# Patient Record
Sex: Female | Born: 1950 | Race: White | Hispanic: No | Smoking: Never smoker
Health system: Southern US, Community
[De-identification: ages and names within clinical notes are randomized; demographics above are authoritative.]

## PROBLEM LIST (undated history)

## (undated) DIAGNOSIS — F32A Depression, unspecified: Secondary | ICD-10-CM

## (undated) DIAGNOSIS — F419 Anxiety disorder, unspecified: Secondary | ICD-10-CM

## (undated) DIAGNOSIS — F329 Major depressive disorder, single episode, unspecified: Secondary | ICD-10-CM

## (undated) DIAGNOSIS — M1990 Unspecified osteoarthritis, unspecified site: Secondary | ICD-10-CM

## (undated) DIAGNOSIS — I10 Essential (primary) hypertension: Secondary | ICD-10-CM

## (undated) DIAGNOSIS — R7303 Prediabetes: Secondary | ICD-10-CM

## (undated) HISTORY — PX: HAND SURGERY: SHX662

## (undated) HISTORY — PX: OTHER SURGICAL HISTORY: SHX169

## (undated) HISTORY — PX: KNEE SURGERY: SHX244

---

## 2000-05-09 ENCOUNTER — Ambulatory Visit (HOSPITAL_COMMUNITY): Admission: RE | Admit: 2000-05-09 | Discharge: 2000-05-09 | Payer: Self-pay | Admitting: Pulmonary Disease

## 2000-05-28 ENCOUNTER — Ambulatory Visit (HOSPITAL_COMMUNITY): Admission: RE | Admit: 2000-05-28 | Discharge: 2000-05-28 | Payer: Self-pay | Admitting: Obstetrics and Gynecology

## 2000-05-28 ENCOUNTER — Encounter: Payer: Self-pay | Admitting: Obstetrics and Gynecology

## 2009-02-25 ENCOUNTER — Ambulatory Visit (HOSPITAL_COMMUNITY): Admission: RE | Admit: 2009-02-25 | Discharge: 2009-02-25 | Payer: Self-pay | Admitting: Ophthalmology

## 2009-03-11 ENCOUNTER — Ambulatory Visit (HOSPITAL_COMMUNITY): Admission: RE | Admit: 2009-03-11 | Discharge: 2009-03-11 | Payer: Self-pay | Admitting: Ophthalmology

## 2010-03-31 LAB — BASIC METABOLIC PANEL
BUN: 28 mg/dL — ABNORMAL HIGH (ref 6–23)
CO2: 27 mEq/L (ref 19–32)
Calcium: 10.1 mg/dL (ref 8.4–10.5)
Chloride: 101 mEq/L (ref 96–112)
Creatinine, Ser: 0.82 mg/dL (ref 0.4–1.2)
GFR calc Af Amer: >60 mL/min (ref >60)
GFR calc non Af Amer: >60 mL/min (ref >60)
Glucose, Bld: 93 mg/dL (ref 70–99)
Potassium: 4.4 mEq/L (ref 3.5–5.1)
Sodium: 136 mEq/L (ref 135–145)

## 2010-03-31 LAB — HEMOGLOBIN AND HEMATOCRIT, BLOOD
HCT: 35.2 % — ABNORMAL LOW (ref 36.0–46.0)
Hemoglobin: 12 g/dL (ref 12.0–15.0)

## 2010-04-22 ENCOUNTER — Other Ambulatory Visit (HOSPITAL_COMMUNITY): Payer: Self-pay | Admitting: Pulmonary Disease

## 2010-04-25 ENCOUNTER — Ambulatory Visit (HOSPITAL_COMMUNITY)
Admission: RE | Admit: 2010-04-25 | Discharge: 2010-04-25 | Disposition: A | Discharge status: Home / Self Care | Payer: BC Managed Care – PPO | Source: Ambulatory Visit | Attending: Pulmonary Disease | Admitting: Pulmonary Disease

## 2010-04-25 DIAGNOSIS — R109 Unspecified abdominal pain: Secondary | ICD-10-CM | POA: Insufficient documentation

## 2010-04-27 ENCOUNTER — Other Ambulatory Visit (HOSPITAL_COMMUNITY): Payer: Self-pay | Admitting: Pulmonary Disease

## 2010-04-27 DIAGNOSIS — R109 Unspecified abdominal pain: Secondary | ICD-10-CM

## 2010-04-29 ENCOUNTER — Encounter (HOSPITAL_COMMUNITY)
Admission: RE | Admit: 2010-04-29 | Discharge: 2010-04-29 | Disposition: A | Discharge status: Home / Self Care | Payer: BC Managed Care – PPO | Source: Ambulatory Visit | Attending: Pulmonary Disease | Admitting: Pulmonary Disease

## 2010-04-29 ENCOUNTER — Encounter (HOSPITAL_COMMUNITY): Payer: Self-pay

## 2010-04-29 DIAGNOSIS — R932 Abnormal findings on diagnostic imaging of liver and biliary tract: Secondary | ICD-10-CM | POA: Insufficient documentation

## 2010-04-29 DIAGNOSIS — R109 Unspecified abdominal pain: Secondary | ICD-10-CM | POA: Insufficient documentation

## 2010-04-29 HISTORY — DX: Essential (primary) hypertension: I10

## 2010-04-29 MED ORDER — TECHNETIUM TC 99M MEBROFENIN IV KIT
5.0000 | PACK | Freq: Once | INTRAVENOUS | Status: AC | PRN
  Administered 2010-04-29: 5 via INTRAVENOUS

## 2010-07-27 ENCOUNTER — Emergency Department (HOSPITAL_COMMUNITY)
Admission: EM | Admit: 2010-07-27 | Discharge: 2010-07-27 | Disposition: A | Discharge status: Home / Self Care | Payer: BC Managed Care – PPO | Attending: Emergency Medicine | Admitting: Emergency Medicine

## 2010-07-27 ENCOUNTER — Emergency Department (HOSPITAL_COMMUNITY): Payer: BC Managed Care – PPO

## 2010-07-27 ENCOUNTER — Encounter (HOSPITAL_COMMUNITY): Payer: Self-pay | Admitting: *Deleted

## 2010-07-27 DIAGNOSIS — S93402A Sprain of unspecified ligament of left ankle, initial encounter: Secondary | ICD-10-CM

## 2010-07-27 DIAGNOSIS — W108XXA Fall (on) (from) other stairs and steps, initial encounter: Secondary | ICD-10-CM | POA: Insufficient documentation

## 2010-07-27 DIAGNOSIS — F3289 Other specified depressive episodes: Secondary | ICD-10-CM | POA: Insufficient documentation

## 2010-07-27 DIAGNOSIS — Y92009 Unspecified place in unspecified non-institutional (private) residence as the place of occurrence of the external cause: Secondary | ICD-10-CM | POA: Insufficient documentation

## 2010-07-27 DIAGNOSIS — F329 Major depressive disorder, single episode, unspecified: Secondary | ICD-10-CM | POA: Insufficient documentation

## 2010-07-27 DIAGNOSIS — S93409A Sprain of unspecified ligament of unspecified ankle, initial encounter: Secondary | ICD-10-CM | POA: Insufficient documentation

## 2010-07-27 DIAGNOSIS — I10 Essential (primary) hypertension: Secondary | ICD-10-CM | POA: Insufficient documentation

## 2010-07-27 HISTORY — DX: Depression, unspecified: F32.A

## 2010-07-27 HISTORY — DX: Major depressive disorder, single episode, unspecified: F32.9

## 2010-07-27 NOTE — ED Provider Notes (Signed)
History     Chief Complaint  Patient presents with  . Ankle Pain   Patient is a 60 y.o. female presenting with ankle pain. The history is provided by the patient. The history is limited by a language barrier.  Ankle Pain  The incident occurred 1 to 2 hours ago. The incident occurred at home. The injury mechanism was torsion (She was walking backward down her porch steps trying to move a piece of furniture,  and tripped down the last three, causing inversion injury to her left ankle.). The pain is present in the left ankle. The pain is at a severity of 8/10. The pain is moderate. The pain has been constant since onset. Associated symptoms include inability to bear weight. Pertinent negatives include no numbness, no loss of motion, no loss of sensation and no tingling. The symptoms are aggravated by bearing weight and palpation. She has tried nothing for the symptoms.    Past Medical History  Diagnosis Date  . Hypertension   . Depression     Past Surgical History  Procedure Date  . Hand surgery     rt  . Knee surgery     rt  . Cataract surgery     bilateral    History reviewed. No pertinent family history.  History  Substance Use Topics  . Smoking status: Never Smoker   . Smokeless tobacco: Not on file  . Alcohol Use: No    OB History    Grav Para Term Preterm Abortions TAB SAB Ect Mult Living                  Review of Systems  Constitutional: Negative for fever.  HENT: Negative for congestion, sore throat and neck pain.   Eyes: Negative.   Respiratory: Negative for chest tightness and shortness of breath.   Cardiovascular: Negative for chest pain.  Gastrointestinal: Negative for nausea and abdominal pain.  Genitourinary: Negative.   Musculoskeletal: Positive for joint swelling and arthralgias.  Skin: Negative.  Negative for rash and wound.  Neurological: Negative for dizziness, tingling, weakness, light-headedness, numbness and headaches.  Hematological:  Negative.   Psychiatric/Behavioral: Negative.   All other systems reviewed and are negative.    Physical Exam  BP 124/83  Pulse 82  Temp(Src) 98.8 F (37.1 C) (Oral)  Resp 20  SpO2 97%  Physical Exam  Vitals reviewed. Constitutional: She is oriented to person, place, and time. She appears well-developed and well-nourished.  HENT:  Head: Normocephalic and atraumatic.  Eyes: Conjunctivae are normal.  Neck: Normal range of motion.  Cardiovascular: Normal rate, regular rhythm, normal heart sounds and intact distal pulses.   Pulmonary/Chest: Effort normal and breath sounds normal. She has no wheezes.  Abdominal: Soft. Bowel sounds are normal. There is no tenderness.  Musculoskeletal: She exhibits edema and tenderness.       Left ankle: She exhibits swelling. She exhibits no deformity, no laceration and normal pulse. tenderness. Lateral malleolus tenderness found. No head of 5th metatarsal and no proximal fibula tenderness found. Achilles tendon normal.  Neurological: She is alert and oriented to person, place, and time.  Skin: Skin is warm and dry.  Psychiatric: She has a normal mood and affect.    ED Course  Procedures  MDM No fracture.  STS consistent with sprain.  [place],  Per [employer] fund[employer].  Discussed crutches with pt - states has tried to use in the past and cannot use.  Will get cane or walker - has one can borrow  Medical screening examination/treatment/procedure(s) were conducted as a shared visit with non-physician practitioner(s) and myself.  I personally evaluated the patient during the encounter    Candis Musa, PA 07/27/10 2026  Candis Musa, PA 07/27/10 2034  Sunnie Nielsen, MD 07/30/10 [phone]

## 2010-07-27 NOTE — ED Notes (Signed)
Pt fell down the steps and injured her left ankle. Left ankle swollen and painful. Able to wiggle toes. Foot pink and warm.

## 2010-07-27 NOTE — ED Notes (Signed)
Ice pack given to pt.

## 2010-07-27 NOTE — ED Notes (Signed)
Pt was coming down steps, fell twisting her left ankle, pt c/o pain to left ankle and soreness to back area, cms intact to left foot, swelling noted to left ankle

## 2011-06-07 ENCOUNTER — Other Ambulatory Visit (HOSPITAL_COMMUNITY): Payer: Self-pay | Admitting: Pulmonary Disease

## 2011-06-07 DIAGNOSIS — K760 Fatty (change of) liver, not elsewhere classified: Secondary | ICD-10-CM

## 2011-06-14 ENCOUNTER — Ambulatory Visit (HOSPITAL_COMMUNITY)
Admission: RE | Admit: 2011-06-14 | Discharge: 2011-06-14 | Disposition: A | Discharge status: Home / Self Care | Payer: BC Managed Care – PPO | Source: Ambulatory Visit | Attending: Pulmonary Disease | Admitting: Pulmonary Disease

## 2011-06-14 DIAGNOSIS — K760 Fatty (change of) liver, not elsewhere classified: Secondary | ICD-10-CM

## 2011-06-14 DIAGNOSIS — K7689 Other specified diseases of liver: Secondary | ICD-10-CM | POA: Insufficient documentation

## 2013-09-24 ENCOUNTER — Ambulatory Visit (HOSPITAL_COMMUNITY)
Admission: RE | Admit: 2013-09-24 | Discharge: 2013-09-24 | Disposition: A | Discharge status: Home / Self Care | Payer: BC Managed Care – PPO | Source: Ambulatory Visit | Attending: Orthopedic Surgery | Admitting: Orthopedic Surgery

## 2013-09-24 DIAGNOSIS — I10 Essential (primary) hypertension: Secondary | ICD-10-CM | POA: Insufficient documentation

## 2013-09-24 DIAGNOSIS — M25512 Pain in left shoulder: Secondary | ICD-10-CM

## 2013-09-24 DIAGNOSIS — M6281 Muscle weakness (generalized): Secondary | ICD-10-CM | POA: Diagnosis not present

## 2013-09-24 DIAGNOSIS — IMO0001 Reserved for inherently not codable concepts without codable children: Secondary | ICD-10-CM | POA: Insufficient documentation

## 2013-09-24 DIAGNOSIS — M5030 Other cervical disc degeneration, unspecified cervical region: Secondary | ICD-10-CM | POA: Insufficient documentation

## 2013-09-24 DIAGNOSIS — M542 Cervicalgia: Secondary | ICD-10-CM | POA: Insufficient documentation

## 2013-09-24 DIAGNOSIS — M25519 Pain in unspecified shoulder: Secondary | ICD-10-CM | POA: Diagnosis not present

## 2013-09-24 DIAGNOSIS — R29898 Other symptoms and signs involving the musculoskeletal system: Secondary | ICD-10-CM

## 2013-09-24 NOTE — Evaluation (Signed)
Occupational Therapy Evaluation  Patient Details  Name: Linda Page MRN: [id number] Date of Birth: 1950/08/05  Today's Date: 09/24/2013 Time: 5621-3086 OT Time Calculation (min): 38 min OT eval: 1536-1614  Visit#: 1 of 12  Re-eval: 10/22/13  Assessment Diagnosis: Degenerative cervical disc with bilateral shoulder pain Next MD Visit: 10/09/2013   Past Medical History:  Past Medical History  Diagnosis Date  . Hypertension   . Depression    Past Surgical History:  Past Surgical History  Procedure Laterality Date  . Hand surgery      rt  . Knee surgery      rt  . Cataract surgery      bilateral    Subjective Symptoms/Limitations Symptoms: S: I just woke up one morning and there was pain in my neck and shoulders.  My left shoulder hurts a lot more than my right.  Pertinent History: Patient is a 63 y/o female diagnosed with degenerative cervical disc.  Patient reports she woke up one morning with pain in her neck and has had pain in her neck and shoulders ever since.  Patient reports she is ok when she first wakes up in the mornings but the pain gradually progresses throughout the day and is worse in the left shoulder and arm.  Patient reports she can only relieve the pain by laying flat on her back and takes pain medication.  Dr. Darrelyn Hillock referred patient to occupational therapy for evaluation and treatment.   Special Tests: FOTO 50/100 (50% impairment) Patient Stated Goals: To get rid of the pain and be able to do my normal activities comfortably.  Pain Assessment Currently in Pain?: Yes Pain Score: 5  Pain Location: Shoulder Pain Orientation: Left Pain Type: Acute pain Multiple Pain Sites: Yes  Precautions/Restrictions  Precautions Precautions: None  Balance Screening Balance Screen Has the patient fallen in the past 6 months: No Has the patient had a decrease in activity level because of a fear of falling? : No Is the patient reluctant to leave their home  because of a fear of falling? : No  Prior Functioning  Home Living Family/patient expects to be discharged to:: Private residence Living Arrangements: Alone Prior Function Level of Independence: Independent with basic ADLs  Able to Take Stairs?: Yes Driving: Yes Vocation: Full time employment Vocation Requirements: clerical work-computer tasks  Leisure: Hobbies-yes (Comment) Comments: work in yard, garden   Assessment ADL/Vision/Perception ADL ADL Comments: Pain progresses throughout the day-makes everything difficult Dominant Hand: Right Vision - History Baseline Vision: Wears glasses only for reading  Cognition/Observation Cognition Overall Cognitive Status: Within Functional Limits for tasks assessed Arousal/Alertness: Awake/alert Orientation Level: Oriented X4     Additional Assessments RUE AROM (degrees) Overall AROM Right Upper Extremity:  (Assessed in standing, IR/ER adducted ) Right Shoulder Flexion: 158 Degrees Right Shoulder ABduction: 160 Degrees Right Shoulder Internal Rotation: 74 Degrees Right Shoulder External Rotation: 74 Degrees LUE AROM (degrees) Overall AROM Left Upper Extremity:  (Assessed in standing, IR/ER adducted ) Left Shoulder Flexion: 146 Degrees Left Shoulder ABduction: 154 Degrees Left Shoulder Internal Rotation: 81 Degrees Left Shoulder External Rotation: 47 Degrees Cervical Assessment Cervical Assessment:  (Assessed seated ) Cervical AROM Cervical Flexion: 3.5 cm Cervical Extension: 2 cm Cervical - Right Side Bend: 13.7 cm Cervical - Left Side Bend: 12 cm Cervical - Right Rotation: 15.2 cm Cervical - Left Rotation: 17.5 cm Palpation Palpation: Patient has max fascial restrictions along cervical area, right and left upper arm, trapezius, and lateral border of bilateral of scapula  regions. Patient has increased tenderness with manual therapy.           Occupational Therapy Assessment and Plan OT Assessment and Plan Clinical  Impression Statement: A: Patient is a 63 y/o female diagnosed with degenerative cervical disc, presenting with severe pain in neck and left shoulder, occasional pain in right shoulder, increased fascial restrictions, decreased strength,  and decreased joint mobility resulting in difficulty completing daily activities and work tasks.. Pt will benefit from skilled therapeutic intervention in order to improve on the following deficits: Increased fascial restricitons;Pain;Impaired UE functional use;Decreased strength;Decreased range of motion Rehab Potential: Good OT Frequency: Min 2X/week OT Duration: 6 weeks OT Treatment/Interventions: Modalities;Therapeutic exercise;Therapeutic activities;Manual therapy;Patient/family education;Self-care/ADL training OT Plan: P: Pt will benefit from skilled OT interventions in order to decrease pain, increase cervical and bilateral UE ROM, increase strength, and increase BUE functional use. Treatment Plan:  MFR and manual stretching to cervical region and bilateral shoulders, focusing on shoulder blade area, scapular strengtheing and proximal stabilization, AROM, and BUE general strengthening to increase abiliity to complete daily and work tasks. Patient will be educated on good posture and positioning during work tasks.    Goals Short Term Goals Time to Complete Short Term Goals: 3 weeks Short Term Goal 1: Patient will be educated on HEP.  Short Term Goal 2: Patient will decrease fascial restrictions from max-mod.  Short Term Goal 3: Patient will decrease pain to 4/10 during daily and work tasks.   Short Term Goal 4: Patient will increase strength to bilateral shoulders to 4/5 to increase ability to work tasks.    Long Term Goals Time to Complete Long Term Goals: 6 weeks Long Term Goal 1: Patient will return to highest level of independence during daily and work tasks.   Long Term Goal 2: Patient will decrease fascial restrictions from mod-min amounts or less.   Long Term Goal 3: Patient will decrease pain to 2/10 or less during daily and work tasks.  Long Term Goal 4: Patient will increase AROM to WNL to increase ability to reach into overhead cabinets.  Long Term Goal 5: Patient will increase strength to bilateral shoulders 5/5 to increase ability to complete yard work.  Additional Long Term Goals?: Yes Long Term Goal 6: Patient will increase cervical ROM to WNL to increase ability to complete clerical work tasks.   Problem List Patient Active Problem List   Diagnosis Date Noted  . Degeneration of cervical intervertebral disc 09/24/2013  . Pain in joint, shoulder region 09/24/2013  . Muscle weakness (generalized) 09/24/2013  . Decreased range of motion of neck 09/24/2013    End of Session Activity Tolerance: Patient tolerated treatment well General Behavior During Therapy: [facility] for tasks assessed/performed OT Plan of Care OT Home Exercise Plan: neck and shoulder stretches OT Patient Instructions: handout (scanned) Consulted and Agree with Plan of Care: Patient     Ezra Sites. OT Student  09/24/2013, 6:02 PM  Physician Documentation Your signature is required to indicate approval of the treatment plan as stated above.  Please sign and either send electronically or make a copy of this report for your files and return this physician signed original.  Please mark one 1.__approve of plan  2. ___approve of plan with the following conditions.   ______________________________  _____________________ Physician Signature                                                                                                             Date

## 2013-09-24 NOTE — Evaluation (Signed)
Note reviewed by clinical instructor and accurately reflects treatment session.  Wyley Hack, OTR/L,CBIS   

## 2013-09-26 ENCOUNTER — Ambulatory Visit (HOSPITAL_COMMUNITY)
Admission: RE | Admit: 2013-09-26 | Discharge: 2013-09-26 | Disposition: A | Discharge status: Home / Self Care | Payer: BC Managed Care – PPO | Source: Ambulatory Visit | Attending: Orthopedic Surgery | Admitting: Orthopedic Surgery

## 2013-09-26 DIAGNOSIS — IMO0001 Reserved for inherently not codable concepts without codable children: Secondary | ICD-10-CM | POA: Diagnosis not present

## 2013-09-26 NOTE — Progress Notes (Signed)
Occupational Therapy Treatment Patient Details  Name: Linda Page MRN: [id number] Date of Birth: 11/03/1950  Today's Date: 09/26/2013 Time: 4098-1191 OT Time Calculation (min): 40 min Manual 40'  Visit#: 2 of 12  Re-eval: 10/22/13    Authorization:    Authorization Time Period:    Authorization Visit#:   of    Subjective Symptoms/Limitations Symptoms: 'Its just hurting so bad today. its always hurting at work."   Exercise/Treatments Seated Elevation: AROM;10 reps Extension: AROM;10 reps Row: AROM;10 reps     Modalities Modalities: Moist Heat Manual Therapy Manual Therapy: Myofascial release Myofascial Release: Myofascial release (MFR) and manual strretching to BUE bicep, upper arm, upper trap, scapular, and cervical neck regions to decrease pain and tightness.  Manual cervical traction. Moist Heat Therapy Number Minutes Moist Heat: 20 Minutes Moist Heat Location: Shoulder (heat pack to L shoulder to decrease pain prior to MFR, while preforming MFR on RUE and cervical neck region)  Occupational Therapy Assessment and Plan OT Assessment and Plan Clinical Impression Statement: Initiated MFR this session.  Pt with max fascail restrictions in bilateral shoulders, cervical neck, and bilateral scapular regions.  Provided heat pack to L shoulder during MFR to RUE in order to decrease pain and tightness prior to attempting MFR to LUE.  Pt had increased pain with MFR, but verbalized haveing some decresed pain at end of session.  Pt completed shoulder elevation, extension, and row after MFR, with increased pain.  Educated pt on completing each exercise to the point of a good stretch with significant increase in pain. OT Plan: continue MFR to BUE, B scap, and B cervical neck regions.  Heat pack to LUE as needed.  Add PROM to BUE.  Review cervical neck stretches.   Goals Short Term Goals Short Term Goal 1: Patient will be educated on HEP.  Short Term Goal 1 Progress: Progressing  toward goal Short Term Goal 2: Patient will decrease fascial restrictions from max-mod.  Short Term Goal 2 Progress: Progressing toward goal Short Term Goal 3: Patient will decrease pain to 4/10 during daily and work tasks.   Short Term Goal 3 Progress: Progressing toward goal Short Term Goal 4: Patient will increase strength to bilateral shoulders to 4/5 to increase ability to work tasks.    Short Term Goal 4 Progress: Progressing toward goal Long Term Goals Long Term Goal 1: Patient will return to highest level of independence during daily and work tasks.   Long Term Goal 1 Progress: Progressing toward goal Long Term Goal 2: Patient will decrease fascial restrictions from mod-min amounts or less.  Long Term Goal 2 Progress: Progressing toward goal Long Term Goal 3: Patient will decrease pain to 2/10 or less during daily and work tasks.  Long Term Goal 3 Progress: Progressing toward goal Long Term Goal 4: Patient will increase AROM to WNL to increase ability to reach into overhead cabinets.  Long Term Goal 4 Progress: Progressing toward goal Long Term Goal 5: Patient will increase strength to bilateral shoulders 5/5 to increase ability to complete yard work.  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  Long Term Goal 6 Progress: Progressing toward goal  Problem List Patient Active Problem List   Diagnosis Date Noted  . Degeneration of cervical intervertebral disc 09/24/2013  . Pain in joint, shoulder region 09/24/2013  . Muscle weakness (generalized) 09/24/2013  .  Decreased range of motion of neck 09/24/2013    End of Session Activity Tolerance: Patient tolerated treatment well General Behavior During Therapy: [facility] for tasks assessed/performed  GO    Marry Guan, MS, OTR/L [phone]  09/26/2013, 4:20 PM

## 2013-09-29 ENCOUNTER — Ambulatory Visit (HOSPITAL_COMMUNITY)
Admission: RE | Admit: 2013-09-29 | Discharge: 2013-09-29 | Disposition: A | Discharge status: Home / Self Care | Payer: BC Managed Care – PPO | Source: Ambulatory Visit | Attending: Pulmonary Disease | Admitting: Pulmonary Disease

## 2013-09-29 DIAGNOSIS — IMO0001 Reserved for inherently not codable concepts without codable children: Secondary | ICD-10-CM | POA: Diagnosis not present

## 2013-09-29 NOTE — Progress Notes (Signed)
Occupational Therapy Treatment Patient Details  Name: Linda Page MRN: [id number] Date of Birth: 1950/02/09  Today's Date: 09/29/2013 Time: 1021-1105 OT Time Calculation (min): 44 min Manual 1021-1055 (34') Therapeutic Exercises 1055-1105 (10') Heat pack at end of session 10'  Visit#: 3 of 12  Re-eval: 10/22/13    Authorization:    Authorization Time Period:    Authorization Visit#:   of    Subjective Symptoms/Limitations Symptoms: "I'm just sore. Just very very sore. I started suing it again at work this morning, and it started hurting again. i twas better over the weekend because I wasn't doing anything." Pain Assessment Currently in Pain?: Yes Pain Score: 5  Pain Location: Shoulder Pain Orientation: Left Pain Type: Acute pain  Exercise/Treatments Supine Protraction: PROM;Both;5 reps Horizontal ABduction: PROM;Both;5 reps External Rotation: PROM;Both;5 reps Internal Rotation: PROM;Both;5 reps Flexion: PROM;Both;5 reps ABduction: PROM;Both;5 reps Seated Elevation: AROM;10 reps Extension: AROM;10 reps Row: AROM;10 reps      Manual Therapy Manual Therapy: Myofascial release Myofascial Release: Myofascial release (MFR) and manual strretching to BUE bicep, upper arm, upper trap, scapular, and cervical neck regions to decrease pain and tightness. Manual cervical traction.   Moist Heat Therapy Number Minutes Moist Heat: 10 Minutes Moist Heat Location: Shoulder  Occupational Therapy Assessment and Plan OT Assessment and Plan Clinical Impression Statement: continued MFR this session, focusing on LUE this date.  pt had improved tolerance of MFR this session over rpevious Friday - pt indicated she had been able to rest over the weekend. Added PROM bilaterally to shoulders.  Pt had increased pain with LUE, but tolereated well RUE. Completed cervical neck stretchs in sitting - pt tolerated well, but with pain. OT Plan: Heat pack to LUE as needed. Begin MFR at neck, and  progress to LUE. Add upper trap and levator scapulae stretch.   Goals Short Term Goals Short Term Goal 1: Patient will be educated on HEP.  Short Term Goal 1 Progress: Progressing toward goal Short Term Goal 2: Patient will decrease fascial restrictions from max-mod.  Short Term Goal 2 Progress: Progressing toward goal Short Term Goal 3: Patient will decrease pain to 4/10 during daily and work tasks.   Short Term Goal 3 Progress: Progressing toward goal Short Term Goal 4: Patient will increase strength to bilateral shoulders to 4/5 to increase ability to work tasks.    Short Term Goal 4 Progress: Progressing toward goal Long Term Goals Long Term Goal 1: Patient will return to highest level of independence during daily and work tasks.   Long Term Goal 1 Progress: Progressing toward goal Long Term Goal 2: Patient will decrease fascial restrictions from mod-min amounts or less.  Long Term Goal 2 Progress: Progressing toward goal Long Term Goal 3: Patient will decrease pain to 2/10 or less during daily and work tasks.  Long Term Goal 3 Progress: Progressing toward goal Long Term Goal 4: Patient will increase AROM to WNL to increase ability to reach into overhead cabinets.  Long Term Goal 4 Progress: Progressing toward goal Long Term Goal 5: Patient will increase strength to bilateral shoulders 5/5 to increase ability to complete yard work.  Long Term Goal 5 Progress: Progressing toward goal Long Term Goal 6: Patient will increase cervical ROM to WNL to increase ability to complete clerical work tasks.  Long Term Goal 6 Progress: Progressing toward goal  Problem List Patient Active Problem List   Diagnosis Date Noted  . Degeneration of cervical intervertebral disc 09/24/2013  . Pain in joint,  shoulder region 09/24/2013  . Muscle weakness (generalized) 09/24/2013  . Decreased range of motion of neck 09/24/2013    End of Session Activity Tolerance: Patient tolerated treatment  well General Behavior During Therapy: [facility] for tasks assessed/performed  GO    Marry Guan, MS, OTR/L [phone]  09/29/2013, 12:14 PM

## 2013-10-03 ENCOUNTER — Ambulatory Visit (HOSPITAL_COMMUNITY): Payer: BC Managed Care – PPO

## 2013-10-20 ENCOUNTER — Telehealth (HOSPITAL_COMMUNITY): Payer: Self-pay

## 2013-10-20 NOTE — Telephone Encounter (Signed)
Called and left a message to see if she needed to make more appointments to please call and let us know

## 2013-10-27 ENCOUNTER — Telehealth (HOSPITAL_COMMUNITY): Payer: Self-pay

## 2013-10-27 NOTE — Telephone Encounter (Signed)
Date: 10/27/13  Left message for patient to call us to schedule additional appts or let us know if she'd like to be discharged.   Limmie PatriciaLaura Daneille Desilva, OTR/L,CBIS

## 2013-11-04 NOTE — Addendum Note (Signed)
Encounter addended by: Jeanene ErbLaura D Juliett Eastburn, OT on: 11/04/2013  9:58 AM<BR>     Documentation filed: Episodes, Clinical Notes, Letters

## 2013-11-04 NOTE — Progress Notes (Signed)
  Patient Details  Name: Linda Page MRN: [id number] Date of Birth: 04-Feb-1950  Today's Date: 11/04/2013  The above patient was discharged from OT on 11/04/2013 secondary to failure to return to clinic since 09/29/2013.  Limmie PatriciaLaura Essenmacher, OTR/L,CBIS   11/04/2013, 9:55 AM

## 2013-11-06 ENCOUNTER — Telehealth (HOSPITAL_COMMUNITY): Payer: Self-pay

## 2013-11-06 NOTE — Telephone Encounter (Signed)
Called to see if she needed to schedule more appointments

## 2015-04-13 ENCOUNTER — Ambulatory Visit: Payer: Self-pay | Admitting: Physician Assistant

## 2015-04-19 NOTE — Pre-Procedure Instructions (Signed)
    Orlena SheldonDebra C Saputo  04/19/2015      [facility] - [place], [place] - [address] [place] [id number] Phone: [phone] Fax: [phone]    Your procedure is scheduled on Thursday April 20th   [facility] at 8:50 am  Call this number if you have problems the morning of surgery: [phone]  If needed prior to surgery date call [phone] between 8am and 4 pm   Remember:  Do not eat food or drink liquids after midnight.  Take these medicines the morning of surgery with A SIP OF WATER : Tylenol, xanax if needed, Zoloft  Stop taking any aspirin or aspirin containing products, anti-inflammatories such as Ibuprofen, Naproxen, Aleve, Advil, Motrin, vitamins and supplements   Do not wear jewelry, make-up or nail polish.  Do not wear lotions, powders, or perfumes.  You may not wear deodorant.  Do not shave 48 hours prior to surgery.    Do not bring valuables to the hospital.  [facility] is not responsible for any belongings or valuables.  Contacts, dentures or bridgework may not be worn into surgery.  Leave your suitcase in the car.  After surgery it may be brought to your room.  Special instructions:  Shower with CHG as instructed the night before and morning of surgery  Please read over the following fact sheets that you were given. Pain Booklet, Coughing and Deep Breathing, MRSA Information and Surgical Site Infection Prevention

## 2015-04-20 ENCOUNTER — Encounter (HOSPITAL_COMMUNITY): Payer: Self-pay

## 2015-04-20 ENCOUNTER — Encounter (HOSPITAL_COMMUNITY)
Admission: RE | Admit: 2015-04-20 | Discharge: 2015-04-20 | Disposition: A | Discharge status: Home / Self Care | Payer: BC Managed Care – PPO | Source: Ambulatory Visit | Attending: Orthopedic Surgery | Admitting: Orthopedic Surgery

## 2015-04-20 DIAGNOSIS — M4722 Other spondylosis with radiculopathy, cervical region: Secondary | ICD-10-CM | POA: Insufficient documentation

## 2015-04-20 DIAGNOSIS — Z01812 Encounter for preprocedural laboratory examination: Secondary | ICD-10-CM | POA: Insufficient documentation

## 2015-04-20 DIAGNOSIS — Z01818 Encounter for other preprocedural examination: Secondary | ICD-10-CM | POA: Insufficient documentation

## 2015-04-20 HISTORY — DX: Unspecified osteoarthritis, unspecified site: M19.90

## 2015-04-20 LAB — CBC
HEMATOCRIT: 35.4 % — AB (ref 36.0–46.0)
HEMOGLOBIN: 11.8 g/dL — AB (ref 12.0–15.0)
MCH: 30.6 pg (ref 26.0–34.0)
MCHC: 33.3 g/dL (ref 30.0–36.0)
MCV: 91.9 fL (ref 78.0–100.0)
Platelets: 298 10*3/uL (ref 150–400)
RBC: 3.85 MIL/uL — ABNORMAL LOW (ref 3.87–5.11)
RDW: 12.8 % (ref 11.5–15.5)
WBC: 6.1 10*3/uL (ref 4.0–10.5)

## 2015-04-20 LAB — BASIC METABOLIC PANEL
Anion gap: 11 (ref 5–15)
BUN: 30 mg/dL — ABNORMAL HIGH (ref 6–20)
CHLORIDE: 103 mmol/L (ref 101–111)
CO2: 27 mmol/L (ref 22–32)
CREATININE: 0.97 mg/dL (ref 0.44–1.00)
Calcium: 9.8 mg/dL (ref 8.9–10.3)
GFR calc non Af Amer: >60 mL/min (ref >60)
GLUCOSE: 94 mg/dL (ref 65–99)
Potassium: 3.9 mmol/L (ref 3.5–5.1)
Sodium: 141 mmol/L (ref 135–145)

## 2015-04-20 LAB — SURGICAL PCR SCREEN
MRSA, PCR: NEGATIVE
STAPHYLOCOCCUS AUREUS: NEGATIVE

## 2015-04-20 NOTE — Progress Notes (Signed)
Pt denies heart history other than hypertension. No cardiologist or heart studies.  [place]be [place]

## 2015-04-29 ENCOUNTER — Ambulatory Visit (HOSPITAL_COMMUNITY): Payer: BC Managed Care – PPO

## 2015-04-29 ENCOUNTER — Observation Stay (HOSPITAL_COMMUNITY)
Admission: RE | Admit: 2015-04-29 | Discharge: 2015-04-30 | Disposition: A | Discharge status: Home / Self Care | Payer: BC Managed Care – PPO | Source: Ambulatory Visit | Attending: Orthopedic Surgery | Admitting: Orthopedic Surgery

## 2015-04-29 ENCOUNTER — Ambulatory Visit (HOSPITAL_COMMUNITY): Payer: BC Managed Care – PPO | Admitting: Anesthesiology

## 2015-04-29 ENCOUNTER — Encounter (HOSPITAL_COMMUNITY): Payer: Self-pay | Admitting: Surgery

## 2015-04-29 ENCOUNTER — Encounter (HOSPITAL_COMMUNITY)
Admission: RE | Disposition: A | Discharge status: Home / Self Care | Payer: Self-pay | Source: Ambulatory Visit | Attending: Orthopedic Surgery

## 2015-04-29 DIAGNOSIS — M4682 Other specified inflammatory spondylopathies, cervical region: Secondary | ICD-10-CM | POA: Diagnosis not present

## 2015-04-29 DIAGNOSIS — I10 Essential (primary) hypertension: Secondary | ICD-10-CM | POA: Diagnosis not present

## 2015-04-29 DIAGNOSIS — M50121 Cervical disc disorder at C4-C5 level with radiculopathy: Principal | ICD-10-CM | POA: Insufficient documentation

## 2015-04-29 DIAGNOSIS — Z79899 Other long term (current) drug therapy: Secondary | ICD-10-CM | POA: Diagnosis not present

## 2015-04-29 DIAGNOSIS — Z419 Encounter for procedure for purposes other than remedying health state, unspecified: Secondary | ICD-10-CM

## 2015-04-29 DIAGNOSIS — M542 Cervicalgia: Secondary | ICD-10-CM | POA: Diagnosis present

## 2015-04-29 DIAGNOSIS — Z981 Arthrodesis status: Secondary | ICD-10-CM

## 2015-04-29 HISTORY — PX: ANTERIOR CERVICAL DECOMP/DISCECTOMY FUSION: SHX1161

## 2015-04-29 SURGERY — ANTERIOR CERVICAL DECOMPRESSION/DISCECTOMY FUSION 3 LEVELS
Anesthesia: General

## 2015-04-29 MED ORDER — HYDROMORPHONE HCL 1 MG/ML IJ SOLN: 0.2500 mg | INTRAMUSCULAR | Status: DC | PRN

## 2015-04-29 MED ORDER — ROCURONIUM BROMIDE 50 MG/5ML IV SOLN
INTRAVENOUS | Status: AC
  Filled 2015-04-29: qty 1

## 2015-04-29 MED ORDER — ONDANSETRON HCL 4 MG/2ML IJ SOLN
INTRAMUSCULAR | Status: DC | PRN
  Administered 2015-04-29: 4 mg via INTRAVENOUS

## 2015-04-29 MED ORDER — BUPIVACAINE-EPINEPHRINE 0.25% -1:200000 IJ SOLN
INTRAMUSCULAR | Status: DC | PRN
  Administered 2015-04-29: 10 mL

## 2015-04-29 MED ORDER — DEXAMETHASONE 4 MG PO TABS: 4.0000 mg | ORAL_TABLET | Freq: Four times a day (QID) | ORAL | Status: DC

## 2015-04-29 MED ORDER — THROMBIN 20000 UNITS EX SOLR
CUTANEOUS | Status: AC
  Filled 2015-04-29: qty 20000

## 2015-04-29 MED ORDER — ONDANSETRON HCL 4 MG PO TABS: 4.0000 mg | ORAL_TABLET | Freq: Three times a day (TID) | ORAL | Status: DC | PRN

## 2015-04-29 MED ORDER — METHOCARBAMOL 500 MG PO TABS
500.0000 mg | ORAL_TABLET | Freq: Three times a day (TID) | ORAL | Status: DC | PRN
Start: 2015-04-29 — End: 2018-08-12

## 2015-04-29 MED ORDER — MENTHOL 3 MG MT LOZG: 1.0000 | LOZENGE | OROMUCOSAL | Status: DC | PRN

## 2015-04-29 MED ORDER — SODIUM CHLORIDE 0.9 % IV SOLN: 250.0000 mL | INTRAVENOUS | Status: DC

## 2015-04-29 MED ORDER — MIDAZOLAM HCL 2 MG/2ML IJ SOLN
INTRAMUSCULAR | Status: AC
  Filled 2015-04-29: qty 2

## 2015-04-29 MED ORDER — EPHEDRINE SULFATE 50 MG/ML IJ SOLN
INTRAMUSCULAR | Status: DC | PRN
  Administered 2015-04-29: 10 mg via INTRAVENOUS

## 2015-04-29 MED ORDER — METHOCARBAMOL 500 MG PO TABS
500.0000 mg | ORAL_TABLET | Freq: Four times a day (QID) | ORAL | Status: DC | PRN
  Administered 2015-04-30: 500 mg via ORAL
  Filled 2015-04-29 (×2): qty 1

## 2015-04-29 MED ORDER — 0.9 % SODIUM CHLORIDE (POUR BTL) OPTIME
TOPICAL | Status: DC | PRN
  Administered 2015-04-29: 1000 mL

## 2015-04-29 MED ORDER — DEXAMETHASONE SODIUM PHOSPHATE 10 MG/ML IJ SOLN
INTRAMUSCULAR | Status: DC | PRN
  Administered 2015-04-29: 8 mg via INTRAVENOUS

## 2015-04-29 MED ORDER — ACETAMINOPHEN 10 MG/ML IV SOLN
INTRAVENOUS | Status: AC
  Filled 2015-04-29: qty 100

## 2015-04-29 MED ORDER — HEMOSTATIC AGENTS (NO CHARGE) OPTIME
TOPICAL | Status: DC | PRN
  Administered 2015-04-29 (×2): 1 via TOPICAL

## 2015-04-29 MED ORDER — LACTATED RINGERS IV SOLN
INTRAVENOUS | Status: DC
  Administered 2015-04-29: 10:00:00 via INTRAVENOUS

## 2015-04-29 MED ORDER — ARTIFICIAL TEARS OP OINT
TOPICAL_OINTMENT | OPHTHALMIC | Status: DC | PRN
  Administered 2015-04-29: 1 via OPHTHALMIC

## 2015-04-29 MED ORDER — ONDANSETRON HCL 4 MG/2ML IJ SOLN: 4.0000 mg | INTRAMUSCULAR | Status: DC | PRN

## 2015-04-29 MED ORDER — ROCURONIUM BROMIDE 100 MG/10ML IV SOLN
INTRAVENOUS | Status: DC | PRN
  Administered 2015-04-29 (×3): 10 mg via INTRAVENOUS
  Administered 2015-04-29: 50 mg via INTRAVENOUS
  Administered 2015-04-29 (×4): 10 mg via INTRAVENOUS

## 2015-04-29 MED ORDER — DEXMEDETOMIDINE HCL IN NACL 200 MCG/50ML IV SOLN
INTRAVENOUS | Status: AC
  Filled 2015-04-29: qty 50

## 2015-04-29 MED ORDER — FENTANYL CITRATE (PF) 250 MCG/5ML IJ SOLN
INTRAMUSCULAR | Status: AC
  Filled 2015-04-29: qty 5

## 2015-04-29 MED ORDER — THROMBIN 20000 UNITS EX KIT
PACK | CUTANEOUS | Status: DC | PRN
Start: 2015-04-29 — End: 2015-04-29
  Administered 2015-04-29: 20000 [IU] via TOPICAL

## 2015-04-29 MED ORDER — LACTATED RINGERS IV SOLN: INTRAVENOUS | Status: DC

## 2015-04-29 MED ORDER — ACETAMINOPHEN 10 MG/ML IV SOLN
INTRAVENOUS | Status: DC | PRN
  Administered 2015-04-29: 1000 mg via INTRAVENOUS

## 2015-04-29 MED ORDER — MORPHINE SULFATE (PF) 2 MG/ML IV SOLN
1.0000 mg | INTRAVENOUS | Status: DC | PRN
  Administered 2015-04-29 – 2015-04-30 (×3): 2 mg via INTRAVENOUS
  Filled 2015-04-29 (×3): qty 1

## 2015-04-29 MED ORDER — SODIUM CHLORIDE 0.9% FLUSH: 3.0000 mL | INTRAVENOUS | Status: DC | PRN

## 2015-04-29 MED ORDER — CEFAZOLIN SODIUM-DEXTROSE 2-4 GM/100ML-% IV SOLN
2.0000 g | INTRAVENOUS | Status: AC
  Administered 2015-04-29 (×2): 2 g via INTRAVENOUS
  Filled 2015-04-29: qty 100

## 2015-04-29 MED ORDER — LIDOCAINE HCL (CARDIAC) 20 MG/ML IV SOLN
INTRAVENOUS | Status: DC | PRN
  Administered 2015-04-29: 60 mg via INTRAVENOUS

## 2015-04-29 MED ORDER — SUGAMMADEX SODIUM 200 MG/2ML IV SOLN
INTRAVENOUS | Status: DC | PRN
  Administered 2015-04-29: 200 mg via INTRAVENOUS

## 2015-04-29 MED ORDER — LISINOPRIL 20 MG PO TABS
20.0000 mg | ORAL_TABLET | Freq: Every day | ORAL | Status: DC
  Administered 2015-04-29: 20 mg via ORAL
  Filled 2015-04-29: qty 1

## 2015-04-29 MED ORDER — FENTANYL CITRATE (PF) 100 MCG/2ML IJ SOLN
INTRAMUSCULAR | Status: DC | PRN
  Administered 2015-04-29 (×4): 50 ug via INTRAVENOUS
  Administered 2015-04-29: 100 ug via INTRAVENOUS
  Administered 2015-04-29 (×3): 50 ug via INTRAVENOUS

## 2015-04-29 MED ORDER — DEXAMETHASONE SODIUM PHOSPHATE 4 MG/ML IJ SOLN
4.0000 mg | Freq: Four times a day (QID) | INTRAMUSCULAR | Status: DC
  Administered 2015-04-29 – 2015-04-30 (×2): 4 mg via INTRAVENOUS
  Filled 2015-04-29 (×2): qty 1

## 2015-04-29 MED ORDER — LISINOPRIL-HYDROCHLOROTHIAZIDE 20-12.5 MG PO TABS: 1.0000 | ORAL_TABLET | Freq: Every day | ORAL | Status: DC

## 2015-04-29 MED ORDER — SODIUM CHLORIDE 0.9% FLUSH
3.0000 mL | Freq: Two times a day (BID) | INTRAVENOUS | Status: DC
  Administered 2015-04-29: 3 mL via INTRAVENOUS

## 2015-04-29 MED ORDER — MIDAZOLAM HCL 5 MG/5ML IJ SOLN
INTRAMUSCULAR | Status: DC | PRN
  Administered 2015-04-29: 2 mg via INTRAVENOUS

## 2015-04-29 MED ORDER — LACTATED RINGERS IV SOLN
INTRAVENOUS | Status: DC | PRN
  Administered 2015-04-29 (×3): via INTRAVENOUS

## 2015-04-29 MED ORDER — METHOCARBAMOL 1000 MG/10ML IJ SOLN
500.0000 mg | Freq: Four times a day (QID) | INTRAVENOUS | Status: DC | PRN
  Administered 2015-04-29: 500 mg via INTRAVENOUS
  Filled 2015-04-29 (×3): qty 5

## 2015-04-29 MED ORDER — PROPOFOL 10 MG/ML IV BOLUS
INTRAVENOUS | Status: AC
  Filled 2015-04-29: qty 20

## 2015-04-29 MED ORDER — CEFAZOLIN SODIUM 1-5 GM-% IV SOLN
1.0000 g | Freq: Three times a day (TID) | INTRAVENOUS | Status: AC
  Administered 2015-04-29 – 2015-04-30 (×2): 1 g via INTRAVENOUS
  Filled 2015-04-29 (×2): qty 50

## 2015-04-29 MED ORDER — PHENOL 1.4 % MT LIQD: 1.0000 | OROMUCOSAL | Status: DC | PRN

## 2015-04-29 MED ORDER — CEFAZOLIN SODIUM 1 G IJ SOLR
INTRAMUSCULAR | Status: AC
  Filled 2015-04-29: qty 10

## 2015-04-29 MED ORDER — ALPRAZOLAM 0.5 MG PO TABS
0.5000 mg | ORAL_TABLET | Freq: Three times a day (TID) | ORAL | Status: DC | PRN
  Administered 2015-04-29: 0.5 mg via ORAL
  Filled 2015-04-29: qty 1

## 2015-04-29 MED ORDER — DEXMEDETOMIDINE HCL 200 MCG/2ML IV SOLN
INTRAVENOUS | Status: DC | PRN
  Administered 2015-04-29 (×7): 4 ug via INTRAVENOUS

## 2015-04-29 MED ORDER — PROPOFOL 10 MG/ML IV BOLUS
INTRAVENOUS | Status: DC | PRN
  Administered 2015-04-29: 150 mg via INTRAVENOUS

## 2015-04-29 MED ORDER — DEXAMETHASONE SODIUM PHOSPHATE 4 MG/ML IJ SOLN
INTRAMUSCULAR | Status: AC
  Filled 2015-04-29: qty 2

## 2015-04-29 MED ORDER — OXYCODONE-ACETAMINOPHEN 10-325 MG PO TABS: 1.0000 | ORAL_TABLET | ORAL | Status: DC | PRN

## 2015-04-29 MED ORDER — HYDROCHLOROTHIAZIDE 12.5 MG PO CAPS
12.5000 mg | ORAL_CAPSULE | Freq: Every day | ORAL | Status: DC
  Administered 2015-04-29: 12.5 mg via ORAL
  Filled 2015-04-29: qty 1

## 2015-04-29 MED ORDER — BUPIVACAINE-EPINEPHRINE (PF) 0.25% -1:200000 IJ SOLN
INTRAMUSCULAR | Status: AC
  Filled 2015-04-29: qty 30

## 2015-04-29 MED ORDER — SERTRALINE HCL 50 MG PO TABS
100.0000 mg | ORAL_TABLET | Freq: Every day | ORAL | Status: DC
  Administered 2015-04-29: 100 mg via ORAL
  Filled 2015-04-29: qty 2

## 2015-04-29 SURGICAL SUPPLY — 76 items
BLADE SURG ROTATE 9660 (MISCELLANEOUS) IMPLANT
BUR EGG ELITE 4.0 (BURR) IMPLANT
BUR EGG ELITE 4.0MM (BURR)
BUR MATCHSTICK NEURO 3.0 LAGG (BURR) IMPLANT
CANISTER SUCTION 2500CC (MISCELLANEOUS) ×3 IMPLANT
CLOSURE STERI-STRIP 1/2X4 (GAUZE/BANDAGES/DRESSINGS) ×1
CLSR STERI-STRIP ANTIMIC 1/2X4 (GAUZE/BANDAGES/DRESSINGS) ×2 IMPLANT
CORDS BIPOLAR (ELECTRODE) ×3 IMPLANT
COVER SURGICAL LIGHT HANDLE (MISCELLANEOUS) ×6 IMPLANT
CRADLE DONUT ADULT HEAD (MISCELLANEOUS) ×3 IMPLANT
DEVICE ENDSKLTN IMPLNT MED 6MM (Orthopedic Implant) ×2 IMPLANT
DRAPE C-ARM 42X72 X-RAY (DRAPES) ×3 IMPLANT
DRAPE POUCH INSTRU U-SHP 10X18 (DRAPES) ×3 IMPLANT
DRAPE SURG 17X23 STRL (DRAPES) ×3 IMPLANT
DRAPE U-SHAPE 47X51 STRL (DRAPES) ×3 IMPLANT
DRILL BIT SWIFT PLUS 12MM (BIT) ×3 IMPLANT
DRSG AQUACEL AG ADV 3.5X 6 (GAUZE/BANDAGES/DRESSINGS) ×3 IMPLANT
DRSG MEPILEX BORDER 4X8 (GAUZE/BANDAGES/DRESSINGS) ×3 IMPLANT
DURAPREP 6ML APPLICATOR 50/CS (WOUND CARE) ×3 IMPLANT
ELECT COATED BLADE 2.86 ST (ELECTRODE) ×3 IMPLANT
ELECT PENCIL ROCKER SW 15FT (MISCELLANEOUS) ×3 IMPLANT
ELECT REM PT RETURN 9FT ADLT (ELECTROSURGICAL) ×3
ELECTRODE REM PT RTRN 9FT ADLT (ELECTROSURGICAL) ×1 IMPLANT
ENDOSKELETON IMPLANT MED 6MM (Orthopedic Implant) ×6 IMPLANT
ENDOSKELETON LG CERVIC TC 6MM (Orthopedic Implant) ×3 IMPLANT
GLOVE BIO SURGEON STRL SZ 6.5 (GLOVE) ×2 IMPLANT
GLOVE BIO SURGEONS STRL SZ 6.5 (GLOVE) ×1
GLOVE BIOGEL PI IND STRL 6.5 (GLOVE) ×2 IMPLANT
GLOVE BIOGEL PI IND STRL 7.5 (GLOVE) ×2 IMPLANT
GLOVE BIOGEL PI IND STRL 8.5 (GLOVE) ×1 IMPLANT
GLOVE BIOGEL PI INDICATOR 6.5 (GLOVE) ×4
GLOVE BIOGEL PI INDICATOR 7.5 (GLOVE) ×4
GLOVE BIOGEL PI INDICATOR 8.5 (GLOVE) ×2
GLOVE SS BIOGEL STRL SZ 8.5 (GLOVE) ×1 IMPLANT
GLOVE SUPERSENSE BIOGEL SZ 8.5 (GLOVE) ×2
GOWN STRL REUS W/ TWL XL LVL3 (GOWN DISPOSABLE) ×2 IMPLANT
GOWN STRL REUS W/TWL 2XL LVL3 (GOWN DISPOSABLE) ×6 IMPLANT
GOWN STRL REUS W/TWL XL LVL3 (GOWN DISPOSABLE) ×4
KIT BASIN OR (CUSTOM PROCEDURE TRAY) ×3 IMPLANT
KIT ROOM TURNOVER OR (KITS) ×3 IMPLANT
NEEDLE SPNL 18GX3.5 QUINCKE PK (NEEDLE) ×3 IMPLANT
NS IRRIG 1000ML POUR BTL (IV SOLUTION) ×3 IMPLANT
PACK ORTHO CERVICAL (CUSTOM PROCEDURE TRAY) ×3 IMPLANT
PACK UNIVERSAL I (CUSTOM PROCEDURE TRAY) ×3 IMPLANT
PAD ARMBOARD 7.5X6 YLW CONV (MISCELLANEOUS) ×9 IMPLANT
PATTIES SURGICAL .25X.25 (GAUZE/BANDAGES/DRESSINGS) ×3 IMPLANT
PATTIES SURGICAL .5 X.5 (GAUZE/BANDAGES/DRESSINGS) IMPLANT
PIN DISTRACTION 14 (PIN) ×3 IMPLANT
PIN FIXATION TEMP SWIFT PLUS (PIN) ×3 IMPLANT
PLATE SWIFT 3LVL 51MM SPINE (Plate) ×3 IMPLANT
PUTTY BONE DBX 5CC MIX (Putty) ×3 IMPLANT
RESTRAINT LIMB HOLDER UNIV (RESTRAINTS) ×3 IMPLANT
SCREW SD-VA 14M SWIFT PLUS (Screw) ×12 IMPLANT
SCREW SWIFT SD-VA 12MM (Screw) ×12 IMPLANT
SPACER SWIFT CLIP ANT CERVICAL (Spacer) ×9 IMPLANT
SPONGE INTESTINAL PEANUT (DISPOSABLE) ×6 IMPLANT
SPONGE LAP 4X18 X RAY DECT (DISPOSABLE) ×6 IMPLANT
SPONGE SURGIFOAM ABS GEL 100 (HEMOSTASIS) ×3 IMPLANT
SURGIFLO W/THROMBIN 8M KIT (HEMOSTASIS) ×3 IMPLANT
SUT BONE WAX W31G (SUTURE) ×3 IMPLANT
SUT MON AB 3-0 SH 27 (SUTURE) ×2
SUT MON AB 3-0 SH27 (SUTURE) ×1 IMPLANT
SUT SILK 2 0 (SUTURE) ×2
SUT SILK 2-0 18XBRD TIE 12 (SUTURE) ×1 IMPLANT
SUT STRATAFIX SPIRAL + 2-0 (SUTURE) ×3 IMPLANT
SUT VIC AB 2-0 CT1 18 (SUTURE) ×3 IMPLANT
SWIFT SPACER CLIP 2MM ×9 IMPLANT
SYR BULB IRRIGATION 50ML (SYRINGE) ×3 IMPLANT
SYR CONTROL 10ML LL (SYRINGE) ×3 IMPLANT
TAPE CLOTH 4X10 WHT NS (GAUZE/BANDAGES/DRESSINGS) ×3 IMPLANT
TAPE UMBILICAL COTTON 1/8X30 (MISCELLANEOUS) ×3 IMPLANT
TOWEL OR 17X24 6PK STRL BLUE (TOWEL DISPOSABLE) ×3 IMPLANT
TOWEL OR 17X26 10 PK STRL BLUE (TOWEL DISPOSABLE) ×3 IMPLANT
TRAY FOLEY CATH 16FRSI W/METER (SET/KITS/TRAYS/PACK) ×3 IMPLANT
WATER STERILE IRR 1000ML POUR (IV SOLUTION) ×3 IMPLANT
YANKAUER SUCT BULB TIP NO VENT (SUCTIONS) ×3 IMPLANT

## 2015-04-29 NOTE — Brief Op Note (Signed)
04/29/2015  3:19 PM  PATIENT:  Linda Page  65 y.o. female  PRE-OPERATIVE DIAGNOSIS:  CERVICAL SPONDYLITIS RADICULOPATHY  POST-OPERATIVE DIAGNOSIS:  CERVICAL SPONDYLITIS RADICULOPATHY  PROCEDURE:  Procedure(s): ACDF C4-7 (N/A)  SURGEON:  Surgeon(s) and Role:    * Venita Lickahari Olympia Adelsberger, MD - Primary  PHYSICIAN ASSISTANT:   ASSISTANTS: Carmen Mayo   ANESTHESIA:   general  EBL:  Total I/O In: 2500 [I.V.:2500] Out: 590 [Urine:490; Blood:100]  BLOOD ADMINISTERED:none  DRAINS: none   LOCAL MEDICATIONS USED:  MARCAINE     SPECIMEN:  No Specimen  DISPOSITION OF SPECIMEN:  N/A  COUNTS:  YES  TOURNIQUET:  * No tourniquets in log *  DICTATION: .Other Dictation: Dictation Number [id number]  PLAN OF CARE: Admit for overnight observation  PATIENT DISPOSITION:  PACU - hemodynamically stable.

## 2015-04-29 NOTE — Anesthesia Preprocedure Evaluation (Signed)
Anesthesia Evaluation  Patient identified by MRN, date of birth, ID band Patient awake    Reviewed: Allergy & Precautions, NPO status , Patient's Chart, lab work & pertinent test results  Airway Mallampati: II  TM Distance: >3 FB Neck ROM: Limited    Dental no notable dental hx.    Pulmonary neg pulmonary ROS,    Pulmonary exam normal breath sounds clear to auscultation       Cardiovascular hypertension, Pt. on medications Normal cardiovascular exam Rhythm:Regular Rate:Normal     Neuro/Psych Anxiety negative neurological ROS     GI/Hepatic negative GI ROS, Neg liver ROS,   Endo/Other  negative endocrine ROS  Renal/GU negative Renal ROS  negative genitourinary   Musculoskeletal negative musculoskeletal ROS (+)   Abdominal   Peds negative pediatric ROS (+)  Hematology negative hematology ROS (+)   Anesthesia Other Findings   Reproductive/Obstetrics negative OB ROS                             Anesthesia Physical Anesthesia Plan  ASA: II  Anesthesia Plan: General   Post-op Pain Management:    Induction: Intravenous  Airway Management Planned: Oral ETT and Video Laryngoscope Planned  Additional Equipment:   Intra-op Plan:   Post-operative Plan: Extubation in OR  Informed Consent: I have reviewed the patients History and Physical, chart, labs and discussed the procedure including the risks, benefits and alternatives for the proposed anesthesia with the patient or authorized representative who has indicated his/her understanding and acceptance.   Dental advisory given  Plan Discussed with: CRNA and Surgeon  Anesthesia Plan Comments:         Anesthesia Quick Evaluation

## 2015-04-29 NOTE — Op Note (Signed)
NAME:  Linda Page, Linda Page               ACCOUNT NO.:  [id number]  MEDICAL RECORD NO.:  [id number]  LOCATION:  [facility]                         FACILITY:  [facility]  PHYSICIAN:  Melady Chow D. Shon BatonBrooks, M.D. DATE OF BIRTH:  [birth year]  DATE OF PROCEDURE:  04/29/2015 DATE OF DISCHARGE:                              OPERATIVE REPORT   PREOPERATIVE DIAGNOSIS:  Significant degenerative disk disease, cervical spine, multilevel with left radicular arm pain, and dysesthesias.  POSTOPERATIVE DIAGNOSIS:  Significant degenerative disk disease, cervical spine, multilevel with left radicular arm pain, and dysesthesias.  OPERATIVE PROCEDURE:  Anterior cervical diskectomy and fusion, C4-7.  COMPLICATIONS:  None.  INSTRUMENTATION SYSTEM USED:  NanoLOCK Titan intervertebral cage.  At 4- 5 and 5-6, we used a 6 medium lordotic cage.  At 6-7, we used a large size 6 lordotic cage.  All three were packed with DBX mix with the DePuy translational cervical plate, affixed to the cervical spine with 40-mm length screws at C4 and C7 and 12-mm screws at C5 and C6.  FIRST ASSISTANT:  Lilbournarmen Mayo, [place].  COMPLICATIONS:  None.  HISTORY:  This is a very pleasant 65 year old woman who has been having significant neck and neuropathic left arm pain.  Despite appropriate conservative management, she continued to have significant disabling pain.  As a result, we elected to proceed with surgery.  All appropriate risks, benefits, and alternatives were discussed with the patient and consent was obtained.  OPERATIVE NOTE:  The patient was brought to the operating room and placed supine on the operating table.  After successful induction of general anesthesia and endotracheal intubation, TEDs, SCDs, and Foley were inserted and the anterior cervical spine was prepped and draped in a standard fashion.  Time-out was taken to confirm the patient, procedure, and all other pertinent important data.  Once this was completed, a longitudinal  incision was made spanning from C4 down to C7. This was a left-sided incision.  A standard Smith-Robinson approach of the anterior cervical spine was taken, sharply dissected down to the platysma, sacrificed the platysma and then continued dissecting along the medial border of the sternocleidomastoid through the deep cervical fascia.  The omohyoid was identified and transected for improved visualization during the case.  I then retracted the esophagus and trachea over to the right and identified and protected the carotid sheath and then used Kittner dissectors to dissect through the remainder of the prevertebral fascia and exposed the C4-5, C5-6 and C6-7 disk spaces.  Once this was completed, I then placed the needle into the C4-5 disk space and took an intraoperative x-ray and confirmed that I was at the appropriate level.  I then marked the 4-5, 5-6 and 6-7 disks with Bovie and then began mobilizing the longus colli muscle.  Once I had mobilized the longus colli muscle from the midbody of 4 to the midbody of 7, I then used a double-action Leksell rongeur to remove the large anterior exostosis from the 4-5, 5-6 and 6-7 levels.  Distraction pins were then placed into the 6-7 disk space and the Caspar retractor blades were placed.  The endotracheal cuff was deflated.  I expanded the Caspar retracting device and reinflated the endotracheal cuff.  A 15-blade scalpel was used to perform an annulotomy.  Then, using pituitary rongeurs, curettes and Kerrison rongeurs, I removed the disk at 6-7.  I then used the nerve hooks to gently dissect through the annulus and posterior longitudinal ligament until I created space between the PLL and the thecal sac.  Using my 1-mm Kerrison, I resected the remaining portion of annulus and PLL in order to completely decompress the spine. I then undercut the uncovertebral joint to further decompress the nerve. Once this was completed, I rasped the endplates,  measured with trial devices and placed a 6 large Titan titanium nanoLOCK interbody cage, packed with DBX mix.  I had excellent fit.  At this point, I then repositioned my guidepins at C5-6 and performed the exact same diskectomy at this level.  Again, I took down the PLL and undercut the uncovertebral joints to ensure adequate decompression.  At this time, the 6 medium was the better fit and so I placed the 6 medium nanoLOCK interbody Titan cage, packed with DBX mix.  I then repositioned again the 4-5 and again repeated the diskectomy in the same fashion and placed the 6 medium cage as I had before.  At this point, x-rays were satisfactory.  All cages were properly seated and well positioned.  At this point, I then irrigated the wound copiously with normal saline.  I then obtained the appropriate-sized translational plate and secured it with self-drilling screws after removing the distraction pins.  All screws had excellent purchase and were tightened down to finger tight per manufacture's standards and they had expanded the locking D-ring. At this point, with the fusion complete, I then irrigated the wound copiously normal saline and made sure I had hemostasis using bipolar electrocautery and FloSeal.  I returned the trachea and esophagus to midline.  I closed the platysma with a 2-0 STRATAFIX barbed suture and then the skin with 3-0 Monocryl.  Steri-Strips and dry dressing were applied as was the Aspen collar.  The patient was extubated and transferred to the PACU without incident.  At the end of the case, all needle and sponge counts were correct.  Estimated blood loss was 100 mL.     Xaviera Flaten D. Shon Baton, M.D.     DDB/MEDQ  D:  04/29/2015  T:  04/29/2015  Job:  [id number]

## 2015-04-29 NOTE — Anesthesia Postprocedure Evaluation (Signed)
Anesthesia Post Note  Patient: Linda Page  Procedure(s) Performed: Procedure(s) (LRB): ACDF C4-7 (N/A)  Patient location during evaluation: PACU Anesthesia Type: General Level of consciousness: awake and alert Pain management: pain level controlled Vital Signs Assessment: post-procedure vital signs reviewed and stable Respiratory status: spontaneous breathing, nonlabored ventilation, respiratory function stable and patient connected to nasal cannula oxygen Cardiovascular status: blood pressure returned to baseline and stable Postop Assessment: no signs of nausea or vomiting Anesthetic complications: no    Last Vitals:  Filed Vitals:   04/29/15 1553 04/29/15 1559  BP: 146/73 135/71  Pulse: 110 108  Temp:    Resp: 13 14    Last Pain:  Filed Vitals:   04/29/15 1604  PainSc: 0-No pain                 Sebastian Acheheodore Anson Peddie

## 2015-04-29 NOTE — Discharge Instructions (Signed)

## 2015-04-29 NOTE — H&P (Signed)
History of Present Illness Linda Page(Linda Page; 04/20/2015 8:15 AM) The patient is a 65 year old female who comes in today for a preoperative History and Physical. The patient is scheduled for a ACDF C4-7 to be performed by Dr. Debria Garretahari D. Shon BatonBrooks[facility] . Please see the hospital record for complete dictated history and physical.  Additional reasons for visit:  Follow-up Neck is described as the following: The patient is being followed for their left-sided neck pain and cervical DDD. They are 1 year(s) out from when symptoms began. Symptoms reported today include: pain (left shoulder blade, radiates into chest.), aching, stiffness, pain with overhead motions, weakness (both arms,) and burning (left scapula), while the patient does not report symptoms of: pain at night, swelling, throbbing, locking, catching, grinding, giving way, instability, pain with weightbearing, difficulty ambulating, difficulty arising from chair or numbness. The patient feels that they are doing poorly (pain for 1 year) and report their pain level to be 5 / 10. Current treatment includes: activity modification. The following medication has been used for pain control: Ultram. The patient presents today following EMG/NCV (04/05/2015). The patient indicates that they have questions or concerns today regarding pain and their progress at this point.    Subjective Transcription(Carmen C. 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 Allergies Reconciled   Family History (Linda Page; 04/20/2015 8:08 AM) Hypertension  Brother, Father. Kidney disease  Father. Heart Disease  Father. Cancer  Brother, Mother.  Social History Linda Page(Linda Page; 04/20/2015 8:08 AM) Tobacco use  Never smoker. 09/09/2013 Tobacco / smoke exposure  09/09/2013: no Not under pain contract  Children  1 Current work status  working full time No history of drug/alcohol rehab  Never consumed alcohol  09/09/2013: Never consumed alcohol Marital status  widowed Living situation  live alone Exercise  Exercises daily; does running / walking  Medication History (Linda Page; 04/20/2015 8:08 AM) TraMADol HCl (50MG  Tablet, 1 (one) Tablet Oral every six hours, as needed, Taken starting 03/03/2015) Active. ALPRAZolam (0.5MG  Tablet, Oral) Active. Ibuprofen (200MG  Capsule, 1 (one) Oral as needed) Active. Lisinopril-Hydrochlorothiazide (20-12.5MG  Tablet, Oral) Active. Sertraline HCl (100MG  Tablet, Oral) Active. Tylenol (325MG  Tablet, 1 (one) Oral as needed) Active. Medications Reconciled  Vitals Linda Page(Linda Page; 04/20/2015 8:13 AM) 04/20/2015 8:13 AM Weight: 190 lb Height: 64in Body Surface Area: 1.91 m Body Mass Index: 32.61 kg/m  Temp.: 98.24F  Pulse: 95 (Regular)  BP: 123/77 (Sitting, Left Arm, Standard)      Physical Exam (Carmen C. Mayo PA-C; 04/20/2015 8:55 AM) General General Appearance-Not in acute distress. Orientation-Oriented X3. Build & Nutrition-Well nourished and Well developed.  Integumentary General Characteristics Surgical Scars - no surgical scar evidence of previous cervical surgery. Cervical Spine-Skin examination of the cervical spine is without deformity, skin lesions, lacerations or  abrasions.  Chest and Lung Exam Auscultation Breath sounds - Normal and Clear.  Cardiovascular Auscultation Rhythm - Regular rate and rhythm.  Peripheral Vascular  Upper Extremity Palpation - Radial pulse - Bilateral - 2+.  Neurologic Sensation Upper Extremity - Left - sensation is diminished in the upper extremity. Right - sensation is intact in the upper extremity. Reflexes Biceps Reflex - Bilateral - 1+. Brachioradialis Reflex - Bilateral - 1+. Triceps Reflex - Bilateral - 1+. Hoffman's Sign - Bilateral - Hoffman's sign not present.  Musculoskeletal Spine/Ribs/Pelvis  Cervical Spine : Inspection and Palpation - Tenderness - right cervical paraspinals tender to palpation, left cervical paraspinals tender to palpation and left trapezius tender to palpation. Strength and Tone: Strength: Strength: Strength - Hand Grip - Bilateral - 5/5. Right - 5/5. Deltoid - Left - 4-/5. Biceps - Left - 4-/5. Right - 5/5. Triceps - Left - 4-/5. Right - 5/5. Wrist Extension - Left - 4-/5. Right - 5/5. Heel walk - Bilateral - able to heel walk without difficulty. Toe Walk - Bilateral - able to walk on toes without difficulty. Heel-Toe Walk - Bilateral - able to heel-toe walk without difficulty. ROM - Flexion - Moderately Decreased and painful. Extension - Moderately Decreased and painful. Left Lateral Flexion - Moderately Decreased and painful. Right Lateral Flexion - Moderately Decreased and painful. Left Rotation - Moderately Decreased and painful. Right Rotation - Moderately Decreased and painful. Pain - . Cervical Spine - Special Testing - axial compression test negative, cross chest impingement test negative. Non-Anatomic Signs - No non-anatomic signs present. Upper Extremity Range of Motion - No truesholder pain with IR/ER of the shoulders.   RADIOGRAPHS At this point, x-rays today show loss of cervical lordosis in the mid cervical spine. Her MRI from 03/11/2015 shows multilevel degenerative cervical  spondylitic disease, most noted at C4-5, 5-6, 6-7 with severe left foraminal stenosis at 4-5, significant severe right foraminal stenosis at 5-6, moderate foraminal stenosis on the left side and moderate-to-severe left foraminal stenosis at 6-7. No cord signal changes.  Goal Of Surgery: Discussed that goal of surgery is to reduce pain and improve function and quality of life. Patient is aware that despite all appropriate treatment that there pain and function could be the same, worse, or different.  Anterior cervical fusion:Risks of surgery include, but are not limited to: Throat pain, swallowing difficulty, hoarseness or change in voice, death, stroke, paralysis, nerve root damage/injury, bleeding, blood clots, loss of bowel/bladder control, hardware failure, or mal-position, spinal fluid leak, adjacent segment disease, non-union, need for further surgery, ongoing or worse pain, infection. Post-operative bleeding or swelling that could require emergent surgery.  At this point in time, having had several years of debilitating pain, dysesthesias and trace weakness of the C5, 6 and C7 nerve roots, failure to improve with physical therapy and injection therapy, I do believe surgical intervention is appropriate. After discussing this with the patient and her brother, she would like to proceed. All of her and his questions were addressed.

## 2015-04-29 NOTE — Transfer of Care (Signed)
Immediate Anesthesia Transfer of Care Note  Patient: Linda Page  Procedure(s) Performed: Procedure(s): ACDF C4-7 (N/A)  Patient Location: PACU    Anesthesia Type:General  Level of Consciousness: sedated and patient cooperative  Airway & Oxygen Therapy: Patient Spontanous Breathing and Patient connected to nasal cannula oxygen  Post-op Assessment: Report given to RN, Post -op Vital signs reviewed and stable and Patient moving all extremities X 4  Post vital signs: Reviewed and stable  Last Vitals:  Filed Vitals:   04/29/15 0837 04/29/15 1544  BP: 127/70 160/77  Pulse: 82 117  Temp: 36.9 C   Resp: 20     Complications: No apparent anesthesia complications

## 2015-04-29 NOTE — Anesthesia Procedure Notes (Signed)
Procedure Name: Intubation Date/Time: 04/29/2015 10:48 AM Performed by: Carmela RimaMARTINELLI, Azaiah Mello F Pre-anesthesia Checklist: Patient being monitored, Suction available, Emergency Drugs available, Patient identified and Timeout performed Patient Re-evaluated:Patient Re-evaluated prior to inductionOxygen Delivery Method: Circle system utilized Preoxygenation: Pre-oxygenation with 100% oxygen Intubation Type: IV induction Ventilation: Mask ventilation without difficulty Laryngoscope Size: Mac and 3 Grade View: Grade I Tube type: Oral Tube size: 7.0 mm Number of attempts: 1 Placement Confirmation: positive ETCO2,  ETT inserted through vocal cords under direct vision and breath sounds checked- equal and bilateral Secured at: 22 cm Tube secured with: Tape Dental Injury: Teeth and Oropharynx as per pre-operative assessment

## 2015-04-30 DIAGNOSIS — M50121 Cervical disc disorder at C4-C5 level with radiculopathy: Secondary | ICD-10-CM | POA: Diagnosis not present

## 2015-04-30 MED FILL — Thrombin For Soln 20000 Unit: CUTANEOUS | Qty: 1 | Status: AC

## 2015-04-30 NOTE — Progress Notes (Signed)
Pt doing well. Pt and family given D/C instructions with Rx's, verbal understanding was provided. Pt's incision is covered and is clean and dry with no sign of infection. Pt's IV was removed prior to D/C. Pt has Aspen collar on per MD order. Pt received 3-n-1 from [facility] prior to D/C. Pt D/C'd home via wheelchair @ 1020 per MD order. Pt is stable @ D/C and has no other needs at this time. Rema FendtAshley Francia Verry, RN

## 2015-04-30 NOTE — Evaluation (Signed)
Physical Therapy Evaluation Patient Details Name: Linda SheldonDebra C Page MRN: [id number] DOB: Jan 08, 1951 Today's Date: 04/30/2015   History of Present Illness  Pt is a 65 y/o female who presents s/p C4-C7 ACDF on 04/29/15.  Clinical Impression  Pt admitted with above diagnosis. Pt currently with functional limitations due to the deficits listed below (see PT Problem List). At the time of PT eval pt was able to perform transfers and ambulation with min guard to supervision for safety. Pt states she feels comfortable with all mobility she will have to perform at home and is anticipating d/c home today. Will follow until d/c. Pt will benefit from skilled PT to increase their independence and safety with mobility to allow discharge to the venue listed below.       Follow Up Recommendations Outpatient PT;Supervision for mobility/OOB    Equipment Recommendations  3in1 (PT)    Recommendations for Other Services       Precautions / Restrictions Precautions Precautions: Fall;Cervical Precaution Booklet Issued: Yes (comment) Precaution Comments: Reviewed handout, and pt was cued for precautions during functional mobility.  Required Braces or Orthoses: Cervical Brace Cervical Brace: Hard collar;At all times Restrictions Weight Bearing Restrictions: No      Mobility  Bed Mobility               General bed mobility comments: Pt sitting up on EOB when PT arrived.   Transfers Overall transfer level: Needs assistance Equipment used: None Transfers: Sit to/from Stand Sit to Stand: Supervision         General transfer comment: Pt required close supervision for safety as she powered-up to full standing position. No unsteadiness noted however pt appeared very guarded.   Ambulation/Gait Ambulation/Gait assistance: Min guard;Supervision Ambulation Distance (Feet): 300 Feet Assistive device: None Gait Pattern/deviations: Step-through pattern;Decreased stride length Gait velocity:  Decreased Gait velocity interpretation: Below normal speed for age/gender General Gait Details: Initially hands-on guarding was provided for safety and pt progressed to supervision level. Pt did not demonstrate any unsteadiness or LOB.   Stairs Stairs: Yes Stairs assistance: Min guard Stair Management: One rail Left;Step to pattern;Forwards Number of Stairs: 10 General stair comments: VC's for sequencing and general safety.   Wheelchair Mobility    Modified Rankin (Stroke Patients Only)       Balance Overall balance assessment: Needs assistance Sitting-balance support: Feet supported;No upper extremity supported Sitting balance-Leahy Scale: Fair     Standing balance support: No upper extremity supported;During functional activity Standing balance-Leahy Scale: Fair                               Pertinent Vitals/Pain Pain Assessment: Faces Faces Pain Scale: Hurts a little bit Pain Location: Incision site Pain Descriptors / Indicators: Operative site guarding;Sore Pain Intervention(s): Limited activity within patient's tolerance;Monitored during session;Repositioned    Home Living Family/patient expects to be discharged to:: Private residence Living Arrangements: Alone Available Help at Discharge: Family;Available 24 hours/day Type of Home: House Home Access: Stairs to enter Entrance Stairs-Rails: Right;Left;Can reach both Entrance Stairs-Number of Steps: 6 Home Layout: Two level Home Equipment: None      Prior Function Level of Independence: Independent               Hand Dominance        Extremity/Trunk Assessment   Upper Extremity Assessment: Defer to OT evaluation           Lower Extremity Assessment: Overall WFL for  tasks assessed      Cervical / Trunk Assessment: Other exceptions  Communication   Communication: No difficulties  Cognition Arousal/Alertness: Awake/alert Behavior During Therapy: WFL for tasks  assessed/performed Overall Cognitive Status: Within Functional Limits for tasks assessed                      General Comments      Exercises        Assessment/Plan    PT Assessment Patient needs continued PT services  PT Diagnosis Difficulty walking;Acute pain   PT Problem List Decreased strength;Decreased range of motion;Decreased activity tolerance;Decreased balance;Decreased mobility;Decreased knowledge of use of DME;Decreased safety awareness;Decreased knowledge of precautions;Pain  PT Treatment Interventions Gait training;DME instruction;Stair training;Functional mobility training;Therapeutic activities;Therapeutic exercise;Neuromuscular re-education;Patient/family education   PT Goals (Current goals can be found in the Care Plan section) Acute Rehab PT Goals Patient Stated Goal: Home today PT Goal Formulation: With patient Time For Goal Achievement: 05/07/15 Potential to Achieve Goals: Good    Frequency Min 5X/week   Barriers to discharge        Co-evaluation               End of Session Equipment Utilized During Treatment: Cervical collar Activity Tolerance: Patient tolerated treatment well Patient left: in bed;with call bell/phone within reach;with family/visitor present (Sitting EOB) Nurse Communication: Mobility status    Functional Assessment Tool Used: Clinical judgement Functional Limitation: Mobility: Walking and moving around Mobility: Walking and Moving Around Current Status (R6045): At least 1 percent but less than 20 percent impaired, limited or restricted Mobility: Walking and Moving Around Goal Status [phone]): At least 1 percent but less than 20 percent impaired, limited or restricted    Time: 0749-0804 PT Time Calculation (min) (ACUTE ONLY): 15 min   Charges:   PT Evaluation $PT Eval Moderate Complexity: 1 Procedure     PT G Codes:   PT G-Codes **NOT FOR INPATIENT CLASS** Functional Assessment Tool Used: Clinical  judgement Functional Limitation: Mobility: Walking and moving around Mobility: Walking and Moving Around Current Status (X9147): At least 1 percent but less than 20 percent impaired, limited or restricted Mobility: Walking and Moving Around Goal Status [phone]): At least 1 percent but less than 20 percent impaired, limited or restricted    Conni Slipper 04/30/2015, 9:06 AM  Conni Slipper, PT, DPT Acute Rehabilitation Services Pager: [phone]

## 2015-04-30 NOTE — Evaluation (Signed)
Occupational Therapy Evaluation Patient Details Name: Linda Page MRN: [id number] DOB: Jan 11, 1950 Today's Date: 04/30/2015    History of Present Illness Pt is a 65 y/o female who presents s/p C4-C7 ACDF on 04/29/15.   Clinical Impression   Patient evaluated by Occupational Therapy with no further acute OT needs identified. All education has been completed and the patient has no further questions. See below for any follow-up Occupational Therapy or equipment needs. OT to sign off. Thank you for referral.      Follow Up Recommendations  No OT follow up    Equipment Recommendations  3 in 1 bedside comode    Recommendations for Other Services       Precautions / Restrictions Precautions Precautions: Fall;Cervical Precaution Booklet Issued: Yes (comment) Precaution Comments: reviewed handout in detail Required Braces or Orthoses: Cervical Brace Cervical Brace: Hard collar;At all times Restrictions Weight Bearing Restrictions: No      Mobility Bed Mobility               General bed mobility comments: Pt sitting up on EOB when OT arrived.   Transfers Overall transfer level: Needs assistance Equipment used: None Transfers: Sit to/from Stand Sit to Stand: Supervision              Balance Overall balance assessment: Needs assistance   Sitting balance-Leahy Scale: Fair       Standing balance-Leahy Scale: Fair                              ADL Overall ADL's : Needs assistance/impaired Eating/Feeding: Independent   Grooming: Wash/dry hands;Independent   Upper Body Bathing: Supervision/ safety   Lower Body Bathing: Consulting civil engineer Transfer: Supervision/safety           Functional mobility during ADLs: Supervision/safety General ADL Comments: Pt has a dog named daisy that requires a leash to go outside into the yard. Discussed in detail how daughter can setup environment with chair and command hook to allow  patient to sit and leash dog. pt also advised to leave treats by the door in case dog were to become loose for any reason to help call the dog back inside. Pt will have daughter and brother (A) upon d/c. Brother does not work and will check on patient daily     Advice worker      Pertinent Vitals/Pain Faces Pain Scale: Hurts a little bit     Hand Dominance Right   Extremity/Trunk Assessment Upper Extremity Assessment Upper Extremity Assessment: Overall WFL for tasks assessed   Lower Extremity Assessment Lower Extremity Assessment: Defer to PT evaluation   Cervical / Trunk Assessment Cervical / Trunk Assessment: Other exceptions Cervical / Trunk Exceptions: Noted cervical surgical incision   Communication Communication Communication: No difficulties   Cognition Arousal/Alertness: Awake/alert Behavior During Therapy: WFL for tasks assessed/performed Overall Cognitive Status: Within Functional Limits for tasks assessed                     General Comments       Exercises       Shoulder Instructions      Home Living Family/patient expects to be discharged to:: Private residence Living Arrangements: Alone Available Help at Discharge: Family;Available 24 hours/day Type of Home: House Home Access: Stairs to enter [employer] of Steps: 6  Entrance Stairs-Rails: Right;Left;Can reach both Home Layout: Two level Alternate Level Stairs-Number of Steps: Flight Alternate Level Stairs-Rails: Right Bathroom Shower/Tub: Chief Strategy OfficerTub/shower unit   Bathroom Toilet: Standard Bathroom Accessibility: Yes   Home Equipment: None   Additional Comments: has a dog named Daisy      Prior Functioning/Environment Level of Independence: Independent             OT Diagnosis:     OT Problem List:     OT Treatment/Interventions:      OT Goals(Current goals can be found in the care plan section) Acute Rehab OT Goals Patient Stated Goal: Home  today OT Goal Formulation: With patient  OT Frequency:     Barriers to D/C:            Co-evaluation              End of Session Equipment Utilized During Treatment: Cervical collar Nurse Communication: Mobility status;Precautions  Activity Tolerance: Patient tolerated treatment well Patient left: in bed;with call bell/phone within reach   Time: 0830-0907 OT Time Calculation (min): 37 min Charges:  OT General Charges $OT Visit: 1 Procedure OT Evaluation $OT Eval Moderate Complexity: 1 Procedure OT Treatments $Self Care/Home Management : 8-22 mins G-Codes:    Boone MasterJones, Chevi Lim B 04/30/2015, 2:55 PM    Mateo FlowJones, Brynn   OTR/L Pager: [phone]: [phone] Office: [phone] .

## 2015-04-30 NOTE — Progress Notes (Signed)
    Subjective: Procedure(s) (LRB): ACDF C4-7 (N/A) 1 Day Post-Op  Patient reports pain as 2 on 0-10 scale.  Reports decreased arm pain denies incisional neck pain   Positive void Negative bowel movement Positive flatus Negative chest pain or shortness of breath  Objective: Vital signs in last 24 hours: Temp:  [97.4 F (36.3 C)-98.8 F (37.1 C)] 98.8 F (37.1 C) (04/21 0400) Pulse Rate:  [82-117] 88 (04/21 0400) Resp:  [8-20] 20 (04/21 0400) BP: (104-160)/(52-77) 113/61 mmHg (04/21 0400) SpO2:  [92 %-99 %] 95 % (04/21 0400) Weight:  [92.931 kg (204 lb 14 oz)] 92.931 kg (204 lb 14 oz) (04/20 0837)  Intake/Output from previous day: 04/20 0701 - 04/21 0700 In: 2500 [I.V.:2500] Out: 2365 [Urine:2265; Blood:100]  Labs: No results for input(s): WBC, RBC, HCT, PLT in the last 72 hours. No results for input(s): NA, K, CL, CO2, BUN, CREATININE, GLUCOSE, CALCIUM in the last 72 hours. No results for input(s): LABPT, INR in the last 72 hours.  Physical Exam: Neurologically intact ABD soft Intact pulses distally Incision: dressing C/D/I and no drainage Compartment soft  Assessment/Plan: Patient stable  xrays satisfactory Mobilization with physical therapy Encourage incentive spirometry Continue care  Advance diet Up with therapy  Plan on d/c to home today  Venita Lickahari Criselda Starke, MD [facility] [phone]

## 2015-05-03 ENCOUNTER — Encounter (HOSPITAL_COMMUNITY): Payer: Self-pay | Admitting: Orthopedic Surgery

## 2015-05-17 NOTE — Discharge Summary (Signed)
Patient ID: Linda SheldonDebra C Joa MRN: [id number] DOB/AGE: [birth year] 65 y.o.  Admit date: 04/29/2015 Discharge date: 05/17/2015  Admission Diagnoses:  Active Problems:   Neck pain   Discharge Diagnoses:  Active Problems:   Neck pain  status post Procedure(s): ACDF C4-7  Past Medical History  Diagnosis Date  . Hypertension   . Depression   . Arthritis     in neck    Surgeries: Procedure(s): ACDF C4-7 on 04/29/2015   Consultants:    Discharged Condition: Improved  Hospital Course: Linda Page is an 65 y.o. female who was admitted 04/29/2015 for operative treatment of ACDF C4-7. Patient failed conservative treatments (please see the history and physical for the specifics) and had severe unremitting pain that affects sleep, daily activities and work/hobbies. After pre-op clearance, the patient was taken to the operating room on 04/29/2015 and underwent  Procedure(s): ACDF C4-7.  The pt was discharged on 04/30/15.   Patient was given perioperative antibiotics:  Anti-infectives    Start     Dose/Rate Route Frequency Ordered Stop   04/29/15 2200  ceFAZolin (ANCEF) IVPB 1 g/50 mL premix     1 g 100 mL/hr over 30 Minutes Intravenous Every 8 hours 04/29/15 1740 04/30/15 0522   04/29/15 0726  ceFAZolin (ANCEF) IVPB 2g/100 mL premix     2 g 200 mL/hr over 30 Minutes Intravenous 30 min pre-op 04/29/15 0726 04/29/15 1503       Patient was given sequential compression devices and early ambulation to prevent DVT.   Patient benefited maximally from hospital stay and there were no complications. At the time of discharge, the patient was urinating/moving their bowels without difficulty, tolerating a regular diet, pain is controlled with oral pain medications and they have been cleared by PT/OT.   Recent vital signs: No data found.    Recent laboratory studies: No results for input(s): WBC, HGB, HCT, PLT, NA, K, CL, CO2, BUN, CREATININE, GLUCOSE, INR, CALCIUM in the last 72  hours.  Invalid input(s): PT, 2   Discharge Medications:     Medication List    STOP taking these medications        acetaminophen 500 MG tablet  Commonly known as:  TYLENOL     LISINOPRIL PO     sertraline 100 MG tablet  Commonly known as:  ZOLOFT     SERTRALINE HCL PO      TAKE these medications        ALPRAZolam 0.5 MG tablet  Commonly known as:  XANAX  Take 0.5 mg by mouth 3 (three) times daily as needed for anxiety.     lisinopril-hydrochlorothiazide 20-12.5 MG tablet  Commonly known as:  PRINZIDE,ZESTORETIC  Take 1 tablet by mouth daily.     methocarbamol 500 MG tablet  Commonly known as:  ROBAXIN  Take 1 tablet (500 mg total) by mouth 3 (three) times daily as needed for muscle spasms.     ondansetron 4 MG tablet  Commonly known as:  ZOFRAN  Take 1 tablet (4 mg total) by mouth every 8 (eight) hours as needed for nausea or vomiting.     oxyCODONE-acetaminophen 10-325 MG tablet  Commonly known as:  PERCOCET  Take 1 tablet by mouth every 4 (four) hours as needed for pain.        Diagnostic Studies: Dg Cervical Spine 2 Or 3 Views  04/29/2015  CLINICAL DATA:  Status post anterior cervical disc fusion. EXAM: CERVICAL SPINE - 2-3 VIEW COMPARISON:  Earlier today  FINDINGS: Anterior sideplate and screw device extends from C4 through C7. Interbody spacer is identified at C4-5, C5-6 and C6-7. The alignment is anatomic. No complicating features identified. Degenerative disc disease is noted at C3-4. IMPRESSION: Status post anterior cervical disc fusion of the C4 through C7. Electronically Signed   By: Signa Kell M.D.   On: 04/29/2015 16:49   Dg Cervical Spine 2-3 Views  04/29/2015  CLINICAL DATA:  Elective surgery EXAM: DG C-ARM 61-120 MIN; CERVICAL SPINE - 2-3 VIEW COMPARISON:  None. FINDINGS: Status post C4-5, C5-6, and C6-7 discectomy with ventral plate and screw fixation. No indication of graft displacement or other hardware compromise. No visible fracture.  IMPRESSION: C4-5, C5-6, and C6-7 ACDF.  No adverse finding. Electronically Signed   By: Marnee Spring M.D.   On: 04/29/2015 15:47   Dg C-arm 61-120 Min  04/29/2015  CLINICAL DATA:  Elective surgery EXAM: DG C-ARM 61-120 MIN; CERVICAL SPINE - 2-3 VIEW COMPARISON:  None. FINDINGS: Status post C4-5, C5-6, and C6-7 discectomy with ventral plate and screw fixation. No indication of graft displacement or other hardware compromise. No visible fracture. IMPRESSION: C4-5, C5-6, and C6-7 ACDF.  No adverse finding. Electronically Signed   By: Marnee Spring M.D.   On: 04/29/2015 15:47          Follow-up Information    Follow up with Alvy Beal, MD. Schedule an appointment as soon as possible for a visit in 2 weeks.   Specialty:  Orthopedic Surgery   Why:  For suture removal, For wound re-check, If symptoms worsen   Contact information:   [address] [place] [address] [phone]       Discharge Plan:  discharge to home Disposition:     Signed: Kirt Boys for Dr. Venita Lick [facility] [phone] 05/17/2015, 3:05 PM

## 2015-07-06 ENCOUNTER — Ambulatory Visit (HOSPITAL_COMMUNITY): Payer: BC Managed Care – PPO | Attending: Orthopedic Surgery | Admitting: Physical Therapy

## 2015-07-06 DIAGNOSIS — R252 Cramp and spasm: Secondary | ICD-10-CM | POA: Diagnosis present

## 2015-07-06 DIAGNOSIS — R293 Abnormal posture: Secondary | ICD-10-CM | POA: Insufficient documentation

## 2015-07-06 NOTE — Patient Instructions (Signed)
   SCAPULAR RETRACTIONS  Draw your shoulder blades back and down. It should feel like you are squeezing your shoulder blades together.  Repeat 15 times, 2-3 times per day.     SHOULDER ROLLS  Move your shoulders in a circular pattern as shown so that your are moving in an up, back and down direction. Perform small cicles if needed for comfort.  Repeat 15 times, 2-3 times per day.      RETRACTION / CHIN TUCK  Slowly draw your head back so that your ears line up with your shoulders. Think of making a "double chin".   Repeat 10-15 times, twice a day.    PROPER CERVICAL AND SPINAL POSTURE - SEATED  Good posture positions your head over your shoulders so that your head is not protruding forward. Your ears should be over your shoulders.   Begin by correcting your low back so that it is not slouched. This will correct much of the spine. You may also need to perform a small chin tuck as well.    The image on the right shows how you should position your head and spine throughout the day. This might be difficult at first but over time will get easier as your body adjusts.

## 2015-07-06 NOTE — Therapy (Signed)
[facility] [address] [place], [place], [address] Phone: [phone]   Fax:  [phone]  Physical Therapy Evaluation  Patient Details  Name: Linda SheldonDebra C Page MRN: [id number] Date of Birth: [birth year] Referring Provider: Venita Lickahari Page   Encounter Date: 07/06/2015      PT End of Session - 07/06/15 1706    Visit Number 1   Number of Visits 1   Authorization Type [employer] PPO    Authorization Time Period 07/06/15 to 07/06/15   PT Start Time 1348   PT Stop Time 1418  not in need of skilled PT services    PT Time Calculation (min) 30 min   Activity Tolerance Patient tolerated treatment well   Behavior [facility] Therapy [facility] for tasks assessed/performed      Past Medical History  Diagnosis Date  . Hypertension   . Depression   . Arthritis     in neck    Past Surgical History  Procedure Laterality Date  . Hand surgery      rt  . Knee surgery      rt  . Cataract surgery      bilateral  . Anterior cervical decomp/discectomy fusion N/A 04/29/2015    Procedure: ACDF C4-7;  Surgeon: Linda Lickahari Brooks, MD;  Location: [facility] OR;  Service: Orthopedics;  Laterality: N/A;    There were no vitals filed for this visit.       Subjective Assessment - 07/06/15 1352    Subjective Patient reports that she has had neck pain and symptoms for 2 years; it then started affected the strength in her UE and she had quite a bit of numbness in her neck. She ended up having surgery on 04/29/15 with Dr. Shon Page. She has been feeling better since having the surgery; she has been having some muscle soreness since surgery but has not had high pain levels.    Pertinent History Anterior cervical diskectomy and fusion, C4-7 performed on 04/29/15   How long can you sit comfortably? her neck gets sore but unlimited    How long can you stand comfortably? unlimited    How long can you walk comfortably? unlimited    Patient Stated Goals patient not sure that she  needs PT but is open to trying it    Currently in Pain? No/denies  just muscle soreness             [facility] PT Assessment - 07/06/15 0001    Assessment   Medical Diagnosis anterior cervical surgery    Referring Provider Linda Page    Onset Date/Surgical Date 04/29/15   Next MD Visit Dr. Shon Page on 07/23/15   Precautions   Precaution Comments cannot lift more than  4#   Balance Screen   Has the patient fallen in the past 6 months No   Has the patient had a decrease in activity level because of a fear of falling?  No   Is the patient reluctant to leave their home because of a fear of falling?  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 [place]    Leisure crafting, yardwork    Observation/Other Assessments   Focus on Therapeutic Outcomes (FOTO)  48% limited    Posture/Postural Control   Posture/Postural Control Postural limitations   Postural Limitations Rounded Shoulders;Forward head;Increased thoracic kyphosis   AROM  Right Shoulder Flexion --  wfl    Right Shoulder ABduction --  wfl    Right Shoulder Internal Rotation --  approx T10   Right Shoulder External Rotation --  approx T2    Left Shoulder Flexion --  wfl    Left Shoulder ABduction --  mild limitation but aptient reprots this is her norm    Left Shoulder Internal Rotation --  approx T10   Left Shoulder External Rotation --  approx T4    Cervical Flexion 35   Cervical Extension 18   Cervical - Right Side Bend 38   Cervical - Left Side Bend 40   Cervical - Right Rotation 45   Cervical - Left Rotation 55   Strength   Right Shoulder Flexion 5/5   Right Shoulder ABduction 5/5   Right Shoulder Internal Rotation 5/5   Right Shoulder External Rotation 4/5   Left Shoulder Flexion 5/5   Left Shoulder ABduction 5/5   Left Shoulder Internal  Rotation 5/5   Left Shoulder External Rotation 5/5   Cervical Flexion 4-/5   Cervical Extension 4+/5   Cervical - Right Side Bend 4-/5   Cervical - Left Side Bend 4/5   Palpation   Palpation comment tension noted bilateral upper traps, bilateral cervical extensors                            PT Education - 07/06/15 1705    Education provided Yes   Education Details prognosis, POC, HEP, ifnormation for local massage therapist    Person(s) Educated Patient   Methods Explanation;Demonstration;Handout   Comprehension Verbalized understanding;Returned demonstration          PT Short Term Goals - 07/06/15 1428    PT SHORT TERM GOAL #1   Title Patient to be independent in correct performance of appropriate HEP to adddress postural limitations    Time 1   Period Days   Status Achieved           PT Long Term Goals - 07/06/15 1428    PT LONG TERM GOAL #1   Title N/A, patient not in need of skilled PT services at this time                Plan - 07/06/15 1426    Clinical Impression Statement Patient arrives status post anterior cervical surgery, for which she received discectomies and fusion of C4-C7; she reports she has been doing excellent after her surgery, has been abiding by her MD's precautions, and really is able to do all she needs and wants to do at this point. She is questioning if she is even in need of skilled PT services, and does state concern over a 72 dollar co-pay. Upon examination, patient's main impairments appear to be muscle tenderness and stiffness, postural limitations, mild functional weakness, and limited cervical ROM- however reduced cervical ROM is anticipated due to the nature of her surgery. Patient does not appear to be in need of skilled PT services at this point in time, however she might benefit from working with a licensed massage therapist to address muscle soreness as this is her primary complaint. Assigned patient a general  postural HEP and provided her with contact information of local massage therapist. Patient not in need of skilled PT services at this time.    Rehab Potential Excellent   PT Frequency One time visit   PT Treatment/Interventions ADLs/Self Care Home Management;Therapeutic exercise  PT Next Visit Plan N/A- one time visit    PT Home Exercise Plan given, focus on postural exercises and activities    Recommended Other Services massage therapist for muscle soreness and tightness    Consulted and Agree with Plan of Care Patient      Patient will benefit from skilled therapeutic intervention in order to improve the following deficits and impairments:  Hypomobility, Increased fascial restricitons, Decreased strength, Increased muscle spasms, Postural dysfunction  Visit Diagnosis: Abnormal posture - Plan: PT plan of care cert/re-cert  Cramp and spasm - Plan: PT plan of care cert/re-cert     Problem List Patient Active Problem List   Diagnosis Date Noted  . Neck pain 04/29/2015  . Degeneration of cervical intervertebral disc 09/24/2013  . Pain in joint, shoulder region 09/24/2013  . Muscle weakness (generalized) 09/24/2013  . Decreased range of motion of neck 09/24/2013    Nedra Hai PT, DPT [phone]  [facility] [address] [place], [place], [address] Phone: [phone]   Fax:  [phone]  Name: TAMANIKA HEINEY MRN: [id number] Date of Birth: 20-Aug-1950

## 2018-06-06 ENCOUNTER — Encounter: Payer: Self-pay | Admitting: Nurse Practitioner

## 2018-08-12 ENCOUNTER — Encounter: Payer: Self-pay | Admitting: *Deleted

## 2018-08-12 ENCOUNTER — Encounter: Payer: Self-pay | Admitting: Nurse Practitioner

## 2018-08-12 ENCOUNTER — Other Ambulatory Visit: Payer: Self-pay

## 2018-08-12 ENCOUNTER — Ambulatory Visit (INDEPENDENT_AMBULATORY_CARE_PROVIDER_SITE_OTHER): Payer: BC Managed Care – PPO | Admitting: Nurse Practitioner

## 2018-08-12 ENCOUNTER — Other Ambulatory Visit: Payer: Self-pay | Admitting: *Deleted

## 2018-08-12 DIAGNOSIS — Z0000 Encounter for general adult medical examination without abnormal findings: Secondary | ICD-10-CM | POA: Insufficient documentation

## 2018-08-12 DIAGNOSIS — Z1211 Encounter for screening for malignant neoplasm of colon: Secondary | ICD-10-CM

## 2018-08-12 MED ORDER — PEG 3350-KCL-NA BICARB-NACL 420 G PO SOLR: 4000.0000 mL | Freq: Once | ORAL | 0 refills | Status: AC

## 2018-08-12 NOTE — Patient Instructions (Signed)
Your health issues we discussed today were:   Need for colonoscopy: 1. We will schedule your colonoscopy for you 2. Further recommendations will be made after your colonoscopy  Overall I recommend:  1. Continue your current medications 2. Return for follow-up based on recommendations made after your colonoscopy 3. Call us if you have any questions or concerns.   Because of recent events of COVID-19 ("Coronavirus"), follow CDC recommendations:  1. Wash your hand frequently 2. Avoid touching your face 3. Stay away from people who are sick 4. If you have symptoms such as fever, cough, shortness of breath then call your healthcare provider for further guidance 5. If you are sick, STAY AT HOME unless otherwise directed by your healthcare provider. 6. Follow directions from state and national officials regarding staying safe   At [facility] we value your feedback. You may receive a survey about your visit today. Please share your experience as we strive to create trusting relationships with our patients to provide genuine, compassionate, quality care.  We appreciate your understanding and patience as we review any laboratory studies, imaging, and other diagnostic tests that are ordered as we care for you. Our office policy is 5 business days for review of these results, and any emergent or urgent results are addressed in a timely manner for your best interest. If you do not hear from our office in 1 week, please contact us.   We also encourage the use of MyChart, which contains your medical information for your review as well. If you are not enrolled in this feature, an access code is on this after visit summary for your convenience. Thank you for allowing [place] to be involved in your care.  It was great to see you today!  I hope you have a great summer!!

## 2018-08-12 NOTE — Assessment & Plan Note (Signed)
The patient is 68 years old and is never had a colonoscopy.  Her brother did have colon polyps.  She is overdue for colonoscopy.  Generally asymptomatic from a GI standpoint.  At this time we will proceed with a colonoscopy.  Proceed with colonoscopy on propofol/MAC with Dr. Oneida Alar in the near future. The risks, benefits, and alternatives have been discussed in detail with the patient. They state understanding and desire to proceed.   The patient is currently on Xanax, Zoloft.  No other anticoagulants, anxiolytics, chronic pain medications, antidepressants, antidiabetics, or iron supplementations.  We will plan for the procedure on propofol/MAC to promote adequate sedation.

## 2018-08-12 NOTE — Progress Notes (Signed)
Pre-op appt mailed to patient 

## 2018-08-12 NOTE — Progress Notes (Addendum)
REVIEWED-NO ADDITIONAL RECOMMENDATIONS.  Primary Care Physician:  Sinda Du, MD Primary Gastroenterologist:  Dr. Oneida Alar  Chief Complaint  Patient presents with  . Consult    TCS. brother has h/o polyps    HPI:   Linda Page is a 68 y.o. female who presents on referral from primary care for colonoscopy.  Nurse/phone triage was deferred to office visit.  Reviewed information provided with the referral including office visit dated 06/03/2018.  No mention of overt GI symptoms at that time.  Reviewed labs which showed normal CBC, normal LFTs.  No history of colonoscopy found in our system.  Today she states she's doing ok overall. Has never has a colonoscopy before. Denies abdominal pain, N/V, hematochezia, melena, fever, chills, unintentional weight loss. Denies URI or flu-like symptoms. Denies loss of sense of taste or smell. Denies chest pain, dyspnea, dizziness, lightheadedness, syncope, near syncope. Denies any other upper or lower GI symptoms.  Her brother has a history of polyps.  Past Medical History:  Diagnosis Date  . Arthritis    in neck  . Depression   . Hypertension     Past Surgical History:  Procedure Laterality Date  . ANTERIOR CERVICAL DECOMP/DISCECTOMY FUSION N/A 04/29/2015   Procedure: ACDF C4-7;  Surgeon: Melina Schools, MD;  Location: [facility];  Service: Orthopedics;  Laterality: N/A;  . cataract surgery     bilateral  . HAND SURGERY     rt  . KNEE SURGERY     rt    Current Outpatient Medications  Medication Sig Dispense Refill  . ALPRAZolam (XANAX) 1 MG tablet Take 1 mg by mouth 2 (two) times daily as needed for anxiety.     Marland Kitchen atorvastatin (LIPITOR) 20 MG tablet Take 20 mg by mouth daily.    . Cholecalciferol (VITAMIN D) 50 MCG (2000 UT) CAPS Take by mouth daily.    . Glucosamine-Chondroit-Vit C-Mn (GLUCOSAMINE 1500 COMPLEX PO) Take by mouth daily.    Marland Kitchen lisinopril-hydrochlorothiazide (PRINZIDE,ZESTORETIC) 20-12.5 MG tablet Take 1 tablet by mouth  2 (two) times a day.     . sertraline (ZOLOFT) 100 MG tablet Take 150 mg by mouth daily.      No current facility-administered medications for this visit.     Allergies as of 08/12/2018 - Review Complete 08/12/2018  Allergen Reaction Noted  . Codeine Nausea And Vomiting 04/29/2015  . Other Itching 04/15/2015  . Tramadol Nausea And Vomiting and Other (See Comments) 04/29/2015    Family History  Problem Relation Age of Onset  . Colon polyps Brother   . Colon cancer Neg Hx     Social History   Socioeconomic History  . Marital status: Widowed    Spouse name: Not on file  . Number of children: Not on file  . Years of education: Not on file  . Highest education level: Not on file  Occupational History  . Not on file  Social Needs  . Financial resource strain: Not on file  . Food insecurity    Worry: Not on file    Inability: Not on file  . Transportation needs    Medical: Not on file    Non-medical: Not on file  Tobacco Use  . Smoking status: Never Smoker  . Smokeless tobacco: Never Used  Substance and Sexual Activity  . Alcohol use: No  . Drug use: No  . Sexual activity: Not on file  Lifestyle  . Physical activity    Days per week: Not on file  Minutes per session: Not on file  . Stress: Not on file  Relationships  . Social Musicianconnections    Talks on phone: Not on file    Gets together: Not on file    Attends religious service: Not on file    Active member of club or organization: Not on file    Attends meetings of clubs or organizations: Not on file    Relationship status: Not on file  . Intimate partner violence    Fear of current or ex partner: Not on file    Emotionally abused: Not on file    Physically abused: Not on file    Forced sexual activity: Not on file  Other Topics Concern  . Not on file  Social History Narrative  . Not on file    Review of Systems: Complete ROS negative except as per HPI.    Physical Exam: BP 112/69   Pulse (!) 102    Temp (!) 97 F (36.1 C) (Oral)   Ht 5\' 4"  (1.626 m)   Wt 207 lb 6.4 oz (94.1 kg)   BMI 35.60 kg/m  General:   Alert and oriented. Pleasant and cooperative. Well-nourished and well-developed.  Head:  Normocephalic and atraumatic. Eyes:  Without icterus, sclera clear and conjunctiva pink.  Ears:  Normal auditory acuity. Cardiovascular:  S1, S2 present without murmurs appreciated. Extremities without clubbing or edema. Respiratory:  Clear to auscultation bilaterally. No wheezes, rales, or rhonchi. No distress.  Gastrointestinal:  +BS, soft, non-tender and non-distended. No HSM noted. No guarding or rebound. No masses appreciated.  Rectal:  Deferred  Musculoskalatal:  Symmetrical without gross deformities. Neurologic:  Alert and oriented x4;  grossly normal neurologically. Psych:  Alert and cooperative. Normal mood and affect. Heme/Lymph/Immune: No excessive bruising noted.    08/12/2018 8:24 AM   Disclaimer: This note was dictated with voice recognition software. Similar sounding words can inadvertently be transcribed and may not be corrected upon review.

## 2018-08-13 NOTE — Progress Notes (Signed)
cc'ed to pcp °

## 2018-09-03 ENCOUNTER — Other Ambulatory Visit: Payer: Self-pay | Admitting: Pulmonary Disease

## 2018-09-03 ENCOUNTER — Other Ambulatory Visit (HOSPITAL_COMMUNITY): Payer: Self-pay | Admitting: Pulmonary Disease

## 2018-09-03 DIAGNOSIS — K760 Fatty (change of) liver, not elsewhere classified: Secondary | ICD-10-CM

## 2018-09-11 ENCOUNTER — Ambulatory Visit (HOSPITAL_COMMUNITY)
Admission: RE | Admit: 2018-09-11 | Discharge: 2018-09-11 | Disposition: A | Discharge status: Home / Self Care | Payer: BC Managed Care – PPO | Source: Ambulatory Visit | Attending: Pulmonary Disease | Admitting: Pulmonary Disease

## 2018-09-11 ENCOUNTER — Other Ambulatory Visit: Payer: Self-pay

## 2018-09-11 DIAGNOSIS — K760 Fatty (change of) liver, not elsewhere classified: Secondary | ICD-10-CM | POA: Insufficient documentation

## 2018-09-23 ENCOUNTER — Other Ambulatory Visit (HOSPITAL_COMMUNITY): Payer: Self-pay | Admitting: Pulmonary Disease

## 2018-09-23 ENCOUNTER — Other Ambulatory Visit: Payer: Self-pay | Admitting: Pulmonary Disease

## 2018-09-23 DIAGNOSIS — K760 Fatty (change of) liver, not elsewhere classified: Secondary | ICD-10-CM

## 2018-09-23 DIAGNOSIS — K862 Cyst of pancreas: Secondary | ICD-10-CM

## 2018-10-04 ENCOUNTER — Ambulatory Visit (HOSPITAL_COMMUNITY)
Admission: RE | Admit: 2018-10-04 | Discharge: 2018-10-04 | Disposition: A | Discharge status: Home / Self Care | Payer: BC Managed Care – PPO | Source: Ambulatory Visit | Attending: Pulmonary Disease | Admitting: Pulmonary Disease

## 2018-10-04 ENCOUNTER — Encounter (HOSPITAL_COMMUNITY): Payer: Self-pay

## 2018-10-04 ENCOUNTER — Other Ambulatory Visit: Payer: Self-pay

## 2018-10-04 DIAGNOSIS — K862 Cyst of pancreas: Secondary | ICD-10-CM | POA: Diagnosis not present

## 2018-10-04 DIAGNOSIS — K760 Fatty (change of) liver, not elsewhere classified: Secondary | ICD-10-CM

## 2018-10-04 LAB — POCT I-STAT CREATININE: Creatinine, Ser: 1 mg/dL (ref 0.44–1.00)

## 2018-10-04 MED ORDER — IOHEXOL 300 MG/ML  SOLN
100.0000 mL | Freq: Once | INTRAMUSCULAR | Status: AC | PRN
  Administered 2018-10-04: 100 mL via INTRAVENOUS

## 2018-10-16 ENCOUNTER — Other Ambulatory Visit: Payer: Self-pay | Admitting: Orthopedic Surgery

## 2018-10-16 DIAGNOSIS — M84375A Stress fracture, left foot, initial encounter for fracture: Secondary | ICD-10-CM

## 2018-10-22 ENCOUNTER — Ambulatory Visit
Admission: RE | Admit: 2018-10-22 | Discharge: 2018-10-22 | Disposition: A | Discharge status: Home / Self Care | Payer: BC Managed Care – PPO | Source: Ambulatory Visit | Attending: Orthopedic Surgery | Admitting: Orthopedic Surgery

## 2018-10-22 DIAGNOSIS — M84375A Stress fracture, left foot, initial encounter for fracture: Secondary | ICD-10-CM

## 2018-10-29 NOTE — Patient Instructions (Signed)
Linda Page  10/29/2018     @PREFPERIOPPHARMACY @   Your procedure is scheduled on  11/05/2018 .  Report to [facility] at  0900  A.M.  Call this number if you have problems the morning of surgery:  [phone]   Remember:  Follow the diet and prep instructions given to you by Dr Nona Dell office.                     Take these medicines the morning of surgery with A SIP OF WATER  Xanax(if needed), zoloft.    Do not wear jewelry, make-up or nail polish.  Do not wear lotions, powders, or perfume. Please wear deodorant and brush your teeth.  Do not shave 48 hours prior to surgery.  Men may shave face and neck.  Do not bring valuables to the hospital.  [facility] is not responsible for any belongings or valuables.  Contacts, dentures or bridgework may not be worn into surgery.  Leave your suitcase in the car.  After surgery it may be brought to your room.  For patients admitted to the hospital, discharge time will be determined by your treatment team.  Patients discharged the day of surgery will not be allowed to drive home.   Name and phone number of your driver:   family Special instructions:  None  Please read over the following fact sheets that you were given. Anesthesia Post-op Instructions and Care and Recovery After Surgery       Colonoscopy, Adult, Care After This sheet gives you information about how to care for yourself after your procedure. Your health care provider may also give you more specific instructions. If you have problems or questions, contact your health care provider. What can I expect after the procedure? After the procedure, it is common to have:  A small amount of blood in your stool for 24 hours after the procedure.  Some gas.  Mild abdominal cramping or bloating. Follow these instructions at home: General instructions  For the first 24 hours after the procedure: ? Do not drive or use machinery. ? Do not sign important  documents. ? Do not drink alcohol. ? Do your regular daily activities at a slower pace than normal. ? Eat soft, easy-to-digest foods.  Take over-the-counter or prescription medicines only as told by your health care provider. Relieving cramping and bloating   Try walking around when you have cramps or feel bloated.  Apply heat to your abdomen as told by your health care provider. Use a heat source that your health care provider recommends, such as a moist heat pack or a heating pad. ? Place a towel between your skin and the heat source. ? Leave the heat on for 20-30 minutes. ? Remove the heat if your skin turns bright red. This is especially important if you are unable to feel pain, heat, or cold. You may have a greater risk of getting burned. Eating and drinking   Drink enough fluid to keep your urine pale yellow.  Resume your normal diet as instructed by your health care provider. Avoid heavy or fried foods that are hard to digest.  Avoid drinking alcohol for as long as instructed by your health care provider. Contact a health care provider if:  You have blood in your stool 2-3 days after the procedure. Get help right away if:  You have more than a small spotting of blood in your stool.  You pass  large blood clots in your stool.  Your abdomen is swollen.  You have nausea or vomiting.  You have a fever.  You have increasing abdominal pain that is not relieved with medicine. Summary  After the procedure, it is common to have a small amount of blood in your stool. You may also have mild abdominal cramping and bloating.  For the first 24 hours after the procedure, do not drive or use machinery, sign important documents, or drink alcohol.  Contact your health care provider if you have a lot of blood in your stool, nausea or vomiting, a fever, or increased abdominal pain. This information is not intended to replace advice given to you by your health care provider. Make sure  you discuss any questions you have with your health care provider. Document Released: 08/10/2003 Document Revised: 10/18/2016 Document Reviewed: 03/09/2015 Elsevier Patient Education  2020 [place] After These instructions provide you with information about caring for yourself after your procedure. Your health care provider may also give you more specific instructions. Your treatment has been planned according to current medical practices, but problems sometimes occur. Call your health care provider if you have any problems or questions after your procedure. What can I expect after the procedure? After your procedure, you may:  Feel sleepy for several hours.  Feel clumsy and have poor balance for several hours.  Feel forgetful about what happened after the procedure.  Have poor judgment for several hours.  Feel nauseous or vomit.  Have a sore throat if you had a breathing tube during the procedure. Follow these instructions at home: For at least 24 hours after the procedure:      Have a responsible adult stay with you. It is important to have someone help care for you until you are awake and alert.  Rest as needed.  Do not: ? Participate in activities in which you could fall or become injured. ? Drive. ? Use heavy machinery. ? Drink alcohol. ? Take sleeping pills or medicines that cause drowsiness. ? Make important decisions or sign legal documents. ? Take care of children on your own. Eating and drinking  Follow the diet that is recommended by your health care provider.  If you vomit, drink water, juice, or soup when you can drink without vomiting.  Make sure you have little or no nausea before eating solid foods. General instructions  Take over-the-counter and prescription medicines only as told by your health care provider.  If you have sleep apnea, surgery and certain medicines can increase your risk for breathing problems.  Follow instructions from your health care provider about wearing your sleep device: ? Anytime you are sleeping, including during daytime naps. ? While taking prescription pain medicines, sleeping medicines, or medicines that make you drowsy.  If you smoke, do not smoke without supervision.  Keep all follow-up visits as told by your health care provider. This is important. Contact a health care provider if:  You keep feeling nauseous or you keep vomiting.  You feel light-headed.  You develop a rash.  You have a fever. Get help right away if:  You have trouble breathing. Summary  For several hours after your procedure, you may feel sleepy and have poor judgment.  Have a responsible adult stay with you for at least 24 hours or until you are awake and alert. This information is not intended to replace advice given to you by your health care provider. Make sure you discuss any  questions you have with your health care provider. Document Released: 04/18/2015 Document Revised: 03/26/2017 Document Reviewed: 04/18/2015 Elsevier Patient Education  2020 [employer].

## 2018-11-01 ENCOUNTER — Other Ambulatory Visit: Payer: Self-pay

## 2018-11-01 ENCOUNTER — Other Ambulatory Visit (HOSPITAL_COMMUNITY)
Admission: RE | Admit: 2018-11-01 | Discharge: 2018-11-01 | Disposition: A | Discharge status: Home / Self Care | Payer: BC Managed Care – PPO | Source: Ambulatory Visit | Attending: Gastroenterology | Admitting: Gastroenterology

## 2018-11-01 ENCOUNTER — Encounter (HOSPITAL_COMMUNITY)
Admission: RE | Admit: 2018-11-01 | Discharge: 2018-11-01 | Disposition: A | Discharge status: Home / Self Care | Payer: BC Managed Care – PPO | Source: Ambulatory Visit | Attending: Gastroenterology | Admitting: Gastroenterology

## 2018-11-01 ENCOUNTER — Encounter (HOSPITAL_COMMUNITY): Payer: Self-pay

## 2018-11-01 DIAGNOSIS — K649 Unspecified hemorrhoids: Secondary | ICD-10-CM | POA: Diagnosis not present

## 2018-11-01 DIAGNOSIS — K5790 Diverticulosis of intestine, part unspecified, without perforation or abscess without bleeding: Secondary | ICD-10-CM | POA: Diagnosis not present

## 2018-11-01 DIAGNOSIS — Z01812 Encounter for preprocedural laboratory examination: Secondary | ICD-10-CM | POA: Diagnosis present

## 2018-11-01 HISTORY — DX: Anxiety disorder, unspecified: F41.9

## 2018-11-01 LAB — BASIC METABOLIC PANEL
Anion gap: 8 (ref 5–15)
BUN: 38 mg/dL — ABNORMAL HIGH (ref 8–23)
CO2: 25 mmol/L (ref 22–32)
Calcium: 9.4 mg/dL (ref 8.9–10.3)
Chloride: 104 mmol/L (ref 98–111)
Creatinine, Ser: 1.01 mg/dL — ABNORMAL HIGH (ref 0.44–1.00)
GFR calc Af Amer: >60 mL/min (ref >60)
GFR calc non Af Amer: 58 mL/min — ABNORMAL LOW (ref >60)
Glucose, Bld: 96 mg/dL (ref 70–99)
Potassium: 4.5 mmol/L (ref 3.5–5.1)
Sodium: 137 mmol/L (ref 135–145)

## 2018-11-01 LAB — SARS CORONAVIRUS 2 (TAT 6-24 HRS): SARS Coronavirus 2: NEGATIVE

## 2018-11-05 ENCOUNTER — Ambulatory Visit (HOSPITAL_COMMUNITY)
Admission: RE | Admit: 2018-11-05 | Discharge: 2018-11-05 | Disposition: A | Discharge status: Home / Self Care | Payer: BC Managed Care – PPO | Attending: Gastroenterology | Admitting: Gastroenterology

## 2018-11-05 ENCOUNTER — Other Ambulatory Visit: Payer: Self-pay

## 2018-11-05 ENCOUNTER — Ambulatory Visit (HOSPITAL_COMMUNITY): Payer: BC Managed Care – PPO | Admitting: Anesthesiology

## 2018-11-05 ENCOUNTER — Encounter (HOSPITAL_COMMUNITY)
Admission: RE | Disposition: A | Discharge status: Home / Self Care | Payer: Self-pay | Source: Home / Self Care | Attending: Gastroenterology

## 2018-11-05 ENCOUNTER — Encounter (HOSPITAL_COMMUNITY): Payer: Self-pay

## 2018-11-05 DIAGNOSIS — I10 Essential (primary) hypertension: Secondary | ICD-10-CM | POA: Insufficient documentation

## 2018-11-05 DIAGNOSIS — K644 Residual hemorrhoidal skin tags: Secondary | ICD-10-CM | POA: Diagnosis not present

## 2018-11-05 DIAGNOSIS — F419 Anxiety disorder, unspecified: Secondary | ICD-10-CM | POA: Diagnosis not present

## 2018-11-05 DIAGNOSIS — K648 Other hemorrhoids: Secondary | ICD-10-CM | POA: Diagnosis not present

## 2018-11-05 DIAGNOSIS — K5730 Diverticulosis of large intestine without perforation or abscess without bleeding: Secondary | ICD-10-CM | POA: Insufficient documentation

## 2018-11-05 DIAGNOSIS — M1710 Unilateral primary osteoarthritis, unspecified knee: Secondary | ICD-10-CM | POA: Diagnosis not present

## 2018-11-05 DIAGNOSIS — Q438 Other specified congenital malformations of intestine: Secondary | ICD-10-CM | POA: Diagnosis not present

## 2018-11-05 DIAGNOSIS — Z888 Allergy status to other drugs, medicaments and biological substances status: Secondary | ICD-10-CM | POA: Insufficient documentation

## 2018-11-05 DIAGNOSIS — Z885 Allergy status to narcotic agent status: Secondary | ICD-10-CM | POA: Insufficient documentation

## 2018-11-05 DIAGNOSIS — G709 Myoneural disorder, unspecified: Secondary | ICD-10-CM | POA: Diagnosis not present

## 2018-11-05 DIAGNOSIS — F329 Major depressive disorder, single episode, unspecified: Secondary | ICD-10-CM | POA: Insufficient documentation

## 2018-11-05 DIAGNOSIS — Z79899 Other long term (current) drug therapy: Secondary | ICD-10-CM | POA: Insufficient documentation

## 2018-11-05 DIAGNOSIS — Z8371 Family history of colonic polyps: Secondary | ICD-10-CM | POA: Diagnosis not present

## 2018-11-05 DIAGNOSIS — Z1211 Encounter for screening for malignant neoplasm of colon: Secondary | ICD-10-CM | POA: Insufficient documentation

## 2018-11-05 HISTORY — PX: COLONOSCOPY WITH PROPOFOL: SHX5780

## 2018-11-05 SURGERY — COLONOSCOPY WITH PROPOFOL
Anesthesia: General

## 2018-11-05 MED ORDER — PROPOFOL 10 MG/ML IV BOLUS
INTRAVENOUS | Status: AC
  Filled 2018-11-05: qty 40

## 2018-11-05 MED ORDER — CHLORHEXIDINE GLUCONATE CLOTH 2 % EX PADS: 6.0000 | MEDICATED_PAD | Freq: Once | CUTANEOUS | Status: DC

## 2018-11-05 MED ORDER — MIDAZOLAM HCL 2 MG/2ML IJ SOLN
INTRAMUSCULAR | Status: AC
  Filled 2018-11-05: qty 2

## 2018-11-05 MED ORDER — PROPOFOL 500 MG/50ML IV EMUL
INTRAVENOUS | Status: DC | PRN
  Administered 2018-11-05: 150 ug/kg/min via INTRAVENOUS

## 2018-11-05 MED ORDER — LACTATED RINGERS IV SOLN
Freq: Once | INTRAVENOUS | Status: AC
  Administered 2018-11-05: 10:00:00 via INTRAVENOUS

## 2018-11-05 MED ORDER — MIDAZOLAM HCL 5 MG/5ML IJ SOLN
INTRAMUSCULAR | Status: DC | PRN
  Administered 2018-11-05: 2 mg via INTRAVENOUS

## 2018-11-05 NOTE — H&P (Signed)
Primary Care Physician:  Kari Baars, MD Primary Gastroenterologist:  Dr. Darrick Penna  Pre-Procedure History & Physical: HPI:  Linda Page is a 68 y.o. female here for COLON CANCER SCREENING.  Past Medical History:  Diagnosis Date  . Anxiety   . Arthritis    in neck, in knee has gel shots givin in knee once a week for 3 weeks  . Depression   . Hypertension     Past Surgical History:  Procedure Laterality Date  . ANTERIOR CERVICAL DECOMP/DISCECTOMY FUSION N/A 04/29/2015   Procedure: ACDF C4-7;  Surgeon: Venita Lick, MD;  Location: [facility];  Service: Orthopedics;  Laterality: N/A;  . cataract surgery     bilateral  . HAND SURGERY     rt  . KNEE SURGERY     rt    Prior to Admission medications   Medication Sig Start Date End Date Taking? Authorizing Provider  ALPRAZolam Prudy Feeler) 1 MG tablet Take 1 mg by mouth 2 (two) times daily as needed for anxiety.    Yes [provider]  atorvastatin (LIPITOR) 20 MG tablet Take 20 mg by mouth daily.   Yes [provider]  Cholecalciferol (VITAMIN D) 50 MCG (2000 UT) CAPS Take 2,000 Units by mouth daily.    Yes [provider]  Glucosamine-Chondroit-Vit C-Mn (GLUCOSAMINE 1500 COMPLEX PO) Take 1 tablet by mouth daily.    Yes [provider]  ibuprofen (ADVIL) 200 MG tablet Take 400 mg by mouth every 8 (eight) hours as needed (pain).   Yes [provider]  lisinopril-hydrochlorothiazide (PRINZIDE,ZESTORETIC) 20-12.5 MG tablet Take 1 tablet by mouth daily.    Yes [provider]  sertraline (ZOLOFT) 100 MG tablet Take 150 mg by mouth daily.    Yes [provider]    Allergies as of 08/12/2018 - Review Complete 08/12/2018  Allergen Reaction Noted  . Codeine Nausea And Vomiting 04/29/2015  . Other Itching 04/15/2015  . Tramadol Nausea And Vomiting and Other (See Comments) 04/29/2015    Family History  Problem Relation Age of Onset  . Colon polyps Brother   . Colon cancer Neg  Hx     Social History   Socioeconomic History  . Marital status: Widowed    Spouse name: Not on file  . Number of children: Not on file  . Years of education: Not on file  . Highest education level: Not on file  Occupational History  . Not on file  Social Needs  . Financial resource strain: Not on file  . Food insecurity    Worry: Not on file    Inability: Not on file  . Transportation needs    Medical: Not on file    Non-medical: Not on file  Tobacco Use  . Smoking status: Never Smoker  . Smokeless tobacco: Never Used  Substance and Sexual Activity  . Alcohol use: No  . Drug use: No  . Sexual activity: Not on file  Lifestyle  . Physical activity    Days per week: Not on file    Minutes per session: Not on file  . Stress: Not on file  Relationships  . Social Musician on phone: Not on file    Gets together: Not on file    Attends religious service: Not on file    Active member of club or organization: Not on file    Attends meetings of clubs or organizations: Not on file    Relationship status: Not on file  .  Intimate partner violence    Fear of current or ex partner: Not on file    Emotionally abused: Not on file    Physically abused: Not on file    Forced sexual activity: Not on file  Other Topics Concern  . Not on file  Social History Narrative  . Not on file    Review of Systems: See HPI, otherwise negative ROS   Physical Exam: Pulse 79   Resp 19   SpO2 95%  General:   Alert,  pleasant and cooperative in NAD Head:  Normocephalic and atraumatic. Neck:  Supple; Lungs:  Clear throughout to auscultation.    Heart:  Regular rate and rhythm. Abdomen:  Soft, nontender and nondistended. Normal bowel sounds, without guarding, and without rebound.   Neurologic:  Alert and  oriented x4;  grossly normal neurologically.  Impression/Plan:    SCREENING  Plan:  1. TCS TODAY DISCUSSED PROCEDURE, BENEFITS, & RISKS: < 1% chance of medication  reaction, bleeding, perforation, ASPIRATION, or rupture of spleen/liver requiring surgery to fix it and missed polyps < 1 cm 10-20% of the time.

## 2018-11-05 NOTE — Anesthesia Postprocedure Evaluation (Signed)
Anesthesia Post Note  Patient: Linda Page  Procedure(s) Performed: COLONOSCOPY WITH PROPOFOL (N/A )  Patient location during evaluation: PACU Anesthesia Type: MAC Level of consciousness: awake, oriented and awake and alert Pain management: pain level controlled Vital Signs Assessment: post-procedure vital signs reviewed and stable Respiratory status: spontaneous breathing, respiratory function stable and nonlabored ventilation Cardiovascular status: stable Postop Assessment: no apparent nausea or vomiting Anesthetic complications: no     Last Vitals:  Vitals:   11/05/18 0920  Pulse: 79  Resp: 19  SpO2: 95%    Last Pain:  Vitals:   11/05/18 0920  PainSc: 0-No pain                 Merilee Wible

## 2018-11-05 NOTE — Transfer of Care (Signed)
Immediate Anesthesia Transfer of Care Note  Patient: Linda Page  Procedure(s) Performed: COLONOSCOPY WITH PROPOFOL (N/A )  Patient Location: PACU  Anesthesia Type:MAC  Level of Consciousness: awake, alert , oriented and patient cooperative  Airway & Oxygen Therapy: Patient Spontanous Breathing  Post-op Assessment: Report given to RN, Post -op Vital signs reviewed and stable and Patient moving all extremities X 4  Post vital signs: Reviewed and stable  Last Vitals:  Vitals Value Taken Time  BP    Temp    Pulse 74 11/05/18 1106  Resp    SpO2 95 % 11/05/18 1106  Vitals shown include unvalidated device data.  Last Pain:  Vitals:   11/05/18 0920  PainSc: 0-No pain         Complications: No apparent anesthesia complications

## 2018-11-05 NOTE — Anesthesia Preprocedure Evaluation (Signed)
Anesthesia Evaluation  Patient identified by MRN, date of birth, ID band Patient awake    Reviewed: Allergy & Precautions, NPO status , Patient's Chart, lab work & pertinent test results, reviewed documented beta blocker date and time   Airway Mallampati: III  TM Distance: >3 FB Neck ROM: Limited    Dental no notable dental hx.    Pulmonary    Pulmonary exam normal breath sounds clear to auscultation       Cardiovascular hypertension, Pt. on medications Normal cardiovascular exam Rhythm:Regular Rate:Normal     Neuro/Psych PSYCHIATRIC DISORDERS Anxiety Depression  Neuromuscular disease    GI/Hepatic negative GI ROS, Neg liver ROS,   Endo/Other  negative endocrine ROS  Renal/GU negative Renal ROS     Musculoskeletal  (+) Arthritis ,   Abdominal   Peds  Hematology   Anesthesia Other Findings   Reproductive/Obstetrics                             Anesthesia Physical Anesthesia Plan  ASA: II  Anesthesia Plan: General   Post-op Pain Management:    Induction:   PONV Risk Score and Plan: TIVA  Airway Management Planned: Natural Airway, Nasal Cannula and Simple Face Mask  Additional Equipment:   Intra-op Plan:   Post-operative Plan:   Informed Consent:   Plan Discussed with: CRNA  Anesthesia Plan Comments:         Anesthesia Quick Evaluation

## 2018-11-05 NOTE — Op Note (Signed)
[facility] Patient Name: Linda KettleDebra Deer Procedure Date: 11/05/2018 10:15 AM MRN: [id number] Date of Birth: 1950/11/30 Attending MD: Jonette EvaSandi Daemian Gahm MD, MD CSN: [id number] Age: [age] Admit Type: Outpatient Procedure:                Colonoscopy, SCREENING Indications:              Screening for colorectal malignant neoplasm Providers:                Jonette EvaSandi Braedin Millhouse MD, MD, Loma MessingLurae B. Patsy LagerAlbert RN, RN,                            Burke Keelsrisann Tilley, Technician Referring MD:             Oneal DeputyEdward L. Juanetta GoslingHawkins MD, MD Medicines:                Propofol per Anesthesia Complications:            No immediate complications. Estimated Blood Loss:     Estimated blood loss: none. Procedure:                Pre-Anesthesia Assessment:                           - Prior to the procedure, a History and Physical                            was performed, and patient medications and                            allergies were reviewed. The patient's tolerance of                            previous anesthesia was also reviewed. The risks                            and benefits of the procedure and the sedation                            options and risks were discussed with the patient.                            All questions were answered, and informed consent                            was obtained. Prior Anticoagulants: The patient has                            taken no previous anticoagulant or antiplatelet                            agents. ASA Grade Assessment: II - A patient with                            mild systemic disease. After reviewing the risks  and benefits, the patient was deemed in                            satisfactory condition to undergo the procedure.                            After obtaining informed consent, the colonoscope                            was passed under direct vision. Throughout the                            procedure, the patient's blood pressure, pulse,  and                            oxygen saturations were monitored continuously. The                            [id number]-[id number] ([id number]) scope was introduced through                            the anus and advanced to the the cecum, identified                            by appendiceal orifice and ileocecal valve. The                            colonoscopy was somewhat difficult due to a                            tortuous colon. Successful completion of the                            procedure was aided by straightening and shortening                            the scope to obtain bowel loop reduction and                            COLOWRAP. The patient tolerated the procedure well.                            The quality of the bowel preparation was excellent.                            The ileocecal valve, appendiceal orifice, and                            rectum were photographed. Scope In: 10:46:33 AM Scope Out: 11:02:56 AM Scope Withdrawal Time: 0 hours 12 minutes 46 seconds  Total Procedure Duration: 0 hours 16 minutes 23 seconds  Findings:      Multiple small and large-mouthed diverticula were found in the       recto-sigmoid colon and sigmoid colon.  External and internal hemorrhoids were found. The hemorrhoids were       moderate.      The recto-sigmoid colon and sigmoid colon were moderately tortuous. Impression:               - Diverticulosis in the recto-sigmoid colon and in                            the sigmoid colon.                           - External and internal hemorrhoids.                           - MODERATELY Tortuous LEFT colon. Moderate Sedation:      Per Anesthesia Care Recommendation:           - Patient has a contact number available for                            emergencies. The signs and symptoms of potential                            delayed complications were discussed with the                            patient. Return to normal activities tomorrow.                             Written discharge instructions were provided to the                            patient.                           - High fiber diet.                           - Continue present medications.                           - Repeat colonoscopy in 10 years for surveillance. Procedure Code(s):        --- Professional ---                           [phone], Colonoscopy, flexible; diagnostic, including                            collection of specimen(s) by brushing or washing,                            when performed (separate procedure) Diagnosis Code(s):        --- Professional ---                           Z12.11, Encounter for screening for malignant  neoplasm of colon                           K64.8, Other hemorrhoids                           K57.30, Diverticulosis of large intestine without                            perforation or abscess without bleeding                           Q43.8, Other specified congenital malformations of                            intestine CPT copyright 2019 [employer]. All rights reserved. The codes documented in this report are preliminary and upon coder review may  be revised to meet current compliance requirements. Barney Drain, MD Barney Drain MD, MD 11/05/2018 11:27:36 AM This report has been signed electronically. Number of Addenda: 0

## 2018-11-05 NOTE — Discharge Instructions (Signed)
You have small internal hemorrhoids and diverticulosis IN YOUR RIGHT COLON. YOU DID NOT HAVE ANY POLYPS.   EAT TO LIVE AND THINK OF FOOD AS MEDICINE. 75% OF YOUR PLATE SHOULD BE FRUITS/VEGGIES.  To have more energy, and to lose weight:      1. CONTINUE YOUR WEIGHT LOSS EFFORTS. YOUR BODY MASS INDEX IS OVER 30, WHICH MEANS YOU ARE OBESE. OBESITY IS ASSOCIATED WITH AN INCREASED FOR CIRRHOSIS AND ALL CANCERS, INCLUDING CIRRHOSIS, ESOPHAGEAL AND COLON CANCER. I RECOMMEND YOU READ AND FOLLOW RECOMMENDATIONS of DR. MARK HYMAN, "10-DAY DETOX DIET".  MEATS SHOULD BE BAKED, BROILED, OR BOILED. Avoid fried foods. DO NOT EAT FAST FOOD.    2. If you must eat bread, EAT EZEKIEL BREAD. IT IS IN THE FROZEN SECTION OF THE GROCERY STORE.    3. DRINK WATER WITH FRUIT OR CUCUMBER ADDED. YOUR URINE SHOULD BE LIGHT YELLOW. AVOID SODA, GATORADE, ENERGY DRINKS, OR DIET SODA.     4. AVOID HIGH FRUCTOSE CORN SYRUP AND CAFFEINE.     5. DO NOT chew SUGAR FREE GUM OR USE ARTIFICIAL SWEETENERS. IF NEEDED USE STEVIA AS A SWEETENER.    6. DO NOT EAT ENRICHED WHEAT FLOUR, PASTA, RICE, OR CEREAL.    7. ONLY EAT WILD CAUGHT SEAFOOD, GRASS FED BEEF OR CHICKEN, PORK FROM PASTURE RAISE PIGS, OR EGGS FROM PASTURE RAISED CHICKENS.    8. PRACTICE CHAIR YOGA FOR 15-30 MINS 3 OR 4 TIMES A WEEK AND PROGRESS TO HATHA YOGA OVER NEXT 6 MOS.    9. START TAKING A MULTIVITAMIN, VITAMIN B12, AND VITAMIN D3 2000 IU DAILY.   ADDITIONAL SUPPLEMENTS TO DECREASE CRAVING AND SUPPRESS YOUR APPETITE:    1. CINNAMON 500 MG EVERY AM PRIOR TO FIRST MEAL.   **STABILIZES BLOOD GLUCOSE**    2. CHROMIUM 400-500 MG WITH MEALS TWICE DAILY.    **FAT BURNER**    3. GREEN TEA EXTRACT ONE DAILY.   **FAT BURNER/SUPPRESSES YOUR APPETITE**   TAKE IBUPROFEN WITH FOOD OR MILK TO REDUCE RISK OF ULCERS AND BLEEDING FROM YOUR DIVERTCULOSIS. IBUPROFEN ALSO INCREASES YOUR RISK FOR DIVERTICULITIS. Two natural supplements that will help with pain are TURMERIC  AND ALPHA LIPOIC ACID. TAKE THEM EVERY DAY.  USE PREPARATION H UP 4 TIMES A DAY FOR 7 DAYS THEN AS NEEDED TO CONTROL RECTAL BLEEDING, ITCHING, PRESSURE, BURNING, OR PAIN.   Next colonoscopy in 10 years.   Colonoscopy Care After Read the instructions outlined below and refer to this sheet in the next week. These discharge instructions provide you with general information on caring for yourself after you leave the hospital. While your treatment has been planned according to the most current medical practices available, unavoidable complications occasionally occur. If you have any problems or questions after discharge, call DR. Kaysen Deal, [phone].  ACTIVITY  You may resume your regular activity, but move at a slower pace for the next 24 hours.   Take frequent rest periods for the next 24 hours.   Walking will help get rid of the air and reduce the bloated feeling in your belly (abdomen).   No driving for 24 hours (because of the medicine (anesthesia) used during the test).   You may shower.   Do not sign any important legal documents or operate any machinery for 24 hours (because of the anesthesia used during the test).    NUTRITION  Drink plenty of fluids.   You may resume your normal diet as instructed by your doctor.   Begin with a light  meal and progress to your normal diet. Heavy or fried foods are harder to digest and may make you feel sick to your stomach (nauseated).   Avoid alcoholic beverages for 24 hours or as instructed.    MEDICATIONS  You may resume your normal medications.   WHAT YOU CAN EXPECT TODAY  Some feelings of bloating in the abdomen.   Passage of more gas than usual.   Spotting of blood in your stool or on the toilet paper  .  IF YOU HAD POLYPS REMOVED DURING THE COLONOSCOPY:  Eat a soft diet IF YOU HAVE NAUSEA, BLOATING, ABDOMINAL PAIN, OR VOMITING.    FINDING OUT THE RESULTS OF YOUR TEST Not all test results are available during your  visit. DR. Darrick PennaFIELDS WILL CALL YOU WITHIN 7 DAYS OF YOUR PROCEDUE WITH YOUR RESULTS. Do not assume everything is normal if you have not heard from DR. Maximino Cozzolino IN ONE WEEK, CALL HER OFFICE AT [phone].  SEEK IMMEDIATE MEDICAL ATTENTION AND CALL THE OFFICE: [phone] IF:  You have more than a spotting of blood in your stool.   Your belly is swollen (abdominal distention).   You are nauseated or vomiting.   You have a temperature over 101F.   You have abdominal pain or discomfort that is severe or gets worse throughout the day.  High-Fiber Diet A high-fiber diet changes your normal diet to include more whole grains, legumes, fruits, and vegetables. Changes in the diet involve replacing refined carbohydrates with unrefined foods. The calorie level of the diet is essentially unchanged. The Dietary Reference Intake (recommended amount) for adult males is 38 grams per day. For adult females, it is 25 grams per day. Pregnant and lactating women should consume 28 grams of fiber per day. Fiber is the intact part of a plant that is not broken down during digestion. Functional fiber is fiber that has been isolated from the plant to provide a beneficial effect in the body.  PURPOSE Increase stool bulk.  Ease and regulate bowel movements.  Lower cholesterol.  REDUCE RISK OF COLON CANCER  INDICATIONS THAT YOU NEED MORE FIBER Constipation and hemorrhoids.  Uncomplicated diverticulosis (intestine condition) and irritable bowel syndrome.  Weight management.  As a protective measure against hardening of the arteries (atherosclerosis), diabetes, and cancer.   GUIDELINES FOR INCREASING FIBER IN THE DIET Start adding fiber to the diet slowly. A gradual increase of about 5 more grams (2 servings of most fruits or vegetables) per day is best. Too rapid an increase in fiber may result in constipation, flatulence, and bloating.  Drink enough water and fluids to keep your urine clear or pale yellow. Water,  juice, or caffeine-free drinks are recommended. Not drinking enough fluid may cause constipation.  Eat a variety of high-fiber foods rather than one type of fiber.  Try to increase your intake of fiber through using high-fiber foods rather than fiber pills or supplements that contain small amounts of fiber.  The goal is to change the types of food eaten. Do not supplement your present diet with high-fiber foods, but replace foods in your present diet.      Diverticulosis Diverticulosis is a common condition that develops when small pouches (diverticula) form in the wall of the colon. The risk of diverticulosis increases with age. It happens more often in people who eat a low-fiber diet. Most individuals with diverticulosis have no symptoms. Those individuals with symptoms usually experience belly (abdominal) pain, constipation, or loose stools (diarrhea).  HOME CARE INSTRUCTIONS  Increase the amount of fiber in your diet as directed by your caregiver or dietician. This may reduce symptoms of diverticulosis.   Drink at least 6 to 8 glasses of water each day to prevent constipation.   Try not to strain when you have a bowel movement.   Avoiding nuts and seeds to prevent complications is NOT NECESSARY.   FOODS HAVING HIGH FIBER CONTENT INCLUDE:  Fruits. Apple, peach, pear, tangerine, raisins, prunes.   Vegetables. Brussels sprouts, asparagus, broccoli, cabbage, carrot, cauliflower, romaine lettuce, spinach, summer squash, tomato, winter squash, zucchini.   Starchy Vegetables. Baked beans, kidney beans, lima beans, split peas, lentils, potatoes (with skin).     SEEK IMMEDIATE MEDICAL CARE IF:  You develop increasing pain or severe bloating.   You have an oral temperature above 101F.   You develop vomiting or bowel movements that are bloody or black.

## 2018-11-08 ENCOUNTER — Encounter (HOSPITAL_COMMUNITY): Payer: Self-pay | Admitting: Gastroenterology

## 2018-12-12 ENCOUNTER — Other Ambulatory Visit (HOSPITAL_COMMUNITY): Payer: Self-pay | Admitting: Internal Medicine

## 2018-12-12 DIAGNOSIS — Z13820 Encounter for screening for osteoporosis: Secondary | ICD-10-CM

## 2018-12-12 DIAGNOSIS — Z1231 Encounter for screening mammogram for malignant neoplasm of breast: Secondary | ICD-10-CM

## 2019-01-13 ENCOUNTER — Ambulatory Visit: Payer: BC Managed Care – PPO | Attending: Internal Medicine

## 2019-01-13 ENCOUNTER — Other Ambulatory Visit: Payer: Self-pay

## 2019-01-13 DIAGNOSIS — Z20822 Contact with and (suspected) exposure to covid-19: Secondary | ICD-10-CM

## 2019-01-14 LAB — NOVEL CORONAVIRUS, NAA: SARS-CoV-2, NAA: DETECTED — AB

## 2019-01-30 ENCOUNTER — Ambulatory Visit (HOSPITAL_COMMUNITY): Payer: BC Managed Care – PPO

## 2019-01-30 ENCOUNTER — Other Ambulatory Visit (HOSPITAL_COMMUNITY): Payer: BC Managed Care – PPO

## 2019-02-28 ENCOUNTER — Other Ambulatory Visit (HOSPITAL_COMMUNITY): Payer: BC Managed Care – PPO

## 2019-02-28 ENCOUNTER — Ambulatory Visit (HOSPITAL_COMMUNITY): Payer: BC Managed Care – PPO

## 2019-03-24 ENCOUNTER — Ambulatory Visit: Payer: Self-pay | Admitting: Orthopedic Surgery

## 2019-03-25 ENCOUNTER — Ambulatory Visit: Payer: Self-pay | Admitting: Orthopedic Surgery

## 2019-03-25 NOTE — H&P (View-Only) (Signed)
TOTAL KNEE ADMISSION H&P  Patient is being admitted for right total knee arthroplasty.  Subjective:  Chief Complaint:right knee pain.  HPI: Linda Page, 68 y.o. female, has a history of pain and functional disability in the right knee due to arthritis and has failed non-surgical conservative treatments for greater than 12 weeks to includeNSAID's and/or analgesics, corticosteriod injections, viscosupplementation injections, flexibility and strengthening excercises, supervised PT with diminished ADL's post treatment, use of assistive devices and activity modification.  Onset of symptoms was gradual, starting >10 years ago with gradually worsening course since that time. The patient noted prior procedures on the knee to include  arthroscopy on the right knee(s).  Patient currently rates pain in the right knee(s) at 10 out of 10 with activity. Patient has night pain, worsening of pain with activity and weight bearing, pain that interferes with activities of daily living, pain with passive range of motion, crepitus and joint swelling.  Patient has evidence of subchondral cysts, subchondral sclerosis, periarticular osteophytes, joint subluxation and joint space narrowing by imaging studies. There is no active infection.  Patient Active Problem List   Diagnosis Date Noted  . Preventative health care 08/12/2018  . Neck pain 04/29/2015  . Degeneration of cervical intervertebral disc 09/24/2013  . Pain in joint, shoulder region 09/24/2013  . Muscle weakness (generalized) 09/24/2013  . Decreased range of motion of neck 09/24/2013   Past Medical History:  Diagnosis Date  . Anxiety   . Arthritis    in neck, in knee has gel shots givin in knee once a week for 3 weeks  . Depression   . Hypertension     Past Surgical History:  Procedure Laterality Date  . ANTERIOR CERVICAL DECOMP/DISCECTOMY FUSION N/A 04/29/2015   Procedure: ACDF C4-7;  Surgeon: Dahari Brooks, MD;  Location: [facility] OR;  Service:  Orthopedics;  Laterality: N/A;  . cataract surgery     bilateral  . COLONOSCOPY WITH PROPOFOL N/A 11/05/2018   Procedure: COLONOSCOPY WITH PROPOFOL;  Surgeon: Fields, Sandi L, MD;  Location: [facility];  Service: Endoscopy;  Laterality: N/A;  10:30am  . HAND SURGERY     rt  . KNEE SURGERY     rt    Current Outpatient Medications  Medication Sig Dispense Refill Last Dose  . ALPRAZolam (XANAX) 1 MG tablet Take 1 mg by mouth 2 (two) times daily as needed for anxiety.      . atorvastatin (LIPITOR) 20 MG tablet Take 20 mg by mouth daily.     . Cholecalciferol (VITAMIN D) 50 MCG (2000 UT) CAPS Take 2,000 Units by mouth daily.      . Glucosamine-Chondroit-Vit C-Mn (GLUCOSAMINE 1500 COMPLEX PO) Take 1 tablet by mouth daily.      . ibuprofen (ADVIL) 200 MG tablet Take 400 mg by mouth every 8 (eight) hours as needed (pain).     . lisinopril-hydrochlorothiazide (PRINZIDE,ZESTORETIC) 20-12.5 MG tablet Take 1 tablet by mouth daily.      . sertraline (ZOLOFT) 100 MG tablet Take 150 mg by mouth daily.       No current facility-administered medications for this visit.   Allergies  Allergen Reactions  . Codeine Nausea And Vomiting  . Other Itching    The patient stated that she was given cortisone shots in her neck whatever was used to numb the area caused her to itch.  . Tramadol Nausea And Vomiting and Other (See Comments)    "It hypes me up"    Social History   Tobacco   Use  . Smoking status: Never Smoker  . Smokeless tobacco: Never Used  Substance Use Topics  . Alcohol use: No    Family History  Problem Relation Age of Onset  . Colon polyps Brother        age 90  . Colon cancer Neg Hx      Review of Systems  Constitutional: Negative.   HENT: Negative.   Eyes: Negative.   Respiratory: Negative.   Cardiovascular: Negative.   Gastrointestinal: Negative.   Endocrine: Negative.   Genitourinary: Negative.   Musculoskeletal: Positive for arthralgias, back pain and myalgias.   Skin: Negative.   Allergic/Immunologic: Negative.   Neurological: Negative.   Hematological: Negative.   Psychiatric/Behavioral: Negative.     Objective:  Physical Exam  Vitals reviewed. Constitutional: She is oriented to person, place, and time. She appears well-developed and well-nourished.  HENT:  Head: Normocephalic and atraumatic.  Eyes: Pupils are equal, round, and reactive to light. Conjunctivae and EOM are normal.  Cardiovascular: Normal rate, regular rhythm and intact distal pulses.  Respiratory: Effort normal. No respiratory distress.  GI: Soft. She exhibits no distension.  Genitourinary:    Genitourinary Comments: deferred   Musculoskeletal:     Cervical back: Normal range of motion and neck supple.     Right knee: Decreased range of motion. Tenderness present over the medial joint line. Abnormal alignment.  Neurological: She is alert and oriented to person, place, and time. She has normal reflexes.  Skin: Skin is warm and dry.  Psychiatric: She has a normal mood and affect. Her behavior is normal. Judgment and thought content normal.    Vital signs in last 24 hours: @VSRANGES @  Labs:   Estimated body mass index is 35.53 kg/m as calculated from the following:   Height as of 11/01/18: 5\' 4"  (1.626 m).   Weight as of 11/01/18: 93.9 kg.   Imaging Review Plain radiographs demonstrate severe degenerative joint disease of the right knee(s). The overall alignment issignificant valgus. The bone quality appears to be adequate for age and reported activity level.      Assessment/Plan:  End stage arthritis, right knee   The patient history, physical examination, clinical judgment of the provider and imaging studies are consistent with end stage degenerative joint disease of the right knee(s) and total knee arthroplasty is deemed medically necessary. The treatment options including medical management, injection therapy arthroscopy and arthroplasty were discussed at  length. The risks and benefits of total knee arthroplasty were presented and reviewed. The risks due to aseptic loosening, infection, stiffness, patella tracking problems, thromboembolic complications and other imponderables were discussed. The patient acknowledged the explanation, agreed to proceed with the plan and consent was signed. Patient is being admitted for inpatient treatment for surgery, pain control, PT, OT, prophylactic antibiotics, VTE prophylaxis, progressive ambulation and ADL's and discharge planning. The patient is planning to be discharged home with oppt   Wants o/n stay for pain control  Patient's anticipated LOS is less than 2 midnights, meeting these requirements: - Younger than 61 - Lives within 1 hour of care - Has a competent adult at home to recover with post-op recover - NO history of  - Chronic pain requiring opiods  - Diabetes  - Coronary Artery Disease  - Heart failure  - Heart attack  - Stroke  - DVT/VTE  - Cardiac arrhythmia  - Respiratory Failure/COPD  - Renal failure  - Anemia  - Advanced Liver disease

## 2019-03-25 NOTE — Patient Instructions (Signed)
DUE TO COVID-19 ONLY ONE VISITOR IS ALLOWED TO COME WITH YOU AND STAY IN THE WAITING ROOM ONLY DURING PRE OP AND PROCEDURE DAY OF SURGERY. THE 1 VISITOR MAY VISIT WITH YOU AFTER SURGERY IN YOUR PRIVATE ROOM DURING VISITING HOURS ONLY!  YOU NEED TO HAVE A COVID 19 TEST ON__3/22_____ @__1 :30_____, THIS TEST MUST BE DONE BEFORE SURGERY, COME  [address] [place] , [id number].  ([place]) ONCE YOUR COVID TEST IS COMPLETED, PLEASE BEGIN THE QUARANTINE INSTRUCTIONS AS OUTLINED IN YOUR HANDOUT.                Linda Page    Your procedure is scheduled on: 04/03/19   Report to [facility] Main  Entrance   Report to Short stay at 5:30 AM     Call this number if you have problems the morning of surgery [place], NO CHEWING GUM [place].   Do not eat food After Midnight.   YOU MAY HAVE CLEAR LIQUIDS FROM MIDNIGHT UNTIL 4:30AM  . At 4:30AM Please finish the prescribed Pre-Surgery  drink.   Nothing by mouth after you finish the drink !    Take these medicines the morning of surgery with A SIP OF WATER: Zoloft                               You may not have any metal on your body including hair pins and              piercings  Do not wear jewelry, make-up, lotions, powders or perfumes, deodorant             Do not wear nail polish on your fingernails.  Do not shave  48 hours prior to surgery.     Do not bring valuables to the hospital. [place].  Contacts, dentures or bridgework may not be worn into surgery.     Patients discharged the day of surgery will not be allowed to drive home.   IF YOU ARE HAVING SURGERY AND GOING HOME THE SAME DAY, YOU MUST HAVE AN ADULT TO DRIVE YOU HOME AND BE WITH YOU FOR 24 HOURS.   YOU MAY GO HOME BY TAXI OR UBER OR ORTHERWISE, BUT AN ADULT MUST ACCOMPANY YOU HOME AND STAY WITH YOU FOR 24  HOURS.  Name and phone number of your driver:  Special Instructions: N/A              Please read over the following fact sheets you were given: _____________________________________________________________________             [facility] - Preparing for Surgery  Before surgery, you can play an important role.   Because skin is not sterile, your skin needs to be as free of germs as possible.   You can reduce the number of germs on your skin by washing with CHG (chlorahexidine gluconate) soap before surgery.   CHG is an antiseptic cleaner which kills germs and bonds with the skin to continue killing germs even after washing. Please DO NOT use if you have an allergy to CHG or antibacterial soaps.   If your skin becomes reddened/irritated stop using the CHG and inform your nurse when you  arrive at Short Stay. Do not shave (including legs and underarms) for at least 48 hours prior to the first CHG shower.   Please follow these instructions carefully:  1.  Shower with CHG Soap the night before surgery and the  morning of Surgery.  2.  If you choose to wash your hair, wash your hair first as usual with your  normal  shampoo.  3.  After you shampoo, rinse your hair and body thoroughly to remove the  shampoo.                                        4.  Use CHG as you would any other liquid soap.  You can apply chg directly  to the skin and wash                       Gently with a scrungie or clean washcloth.  5.  Apply the CHG Soap to your body ONLY FROM THE NECK DOWN.   Do not use on face/ open                           Wound or open sores. Avoid contact with eyes, ears mouth and genitals (private parts).                       Wash face,  Genitals (private parts) with your normal soap.             6.  Wash thoroughly, paying special attention to the area where your surgery  will be performed.  7.  Thoroughly rinse your body with warm water from the neck down.  8.  DO NOT shower/wash with your  normal soap after using and rinsing off  the CHG Soap.             9.  Pat yourself dry with a clean towel.            10.  Wear clean pajamas.            11.  Place clean sheets on your bed the night of your first shower and do not  sleep with pets. Day of Surgery : Do not apply any lotions/deodorants the morning of surgery.  Please wear clean clothes to the hospital/surgery center.  FAILURE TO FOLLOW THESE INSTRUCTIONS MAY RESULT IN THE CANCELLATION OF YOUR SURGERY PATIENT SIGNATURE_________________________________  NURSE SIGNATURE__________________________________  ________________________________________________________________________   Linda Page  An incentive spirometer is a tool that can help keep your lungs clear and active. This tool measures how well you are filling your lungs with each breath. Taking long deep breaths may help reverse or decrease the chance of developing breathing (pulmonary) problems (especially infection) following:  A long period of time when you are unable to move or be active. BEFORE THE PROCEDURE   If the spirometer includes an indicator to show your best effort, your nurse or respiratory therapist will set it to a desired goal.  If possible, sit up straight or lean slightly forward. Try not to slouch.  Hold the incentive spirometer in an upright position. INSTRUCTIONS FOR USE  1. Sit on the edge of your bed if possible, or sit up as far as you can in bed or on a chair. 2. Hold the incentive spirometer in an upright  position. 3. Breathe out normally. 4. Place the mouthpiece in your mouth and seal your lips tightly around it. 5. Breathe in slowly and as deeply as possible, raising the piston or the ball toward the top of the column. 6. Hold your breath for 3-5 seconds or for as long as possible. Allow the piston or ball to fall to the bottom of the column. 7. Remove the mouthpiece from your mouth and breathe out normally. 8. Rest for a  few seconds and repeat Steps 1 through 7 at least 10 times every 1-2 hours when you are awake. Take your time and take a few normal breaths between deep breaths. 9. The spirometer may include an indicator to show your best effort. Use the indicator as a goal to work toward during each repetition. 10. After each set of 10 deep breaths, practice coughing to be sure your lungs are clear. If you have an incision (the cut made at the time of surgery), support your incision when coughing by placing a pillow or rolled up towels firmly against it. Once you are able to get out of bed, walk around indoors and cough well. You may stop using the incentive spirometer when instructed by your caregiver.  RISKS AND COMPLICATIONS  Take your time so you do not get dizzy or light-headed.  If you are in pain, you may need to take or ask for pain medication before doing incentive spirometry. It is harder to take a deep breath if you are having pain. AFTER USE  Rest and breathe slowly and easily.  It can be helpful to keep track of a log of your progress. Your caregiver can provide you with a simple table to help with this. If you are using the spirometer at home, follow these instructions: SEEK MEDICAL CARE IF:   You are having difficultly using the spirometer.  You have trouble using the spirometer as often as instructed.  Your pain medication is not giving enough relief while using the spirometer.  You develop fever of 100.5 F (38.1 C) or higher. SEEK IMMEDIATE MEDICAL CARE IF:   You cough up bloody sputum that had not been present before.  You develop fever of 102 F (38.9 C) or greater.  You develop worsening pain at or near the incision site. MAKE SURE YOU:   Understand these instructions.  Will watch your condition.  Will get help right away if you are not doing well or get worse. Document Released: 05/08/2006 Document Revised: 03/20/2011 Document Reviewed: 07/09/2006 ExitCare Patient  Information 2014 ExitCare, [place].   ________________________________________________________________________  WHAT IS A BLOOD TRANSFUSION? Blood Transfusion Information  A transfusion is the replacement of blood or some of its parts. Blood is made up of multiple cells which provide different functions.  Red blood cells carry oxygen and are used for blood loss replacement.  White blood cells fight against infection.  Platelets control bleeding.  Plasma helps clot blood.  Other blood products are available for specialized needs, such as hemophilia or other clotting disorders. BEFORE THE TRANSFUSION  Who gives blood for transfusions?   Healthy volunteers who are fully evaluated to make sure their blood is safe. This is blood bank blood. Transfusion therapy is the safest it has ever been in the practice of medicine. Before blood is taken from a donor, a complete history is taken to make sure that person has no history of diseases nor engages in risky social behavior (examples are intravenous drug use or sexual activity with multiple  partners). The donor's travel history is screened to minimize risk of transmitting infections, such as malaria. The donated blood is tested for signs of infectious diseases, such as HIV and hepatitis. The blood is then tested to be sure it is compatible with you in order to minimize the chance of a transfusion reaction. If you or a relative donates blood, this is often done in anticipation of surgery and is not appropriate for emergency situations. It takes many days to process the donated blood. RISKS AND COMPLICATIONS Although transfusion therapy is very safe and saves many lives, the main dangers of transfusion include:   Getting an infectious disease.  Developing a transfusion reaction. This is an allergic reaction to something in the blood you were given. Every precaution is taken to prevent this. The decision to have a blood transfusion has been considered  carefully by your caregiver before blood is given. Blood is not given unless the benefits outweigh the risks. AFTER THE TRANSFUSION  Right after receiving a blood transfusion, you will usually feel much better and more energetic. This is especially true if your red blood cells have gotten low (anemic). The transfusion raises the level of the red blood cells which carry oxygen, and this usually causes an energy increase.  The nurse administering the transfusion will monitor you carefully for complications. HOME CARE INSTRUCTIONS  No special instructions are needed after a transfusion. You may find your energy is better. Speak with your caregiver about any limitations on activity for underlying diseases you may have. SEEK MEDICAL CARE IF:   Your condition is not improving after your transfusion.  You develop redness or irritation at the intravenous (IV) site. SEEK IMMEDIATE MEDICAL CARE IF:  Any of the following symptoms occur over the next 12 hours:  Shaking chills.  You have a temperature by mouth above 102 F (38.9 C), not controlled by medicine.  Chest, back, or muscle pain.  People around you feel you are not acting correctly or are confused.  Shortness of breath or difficulty breathing.  Dizziness and fainting.  You get a rash or develop hives.  You have a decrease in urine output.  Your urine turns a dark color or changes to pink, red, or brown. Any of the following symptoms occur over the next 10 days:  You have a temperature by mouth above 102 F (38.9 C), not controlled by medicine.  Shortness of breath.  Weakness after normal activity.  The white part of the eye turns yellow (jaundice).  You have a decrease in the amount of urine or are urinating less often.  Your urine turns a dark color or changes to pink, red, or brown. Document Released: 12/24/1999 Document Revised: 03/20/2011 Document Reviewed: 08/12/2007 [facility] Patient Information 2014 [place],  [place].  _______________________________________________________________________

## 2019-03-25 NOTE — H&P (Signed)
TOTAL KNEE ADMISSION H&P  Patient is being admitted for right total knee arthroplasty.  Subjective:  Chief Complaint:right knee pain.  HPI: ERRIKA Page, 69 y.o. female, has a history of pain and functional disability in the right knee due to arthritis and has failed non-surgical conservative treatments for greater than 12 weeks to includeNSAID's and/or analgesics, corticosteriod injections, viscosupplementation injections, flexibility and strengthening excercises, supervised PT with diminished ADL's post treatment, use of assistive devices and activity modification.  Onset of symptoms was gradual, starting >10 years ago with gradually worsening course since that time. The patient noted prior procedures on the knee to include  arthroscopy on the right knee(s).  Patient currently rates pain in the right knee(s) at 10 out of 10 with activity. Patient has night pain, worsening of pain with activity and weight bearing, pain that interferes with activities of daily living, pain with passive range of motion, crepitus and joint swelling.  Patient has evidence of subchondral cysts, subchondral sclerosis, periarticular osteophytes, joint subluxation and joint space narrowing by imaging studies. There is no active infection.  Patient Active Problem List   Diagnosis Date Noted  . Preventative health care 08/12/2018  . Neck pain 04/29/2015  . Degeneration of cervical intervertebral disc 09/24/2013  . Pain in joint, shoulder region 09/24/2013  . Muscle weakness (generalized) 09/24/2013  . Decreased range of motion of neck 09/24/2013   Past Medical History:  Diagnosis Date  . Anxiety   . Arthritis    in neck, in knee has gel shots givin in knee once a week for 3 weeks  . Depression   . Hypertension     Past Surgical History:  Procedure Laterality Date  . ANTERIOR CERVICAL DECOMP/DISCECTOMY FUSION N/A 04/29/2015   Procedure: ACDF C4-7;  Surgeon: Melina Schools, MD;  Location: [facility];  Service:  Orthopedics;  Laterality: N/A;  . cataract surgery     bilateral  . COLONOSCOPY WITH PROPOFOL N/A 11/05/2018   Procedure: COLONOSCOPY WITH PROPOFOL;  Surgeon: Danie Binder, MD;  [facility];  Service: Endoscopy;  Laterality: N/A;  10:30am  . HAND SURGERY     rt  . KNEE SURGERY     rt    Current Outpatient Medications  Medication Sig Dispense Refill Last Dose  . ALPRAZolam (XANAX) 1 MG tablet Take 1 mg by mouth 2 (two) times daily as needed for anxiety.      Marland Kitchen atorvastatin (LIPITOR) 20 MG tablet Take 20 mg by mouth daily.     . Cholecalciferol (VITAMIN D) 50 MCG (2000 UT) CAPS Take 2,000 Units by mouth daily.      . Glucosamine-Chondroit-Vit C-Mn (GLUCOSAMINE 1500 COMPLEX PO) Take 1 tablet by mouth daily.      Marland Kitchen ibuprofen (ADVIL) 200 MG tablet Take 400 mg by mouth every 8 (eight) hours as needed (pain).     Marland Kitchen lisinopril-hydrochlorothiazide (PRINZIDE,ZESTORETIC) 20-12.5 MG tablet Take 1 tablet by mouth daily.      . sertraline (ZOLOFT) 100 MG tablet Take 150 mg by mouth daily.       No current facility-administered medications for this visit.   Allergies  Allergen Reactions  . Codeine Nausea And Vomiting  . Other Itching    The patient stated that she was given cortisone shots in her neck whatever was used to numb the area caused her to itch.  . Tramadol Nausea And Vomiting and Other (See Comments)    "It hypes me up"    Social History   Tobacco  Use  . Smoking status: Never Smoker  . Smokeless tobacco: Never Used  Substance Use Topics  . Alcohol use: No    Family History  Problem Relation Age of Onset  . Colon polyps Brother        age 90  . Colon cancer Neg Hx      Review of Systems  Constitutional: Negative.   HENT: Negative.   Eyes: Negative.   Respiratory: Negative.   Cardiovascular: Negative.   Gastrointestinal: Negative.   Endocrine: Negative.   Genitourinary: Negative.   Musculoskeletal: Positive for arthralgias, back pain and myalgias.   Skin: Negative.   Allergic/Immunologic: Negative.   Neurological: Negative.   Hematological: Negative.   Psychiatric/Behavioral: Negative.     Objective:  Physical Exam  Vitals reviewed. Constitutional: She is oriented to person, place, and time. She appears well-developed and well-nourished.  HENT:  Head: Normocephalic and atraumatic.  Eyes: Pupils are equal, round, and reactive to light. Conjunctivae and EOM are normal.  Cardiovascular: Normal rate, regular rhythm and intact distal pulses.  Respiratory: Effort normal. No respiratory distress.  GI: Soft. She exhibits no distension.  Genitourinary:    Genitourinary Comments: deferred   Musculoskeletal:     Cervical back: Normal range of motion and neck supple.     Right knee: Decreased range of motion. Tenderness present over the medial joint line. Abnormal alignment.  Neurological: She is alert and oriented to person, place, and time. She has normal reflexes.  Skin: Skin is warm and dry.  Psychiatric: She has a normal mood and affect. Her behavior is normal. Judgment and thought content normal.    Vital signs in last 24 hours: @VSRANGES @  Labs:   Estimated body mass index is 35.53 kg/m as calculated from the following:   Height as of 11/01/18: 5\' 4"  (1.626 m).   Weight as of 11/01/18: 93.9 kg.   Imaging Review Plain radiographs demonstrate severe degenerative joint disease of the right knee(s). The overall alignment issignificant valgus. The bone quality appears to be adequate for age and reported activity level.      Assessment/Plan:  End stage arthritis, right knee   The patient history, physical examination, clinical judgment of the provider and imaging studies are consistent with end stage degenerative joint disease of the right knee(s) and total knee arthroplasty is deemed medically necessary. The treatment options including medical management, injection therapy arthroscopy and arthroplasty were discussed at  length. The risks and benefits of total knee arthroplasty were presented and reviewed. The risks due to aseptic loosening, infection, stiffness, patella tracking problems, thromboembolic complications and other imponderables were discussed. The patient acknowledged the explanation, agreed to proceed with the plan and consent was signed. Patient is being admitted for inpatient treatment for surgery, pain control, PT, OT, prophylactic antibiotics, VTE prophylaxis, progressive ambulation and ADL's and discharge planning. The patient is planning to be discharged home with oppt   Wants o/n stay for pain control  Patient's anticipated LOS is less than 2 midnights, meeting these requirements: - Younger than 61 - Lives within 1 hour of care - Has a competent adult at home to recover with post-op recover - NO history of  - Chronic pain requiring opiods  - Diabetes  - Coronary Artery Disease  - Heart failure  - Heart attack  - Stroke  - DVT/VTE  - Cardiac arrhythmia  - Respiratory Failure/COPD  - Renal failure  - Anemia  - Advanced Liver disease

## 2019-03-26 ENCOUNTER — Encounter (HOSPITAL_COMMUNITY): Payer: Self-pay

## 2019-03-26 ENCOUNTER — Other Ambulatory Visit: Payer: Self-pay

## 2019-03-26 ENCOUNTER — Encounter (HOSPITAL_COMMUNITY)
Admission: RE | Admit: 2019-03-26 | Discharge: 2019-03-26 | Disposition: A | Discharge status: Home / Self Care | Payer: BC Managed Care – PPO | Source: Ambulatory Visit | Attending: Orthopedic Surgery | Admitting: Orthopedic Surgery

## 2019-03-26 DIAGNOSIS — I10 Essential (primary) hypertension: Secondary | ICD-10-CM | POA: Insufficient documentation

## 2019-03-26 DIAGNOSIS — Z01818 Encounter for other preprocedural examination: Secondary | ICD-10-CM | POA: Diagnosis present

## 2019-03-26 NOTE — Progress Notes (Signed)
PCP - Dr. Dwana Melena Cardiologist - no  Chest x-ray - no EKG - 03/02/19 Stress Test - no ECHO - no Cardiac Cath - no  Sleep Study - no CPAP -   Fasting Blood Sugar - NA Checks Blood Sugar _____ times a day  Blood Thinner Instructions:NA Aspirin Instructions: Last Dose:  Anesthesia review:   Patient denies shortness of breath, fever, cough and chest pain at PAT appointment  yes Patient verbalized understanding of instructions that were given to them at the PAT appointment. Patient was also instructed that they will need to review over the PAT instructions again at home before surgery. Yes  Pt has had a cervical fusion in 2017. She has full ROM.

## 2019-03-27 ENCOUNTER — Encounter (HOSPITAL_COMMUNITY)
Admission: RE | Admit: 2019-03-27 | Discharge: 2019-03-27 | Disposition: A | Discharge status: Home / Self Care | Payer: BC Managed Care – PPO | Source: Ambulatory Visit | Attending: Orthopedic Surgery | Admitting: Orthopedic Surgery

## 2019-03-27 DIAGNOSIS — Z01818 Encounter for other preprocedural examination: Secondary | ICD-10-CM | POA: Diagnosis not present

## 2019-03-27 LAB — URINALYSIS, ROUTINE W REFLEX MICROSCOPIC
Bilirubin Urine: NEGATIVE
Glucose, UA: NEGATIVE mg/dL
Hgb urine dipstick: NEGATIVE
Ketones, ur: NEGATIVE mg/dL
Nitrite: NEGATIVE
Protein, ur: NEGATIVE mg/dL
Specific Gravity, Urine: 1.017 (ref 1.005–1.030)
pH: 5 (ref 5.0–8.0)

## 2019-03-27 LAB — CBC
HCT: 37.4 % (ref 36.0–46.0)
Hemoglobin: 12 g/dL (ref 12.0–15.0)
MCH: 30.1 pg (ref 26.0–34.0)
MCHC: 32.1 g/dL (ref 30.0–36.0)
MCV: 93.7 fL (ref 80.0–100.0)
Platelets: 316 10*3/uL (ref 150–400)
RBC: 3.99 MIL/uL (ref 3.87–5.11)
RDW: 12.5 % (ref 11.5–15.5)
WBC: 8.5 10*3/uL (ref 4.0–10.5)
nRBC: 0 % (ref 0.0–0.2)

## 2019-03-27 LAB — COMPREHENSIVE METABOLIC PANEL
ALT: 34 U/L (ref 0–44)
AST: 27 U/L (ref 15–41)
Albumin: 4.5 g/dL (ref 3.5–5.0)
Alkaline Phosphatase: 60 U/L (ref 38–126)
Anion gap: 8 (ref 5–15)
BUN: 37 mg/dL — ABNORMAL HIGH (ref 8–23)
CO2: 29 mmol/L (ref 22–32)
Calcium: 9.9 mg/dL (ref 8.9–10.3)
Chloride: 102 mmol/L (ref 98–111)
Creatinine, Ser: 1.06 mg/dL — ABNORMAL HIGH (ref 0.44–1.00)
GFR calc Af Amer: >60 mL/min (ref >60)
GFR calc non Af Amer: 55 mL/min — ABNORMAL LOW (ref >60)
Glucose, Bld: 105 mg/dL — ABNORMAL HIGH (ref 70–99)
Potassium: 4.4 mmol/L (ref 3.5–5.1)
Sodium: 139 mmol/L (ref 135–145)
Total Bilirubin: 0.5 mg/dL (ref 0.3–1.2)
Total Protein: 8.2 g/dL — ABNORMAL HIGH (ref 6.5–8.1)

## 2019-03-27 LAB — SURGICAL PCR SCREEN
MRSA, PCR: NEGATIVE
Staphylococcus aureus: NEGATIVE

## 2019-03-27 LAB — PROTIME-INR
INR: 1 (ref 0.8–1.2)
Prothrombin Time: 12.5 seconds (ref 11.4–15.2)

## 2019-03-27 LAB — ABO/RH: ABO/RH(D): A NEG

## 2019-03-31 ENCOUNTER — Other Ambulatory Visit (HOSPITAL_COMMUNITY): Payer: BC Managed Care – PPO

## 2019-04-02 MED ORDER — DEXTROSE 5 % IV SOLN
3.0000 g | INTRAVENOUS | Status: AC
  Administered 2019-04-03: 3 g via INTRAVENOUS
  Filled 2019-04-02: qty 3

## 2019-04-02 NOTE — Anesthesia Preprocedure Evaluation (Addendum)
Anesthesia Evaluation  Patient identified by MRN, date of birth, ID band Patient awake    Reviewed: Allergy & Precautions, NPO status , Patient's Chart, lab work & pertinent test results  Airway Mallampati: III  TM Distance: >3 FB Neck ROM: Full    Dental   Pulmonary neg pulmonary ROS,    breath sounds clear to auscultation       Cardiovascular hypertension, Pt. on medications  Rhythm:Regular Rate:Normal     Neuro/Psych negative neurological ROS     GI/Hepatic negative GI ROS, Neg liver ROS,   Endo/Other  negative endocrine ROS  Renal/GU negative Renal ROS     Musculoskeletal  (+) Arthritis ,   Abdominal   Peds  Hematology negative hematology ROS (+)   Anesthesia Other Findings   Reproductive/Obstetrics                            Lab Results  Component Value Date   WBC 8.5 03/27/2019   HGB 12.0 03/27/2019   HCT 37.4 03/27/2019   MCV 93.7 03/27/2019   PLT 316 03/27/2019   Lab Results  Component Value Date   CREATININE 1.06 (H) 03/27/2019   BUN 37 (H) 03/27/2019   NA 139 03/27/2019   K 4.4 03/27/2019   CL 102 03/27/2019   CO2 29 03/27/2019    Anesthesia Physical Anesthesia Plan  ASA: II  Anesthesia Plan: Spinal   Post-op Pain Management:  Regional for Post-op pain   Induction:   PONV Risk Score and Plan: 2 and Propofol infusion, Ondansetron and Treatment may vary due to age or medical condition  Airway Management Planned: Natural Airway and Simple Face Mask  Additional Equipment:   Intra-op Plan:   Post-operative Plan:   Informed Consent: I have reviewed the patients History and Physical, chart, labs and discussed the procedure including the risks, benefits and alternatives for the proposed anesthesia with the patient or authorized representative who has indicated his/her understanding and acceptance.       Plan Discussed with: CRNA  Anesthesia Plan Comments:         Anesthesia Quick Evaluation

## 2019-04-03 ENCOUNTER — Encounter (HOSPITAL_COMMUNITY)
Admission: RE | Disposition: A | Discharge status: Home / Self Care | Payer: Self-pay | Source: Home / Self Care | Attending: Orthopedic Surgery

## 2019-04-03 ENCOUNTER — Encounter (HOSPITAL_COMMUNITY): Payer: Self-pay | Admitting: Orthopedic Surgery

## 2019-04-03 ENCOUNTER — Ambulatory Visit (HOSPITAL_COMMUNITY): Payer: BC Managed Care – PPO | Admitting: Anesthesiology

## 2019-04-03 ENCOUNTER — Observation Stay (HOSPITAL_COMMUNITY): Payer: BC Managed Care – PPO

## 2019-04-03 ENCOUNTER — Observation Stay (HOSPITAL_COMMUNITY)
Admission: RE | Admit: 2019-04-03 | Discharge: 2019-04-04 | Disposition: A | Discharge status: Home / Self Care | Payer: BC Managed Care – PPO | Attending: Orthopedic Surgery | Admitting: Orthopedic Surgery

## 2019-04-03 DIAGNOSIS — F419 Anxiety disorder, unspecified: Secondary | ICD-10-CM | POA: Insufficient documentation

## 2019-04-03 DIAGNOSIS — F329 Major depressive disorder, single episode, unspecified: Secondary | ICD-10-CM | POA: Diagnosis not present

## 2019-04-03 DIAGNOSIS — I10 Essential (primary) hypertension: Secondary | ICD-10-CM | POA: Insufficient documentation

## 2019-04-03 DIAGNOSIS — Z888 Allergy status to other drugs, medicaments and biological substances status: Secondary | ICD-10-CM | POA: Insufficient documentation

## 2019-04-03 DIAGNOSIS — Z79899 Other long term (current) drug therapy: Secondary | ICD-10-CM | POA: Insufficient documentation

## 2019-04-03 DIAGNOSIS — M1711 Unilateral primary osteoarthritis, right knee: Principal | ICD-10-CM | POA: Diagnosis present

## 2019-04-03 DIAGNOSIS — Z885 Allergy status to narcotic agent status: Secondary | ICD-10-CM | POA: Diagnosis not present

## 2019-04-03 DIAGNOSIS — Z96651 Presence of right artificial knee joint: Secondary | ICD-10-CM

## 2019-04-03 HISTORY — PX: KNEE ARTHROPLASTY: SHX992

## 2019-04-03 LAB — TYPE AND SCREEN
ABO/RH(D): A NEG
Antibody Screen: NEGATIVE

## 2019-04-03 SURGERY — ARTHROPLASTY, KNEE, TOTAL, USING IMAGELESS COMPUTER-ASSISTED NAVIGATION
Anesthesia: Spinal | Site: Knee | Laterality: Right

## 2019-04-03 MED ORDER — KETOROLAC TROMETHAMINE 30 MG/ML IJ SOLN
INTRAMUSCULAR | Status: DC | PRN
  Administered 2019-04-03: 30 mg via INTRA_ARTICULAR

## 2019-04-03 MED ORDER — HYDROMORPHONE HCL 1 MG/ML IJ SOLN: 0.2500 mg | INTRAMUSCULAR | Status: DC | PRN

## 2019-04-03 MED ORDER — BUPIVACAINE IN DEXTROSE 0.75-8.25 % IT SOLN
INTRATHECAL | Status: DC | PRN
  Administered 2019-04-03: 1.8 mL via INTRATHECAL

## 2019-04-03 MED ORDER — POLYETHYLENE GLYCOL 3350 17 G PO PACK: 17.0000 g | PACK | Freq: Every day | ORAL | Status: DC | PRN

## 2019-04-03 MED ORDER — ONDANSETRON HCL 4 MG/2ML IJ SOLN: 4.0000 mg | Freq: Four times a day (QID) | INTRAMUSCULAR | Status: DC | PRN

## 2019-04-03 MED ORDER — DEXAMETHASONE SODIUM PHOSPHATE 10 MG/ML IJ SOLN
INTRAMUSCULAR | Status: AC
  Filled 2019-04-03: qty 1

## 2019-04-03 MED ORDER — PHENYLEPHRINE HCL (PRESSORS) 10 MG/ML IV SOLN
INTRAVENOUS | Status: AC
  Filled 2019-04-03: qty 1

## 2019-04-03 MED ORDER — LISINOPRIL 20 MG PO TABS
20.0000 mg | ORAL_TABLET | Freq: Every day | ORAL | Status: DC
  Filled 2019-04-03: qty 1

## 2019-04-03 MED ORDER — ONDANSETRON HCL 4 MG/2ML IJ SOLN: 4.0000 mg | Freq: Once | INTRAMUSCULAR | Status: DC | PRN

## 2019-04-03 MED ORDER — DIPHENHYDRAMINE HCL 12.5 MG/5ML PO ELIX: 12.5000 mg | ORAL_SOLUTION | ORAL | Status: DC | PRN

## 2019-04-03 MED ORDER — METOCLOPRAMIDE HCL 5 MG/ML IJ SOLN: 5.0000 mg | Freq: Three times a day (TID) | INTRAMUSCULAR | Status: DC | PRN

## 2019-04-03 MED ORDER — PHENYLEPHRINE 40 MCG/ML (10ML) SYRINGE FOR IV PUSH (FOR BLOOD PRESSURE SUPPORT)
PREFILLED_SYRINGE | INTRAVENOUS | Status: AC
  Filled 2019-04-03: qty 10

## 2019-04-03 MED ORDER — PROPOFOL 1000 MG/100ML IV EMUL
INTRAVENOUS | Status: AC
  Filled 2019-04-03: qty 100

## 2019-04-03 MED ORDER — FENTANYL CITRATE (PF) 100 MCG/2ML IJ SOLN
INTRAMUSCULAR | Status: DC | PRN
  Administered 2019-04-03 (×2): 50 ug via INTRAVENOUS

## 2019-04-03 MED ORDER — ATORVASTATIN CALCIUM 20 MG PO TABS
20.0000 mg | ORAL_TABLET | Freq: Every day | ORAL | Status: DC
  Administered 2019-04-03: 20 mg via ORAL
  Filled 2019-04-03: qty 1

## 2019-04-03 MED ORDER — BUPIVACAINE-EPINEPHRINE 0.25% -1:200000 IJ SOLN
INTRAMUSCULAR | Status: DC | PRN
  Administered 2019-04-03: 30 mL

## 2019-04-03 MED ORDER — BUPIVACAINE-EPINEPHRINE (PF) 0.25% -1:200000 IJ SOLN
INTRAMUSCULAR | Status: AC
  Filled 2019-04-03: qty 30

## 2019-04-03 MED ORDER — SODIUM CHLORIDE (PF) 0.9 % IJ SOLN
INTRAMUSCULAR | Status: AC
  Filled 2019-04-03: qty 50

## 2019-04-03 MED ORDER — PROPOFOL 10 MG/ML IV BOLUS
INTRAVENOUS | Status: DC | PRN
  Administered 2019-04-03 (×2): 20 mg via INTRAVENOUS

## 2019-04-03 MED ORDER — SODIUM CHLORIDE 0.9 % IV SOLN: INTRAVENOUS | Status: DC

## 2019-04-03 MED ORDER — SODIUM CHLORIDE 0.9 % IV BOLUS
1000.0000 mL | Freq: Once | INTRAVENOUS | Status: AC
  Administered 2019-04-03: 1000 mL via INTRAVENOUS

## 2019-04-03 MED ORDER — HYDROCHLOROTHIAZIDE 12.5 MG PO CAPS
12.5000 mg | ORAL_CAPSULE | Freq: Every day | ORAL | Status: DC
  Filled 2019-04-03: qty 1

## 2019-04-03 MED ORDER — ISOPROPYL ALCOHOL 70 % SOLN
Status: DC | PRN
  Administered 2019-04-03: 1 via TOPICAL

## 2019-04-03 MED ORDER — KETOROLAC TROMETHAMINE 30 MG/ML IJ SOLN
INTRAMUSCULAR | Status: AC
  Filled 2019-04-03: qty 1

## 2019-04-03 MED ORDER — MENTHOL 3 MG MT LOZG: 1.0000 | LOZENGE | OROMUCOSAL | Status: DC | PRN

## 2019-04-03 MED ORDER — PHENOL 1.4 % MT LIQD: 1.0000 | OROMUCOSAL | Status: DC | PRN

## 2019-04-03 MED ORDER — MIDAZOLAM HCL 2 MG/2ML IJ SOLN
INTRAMUSCULAR | Status: AC
  Filled 2019-04-03: qty 2

## 2019-04-03 MED ORDER — PHENYLEPHRINE HCL-NACL 10-0.9 MG/250ML-% IV SOLN
INTRAVENOUS | Status: DC | PRN
  Administered 2019-04-03: 40 ug/min via INTRAVENOUS

## 2019-04-03 MED ORDER — METHOCARBAMOL 500 MG PO TABS: 500.0000 mg | ORAL_TABLET | Freq: Four times a day (QID) | ORAL | Status: DC | PRN

## 2019-04-03 MED ORDER — STERILE WATER FOR IRRIGATION IR SOLN
Status: DC | PRN
  Administered 2019-04-03: 2000 mL

## 2019-04-03 MED ORDER — VITAMIN D 25 MCG (1000 UNIT) PO TABS
2000.0000 [IU] | ORAL_TABLET | Freq: Every day | ORAL | Status: DC
  Administered 2019-04-03 – 2019-04-04 (×2): 2000 [IU] via ORAL
  Filled 2019-04-03 (×2): qty 2

## 2019-04-03 MED ORDER — CHLORHEXIDINE GLUCONATE 4 % EX LIQD: 60.0000 mL | Freq: Once | CUTANEOUS | Status: DC

## 2019-04-03 MED ORDER — METOCLOPRAMIDE HCL 5 MG PO TABS: 5.0000 mg | ORAL_TABLET | Freq: Three times a day (TID) | ORAL | Status: DC | PRN

## 2019-04-03 MED ORDER — ASPIRIN 81 MG PO CHEW
81.0000 mg | CHEWABLE_TABLET | Freq: Two times a day (BID) | ORAL | Status: DC
  Administered 2019-04-03 – 2019-04-04 (×2): 81 mg via ORAL
  Filled 2019-04-03 (×2): qty 1

## 2019-04-03 MED ORDER — POVIDONE-IODINE 10 % EX SWAB
2.0000 "application " | Freq: Once | CUTANEOUS | Status: AC
  Administered 2019-04-03: 2 via TOPICAL

## 2019-04-03 MED ORDER — CEFAZOLIN SODIUM-DEXTROSE 2-4 GM/100ML-% IV SOLN
2.0000 g | Freq: Four times a day (QID) | INTRAVENOUS | Status: AC
  Administered 2019-04-03 (×2): 2 g via INTRAVENOUS
  Filled 2019-04-03 (×2): qty 100

## 2019-04-03 MED ORDER — METHOCARBAMOL 500 MG IVPB - SIMPLE MED
500.0000 mg | Freq: Four times a day (QID) | INTRAVENOUS | Status: DC | PRN
  Filled 2019-04-03: qty 50

## 2019-04-03 MED ORDER — PROPOFOL 500 MG/50ML IV EMUL
INTRAVENOUS | Status: DC | PRN
  Administered 2019-04-03: 50 ug/kg/min via INTRAVENOUS

## 2019-04-03 MED ORDER — IRRISEPT - 450ML BOTTLE WITH 0.05% CHG IN STERILE WATER, USP 99.95% OPTIME
TOPICAL | Status: DC | PRN
  Administered 2019-04-03: 450 mL

## 2019-04-03 MED ORDER — TRANEXAMIC ACID-NACL 1000-0.7 MG/100ML-% IV SOLN
1000.0000 mg | INTRAVENOUS | Status: AC
  Administered 2019-04-03: 1000 mg via INTRAVENOUS
  Filled 2019-04-03: qty 100

## 2019-04-03 MED ORDER — CLONIDINE HCL (ANALGESIA) 100 MCG/ML EP SOLN
EPIDURAL | Status: DC | PRN
  Administered 2019-04-03: 50 ug

## 2019-04-03 MED ORDER — ALPRAZOLAM 1 MG PO TABS
1.0000 mg | ORAL_TABLET | Freq: Every day | ORAL | Status: DC
  Administered 2019-04-03: 1 mg via ORAL
  Filled 2019-04-03: qty 1

## 2019-04-03 MED ORDER — ALUM & MAG HYDROXIDE-SIMETH 200-200-20 MG/5ML PO SUSP: 30.0000 mL | ORAL | Status: DC | PRN

## 2019-04-03 MED ORDER — ONDANSETRON HCL 4 MG/2ML IJ SOLN
INTRAMUSCULAR | Status: AC
  Filled 2019-04-03: qty 2

## 2019-04-03 MED ORDER — FENTANYL CITRATE (PF) 100 MCG/2ML IJ SOLN
INTRAMUSCULAR | Status: AC
  Filled 2019-04-03: qty 2

## 2019-04-03 MED ORDER — SENNA 8.6 MG PO TABS
1.0000 | ORAL_TABLET | Freq: Two times a day (BID) | ORAL | Status: DC
  Administered 2019-04-04: 8.6 mg via ORAL
  Filled 2019-04-03 (×2): qty 1

## 2019-04-03 MED ORDER — ISOPROPYL ALCOHOL 70 % SOLN
Status: AC
  Filled 2019-04-03: qty 480

## 2019-04-03 MED ORDER — LORATADINE 10 MG PO TABS
10.0000 mg | ORAL_TABLET | Freq: Every day | ORAL | Status: DC
  Administered 2019-04-03 – 2019-04-04 (×2): 10 mg via ORAL
  Filled 2019-04-03 (×2): qty 1

## 2019-04-03 MED ORDER — ROPIVACAINE HCL 5 MG/ML IJ SOLN
INTRAMUSCULAR | Status: DC | PRN
  Administered 2019-04-03: 25 mL via EPIDURAL

## 2019-04-03 MED ORDER — DEXAMETHASONE SODIUM PHOSPHATE 10 MG/ML IJ SOLN
INTRAMUSCULAR | Status: DC | PRN
  Administered 2019-04-03: 10 mg via INTRAVENOUS

## 2019-04-03 MED ORDER — ACETAMINOPHEN 325 MG PO TABS: 325.0000 mg | ORAL_TABLET | Freq: Four times a day (QID) | ORAL | Status: DC | PRN

## 2019-04-03 MED ORDER — ACETAMINOPHEN 10 MG/ML IV SOLN
1000.0000 mg | INTRAVENOUS | Status: AC
  Administered 2019-04-03: 1000 mg via INTRAVENOUS
  Filled 2019-04-03: qty 100

## 2019-04-03 MED ORDER — PROPOFOL 10 MG/ML IV BOLUS
INTRAVENOUS | Status: AC
  Filled 2019-04-03: qty 20

## 2019-04-03 MED ORDER — DOCUSATE SODIUM 100 MG PO CAPS
100.0000 mg | ORAL_CAPSULE | Freq: Two times a day (BID) | ORAL | Status: DC
  Administered 2019-04-03 – 2019-04-04 (×2): 100 mg via ORAL
  Filled 2019-04-03 (×2): qty 1

## 2019-04-03 MED ORDER — HYDROCODONE-ACETAMINOPHEN 7.5-325 MG PO TABS
1.0000 | ORAL_TABLET | ORAL | Status: DC | PRN
  Administered 2019-04-03 – 2019-04-04 (×3): 1 via ORAL
  Filled 2019-04-03 (×3): qty 1

## 2019-04-03 MED ORDER — SERTRALINE HCL 50 MG PO TABS: 150.0000 mg | ORAL_TABLET | Freq: Every day | ORAL | Status: DC

## 2019-04-03 MED ORDER — POVIDONE-IODINE 10 % EX SWAB: 2.0000 "application " | Freq: Once | CUTANEOUS | Status: DC

## 2019-04-03 MED ORDER — CELECOXIB 200 MG PO CAPS
200.0000 mg | ORAL_CAPSULE | Freq: Two times a day (BID) | ORAL | Status: DC
  Administered 2019-04-03 – 2019-04-04 (×3): 200 mg via ORAL
  Filled 2019-04-03 (×3): qty 1

## 2019-04-03 MED ORDER — MIDAZOLAM HCL 5 MG/5ML IJ SOLN
INTRAMUSCULAR | Status: DC | PRN
  Administered 2019-04-03: 2 mg via INTRAVENOUS

## 2019-04-03 MED ORDER — ONDANSETRON HCL 4 MG PO TABS: 4.0000 mg | ORAL_TABLET | Freq: Four times a day (QID) | ORAL | Status: DC | PRN

## 2019-04-03 MED ORDER — ONDANSETRON HCL 4 MG/2ML IJ SOLN
INTRAMUSCULAR | Status: DC | PRN
  Administered 2019-04-03: 4 mg via INTRAVENOUS

## 2019-04-03 MED ORDER — 0.9 % SODIUM CHLORIDE (POUR BTL) OPTIME
TOPICAL | Status: DC | PRN
  Administered 2019-04-03: 1000 mL

## 2019-04-03 MED ORDER — LISINOPRIL-HYDROCHLOROTHIAZIDE 20-12.5 MG PO TABS: 1.0000 | ORAL_TABLET | Freq: Every day | ORAL | Status: DC

## 2019-04-03 MED ORDER — PHENYLEPHRINE 40 MCG/ML (10ML) SYRINGE FOR IV PUSH (FOR BLOOD PRESSURE SUPPORT)
PREFILLED_SYRINGE | INTRAVENOUS | Status: DC | PRN
  Administered 2019-04-03: 80 ug via INTRAVENOUS

## 2019-04-03 MED ORDER — SODIUM CHLORIDE 0.9 % IR SOLN
Status: DC | PRN
  Administered 2019-04-03: 1000 mL

## 2019-04-03 MED ORDER — LACTATED RINGERS IV SOLN: INTRAVENOUS | Status: DC

## 2019-04-03 MED ORDER — SODIUM CHLORIDE 0.9 % IR SOLN
Status: DC | PRN
  Administered 2019-04-03: 3000 mL

## 2019-04-03 MED ORDER — LIDOCAINE 2% (20 MG/ML) 5 ML SYRINGE
INTRAMUSCULAR | Status: DC | PRN
  Administered 2019-04-03: 40 mg via INTRAVENOUS

## 2019-04-03 MED ORDER — MORPHINE SULFATE (PF) 2 MG/ML IV SOLN: 0.5000 mg | INTRAVENOUS | Status: DC | PRN

## 2019-04-03 MED ORDER — SODIUM CHLORIDE (PF) 0.9 % IJ SOLN
INTRAMUSCULAR | Status: DC | PRN
  Administered 2019-04-03: 30 mL

## 2019-04-03 MED ORDER — DEXAMETHASONE SODIUM PHOSPHATE 10 MG/ML IJ SOLN
10.0000 mg | Freq: Once | INTRAMUSCULAR | Status: AC
  Administered 2019-04-04: 10 mg via INTRAVENOUS
  Filled 2019-04-03: qty 1

## 2019-04-03 MED ORDER — HYDROCODONE-ACETAMINOPHEN 5-325 MG PO TABS
1.0000 | ORAL_TABLET | ORAL | Status: DC | PRN
  Administered 2019-04-03: 1 via ORAL
  Filled 2019-04-03 (×2): qty 1

## 2019-04-03 SURGICAL SUPPLY — 69 items
BAG ZIPLOCK 12X15 (MISCELLANEOUS) IMPLANT
BASEPLATE TIBIAL SZ2 TRI (Joint) IMPLANT
BATTERY INSTRU NAVIGATION (MISCELLANEOUS) ×9 IMPLANT
BLADE SAW RECIPROCATING 77.5 (BLADE) ×3 IMPLANT
BNDG ELASTIC 4X5.8 VLCR STR LF (GAUZE/BANDAGES/DRESSINGS) ×3 IMPLANT
BNDG ELASTIC 6X5.8 VLCR STR LF (GAUZE/BANDAGES/DRESSINGS) ×3 IMPLANT
CHLORAPREP W/TINT 26 (MISCELLANEOUS) ×6 IMPLANT
COMP FEMORAL CMTL TRIATH SZ2 (Joint) ×3 IMPLANT
COMPONENT FEMRL CMTL TRITH SZ2 (Joint) IMPLANT
COVER SURGICAL LIGHT HANDLE (MISCELLANEOUS) ×3 IMPLANT
COVER WAND RF STERILE (DRAPES) IMPLANT
CUFF TOURN SGL QUICK 34 (TOURNIQUET CUFF) ×3
CUFF TRNQT CYL 34X4.125X (TOURNIQUET CUFF) ×1 IMPLANT
DECANTER SPIKE VIAL GLASS SM (MISCELLANEOUS) ×6 IMPLANT
DERMABOND ADVANCED (GAUZE/BANDAGES/DRESSINGS) ×4
DERMABOND ADVANCED .7 DNX12 (GAUZE/BANDAGES/DRESSINGS) ×2 IMPLANT
DRAPE SHEET LG 3/4 BI-LAMINATE (DRAPES) ×9 IMPLANT
DRAPE U-SHAPE 47X51 STRL (DRAPES) ×3 IMPLANT
DRSG AQUACEL AG ADV 3.5X10 (GAUZE/BANDAGES/DRESSINGS) ×3 IMPLANT
DRSG TEGADERM 4X4.75 (GAUZE/BANDAGES/DRESSINGS) IMPLANT
ELECT BLADE TIP CTD 4 INCH (ELECTRODE) ×3 IMPLANT
ELECT REM PT RETURN 15FT ADLT (MISCELLANEOUS) ×3 IMPLANT
EVACUATOR 1/8 PVC DRAIN (DRAIN) IMPLANT
GAUZE SPONGE 4X4 12PLY STRL (GAUZE/BANDAGES/DRESSINGS) ×3 IMPLANT
GLOVE BIO SURGEON STRL SZ8.5 (GLOVE) ×6 IMPLANT
GLOVE BIOGEL PI IND STRL 8.5 (GLOVE) ×1 IMPLANT
GLOVE BIOGEL PI INDICATOR 8.5 (GLOVE) ×2
GOWN SPEC L3 XXLG W/TWL (GOWN DISPOSABLE) ×3 IMPLANT
HANDPIECE INTERPULSE COAX TIP (DISPOSABLE) ×3
HOLDER FOLEY CATH W/STRAP (MISCELLANEOUS) ×3 IMPLANT
HOOD PEEL AWAY FLYTE STAYCOOL (MISCELLANEOUS) ×9 IMPLANT
INSERT TIB BEAR SZ 2 9 KNEE (Miscellaneous) ×2 IMPLANT
JET LAVAGE IRRISEPT WOUND (IRRIGATION / IRRIGATOR) ×3
KIT TURNOVER KIT A (KITS) IMPLANT
KNEE PATELLA ASYMMETRIC 9X29 (Knees) ×2 IMPLANT
LAVAGE JET IRRISEPT WOUND (IRRIGATION / IRRIGATOR) ×1 IMPLANT
MARKER SKIN DUAL TIP RULER LAB (MISCELLANEOUS) ×3 IMPLANT
NDL SAFETY ECLIPSE 18X1.5 (NEEDLE) ×1 IMPLANT
NDL SPNL 18GX3.5 QUINCKE PK (NEEDLE) ×1 IMPLANT
NEEDLE HYPO 18GX1.5 SHARP (NEEDLE) ×3
NEEDLE SPNL 18GX3.5 QUINCKE PK (NEEDLE) ×3 IMPLANT
NS IRRIG 1000ML POUR BTL (IV SOLUTION) ×3 IMPLANT
PACK TOTAL KNEE CUSTOM (KITS) ×3 IMPLANT
PADDING CAST COTTON 6X4 STRL (CAST SUPPLIES) ×3 IMPLANT
PENCIL SMOKE EVACUATOR (MISCELLANEOUS) ×2 IMPLANT
PIN FLUTED HEDLESS FIX 3.5X1/8 (PIN) ×4 IMPLANT
PROTECTOR NERVE ULNAR (MISCELLANEOUS) ×3 IMPLANT
SAW OSC TIP CART 19.5X105X1.3 (SAW) ×3 IMPLANT
SEALER BIPOLAR AQUA 6.0 (INSTRUMENTS) ×3 IMPLANT
SET HNDPC FAN SPRY TIP SCT (DISPOSABLE) ×1 IMPLANT
SET PAD KNEE POSITIONER (MISCELLANEOUS) ×3 IMPLANT
SPONGE DRAIN TRACH 4X4 STRL 2S (GAUZE/BANDAGES/DRESSINGS) IMPLANT
SUT MNCRL AB 3-0 PS2 18 (SUTURE) ×3 IMPLANT
SUT MNCRL AB 4-0 PS2 18 (SUTURE) ×3 IMPLANT
SUT MON AB 2-0 CT1 36 (SUTURE) ×5 IMPLANT
SUT STRATAFIX PDO 1 14 VIOLET (SUTURE) ×3
SUT STRATFX PDO 1 14 VIOLET (SUTURE) ×1
SUT VIC AB 1 CTX 36 (SUTURE) ×6
SUT VIC AB 1 CTX36XBRD ANBCTR (SUTURE) ×2 IMPLANT
SUT VIC AB 2-0 CT1 27 (SUTURE) ×3
SUT VIC AB 2-0 CT1 TAPERPNT 27 (SUTURE) ×1 IMPLANT
SUTURE STRATFX PDO 1 14 VIOLET (SUTURE) ×1 IMPLANT
SYR 3ML LL SCALE MARK (SYRINGE) ×3 IMPLANT
TIBIAL BASEPLATE SZ2 TRI (Joint) ×3 IMPLANT
TOWER CARTRIDGE SMART MIX (DISPOSABLE) IMPLANT
TRAY FOLEY MTR SLVR 14FR STAT (SET/KITS/TRAYS/PACK) ×2 IMPLANT
WATER STERILE IRR 1000ML POUR (IV SOLUTION) ×6 IMPLANT
WRAP KNEE MAXI GEL POST OP (GAUZE/BANDAGES/DRESSINGS) ×3 IMPLANT
YANKAUER SUCT BULB TIP 10FT TU (MISCELLANEOUS) ×3 IMPLANT

## 2019-04-03 NOTE — Interval H&P Note (Signed)
History and Physical Interval Note:  04/03/2019 7:31 AM  Linda Page  has presented today for surgery, with the diagnosis of Degenerative joint disease right knee.  The various methods of treatment have been discussed with the patient and family. After consideration of risks, benefits and other options for treatment, the patient has consented to  Procedure(s): COMPUTER ASSISTED TOTAL KNEE ARTHROPLASTY (Right) as a surgical intervention.  The patient's history has been reviewed, patient examined, no change in status, stable for surgery.  I have reviewed the patient's chart and labs.  Questions were answered to the patient's satisfaction.     Iline Oven Raymona Boss

## 2019-04-03 NOTE — Discharge Instructions (Signed)
° °Dr. Cannan Beeck °Total Joint Specialist °[facility] °[address] °, [place] [address] °[phone] ° °TOTAL KNEE REPLACEMENT POSTOPERATIVE DIRECTIONS ° ° ° °Knee Rehabilitation, Guidelines Following Surgery  °Results after knee surgery are often greatly improved when you follow the exercise, range of motion and muscle strengthening exercises prescribed by your doctor. Safety measures are also important to protect the knee from further injury. Any time any of these exercises cause you to have increased pain or swelling in your knee joint, decrease the amount until you are comfortable again and slowly increase them. If you have problems or questions, call your caregiver or physical therapist for advice.  ° °WEIGHT BEARING °Weight bearing as tolerated with assist device (walker, cane, etc) as directed, use it as long as suggested by your surgeon or therapist, typically at least 4-6 weeks. ° °HOME CARE INSTRUCTIONS  °Remove items at home which could result in a fall. This includes throw rugs or furniture in walking pathways.  °Continue medications as instructed at time of discharge. °You may have some home medications which will be placed on hold until you complete the course of blood thinner medication.  °You may start showering once you are discharged home but do not submerge the incision under water. Just pat the incision dry and apply a dry gauze dressing on daily. °Walk with walker as instructed.  °You may resume a sexual relationship in one month or when given the OK by your doctor.  °· Use walker as long as suggested by your caregivers. °· Avoid periods of inactivity such as sitting longer than an hour when not asleep. This helps prevent blood clots.  °You may put full weight on your legs and walk as much as is comfortable.  °You may return to work once you are cleared by your doctor.  °Do not drive a car for 6 weeks or until released by you surgeon.  °· Do not drive  while taking narcotics.  °Wear the elastic stockings for three weeks following surgery during the day but you may remove then at night. °Make sure you keep all of your appointments after your operation with all of your doctors and caregivers. You should call the office at the above phone number and make an appointment for approximately two weeks after the date of your surgery. °Do not remove your surgical dressing. The dressing is waterproof; you may take showers in 3 days, but do not take tub baths or submerge the dressing. °Please pick up a stool softener and laxative for home use as long as you are requiring pain medications. °· ICE to the affected knee every three hours for 30 minutes at a time and then as needed for pain and swelling.  Continue to use ice on the knee for pain and swelling from surgery. You may notice swelling that will progress down to the foot and ankle.  This is normal after surgery.  Elevate the leg when you are not up walking on it.   °It is important for you to complete the blood thinner medication as prescribed by your doctor. °· Continue to use the breathing machine which will help keep your temperature down.  It is common for your temperature to cycle up and down following surgery, especially at night when you are not up moving around and exerting yourself.  The breathing machine keeps your lungs expanded and your temperature down. ° °RANGE OF MOTION AND STRENGTHENING EXERCISES  °Rehabilitation of the knee is important following   a knee injury or an operation. After just a few days of immobilization, the muscles of the thigh which control the knee become weakened and shrink (atrophy). Knee exercises are designed to build up the tone and strength of the thigh muscles and to improve knee motion. Often times heat used for twenty to thirty minutes before working out will loosen up your tissues and help with improving the range of motion but do not use heat for the first two weeks following  surgery. These exercises can be done on a training (exercise) mat, on the floor, on a table or on a bed. Use what ever works the best and is most comfortable for you Knee exercises include:  °Leg Lifts - While your knee is still immobilized in a splint or cast, you can do straight leg raises. Lift the leg to 60 degrees, hold for 3 sec, and slowly lower the leg. Repeat 10-20 times 2-3 times daily. Perform this exercise against resistance later as your knee gets better.  °Quad and Hamstring Sets - Tighten up the muscle on the front of the thigh (Quad) and hold for 5-10 sec. Repeat this 10-20 times hourly. Hamstring sets are done by pushing the foot backward against an object and holding for 5-10 sec. Repeat as with quad sets.  °A rehabilitation program following serious knee injuries can speed recovery and prevent re-injury in the future due to weakened muscles. Contact your doctor or a physical therapist for more information on knee rehabilitation.  ° °SKILLED REHAB INSTRUCTIONS: °If the patient is transferred to a skilled rehab facility following release from the hospital, a list of the current medications will be sent to the facility for the patient to continue.  When discharged from the skilled rehab facility, please have the facility set up the patient's Home Health Physical Therapy prior to being released. Also, the skilled facility will be responsible for providing the patient with their medications at time of release from the facility to include their pain medication, the muscle relaxants, and their blood thinner medication. If the patient is still at the rehab facility at time of the two week follow up appointment, the skilled rehab facility will also need to assist the patient in arranging follow up appointment in our office and any transportation needs. ° °MAKE SURE YOU:  °Understand these instructions.  °Will watch your condition.  °Will get help right away if you are not doing well or get worse.   ° ° °Pick up stool softner and laxative for home use following surgery while on pain medications. °Do NOT remove your dressing. You may shower.  °Do not take tub baths or submerge incision under water. °May shower starting three days after surgery. °Please use a clean towel to pat the incision dry following showers. °Continue to use ice for pain and swelling after surgery. °Do not use any lotions or creams on the incision until instructed by your surgeon. ° °

## 2019-04-03 NOTE — Anesthesia Procedure Notes (Signed)
Anesthesia Regional Block: Adductor canal block   Pre-Anesthetic Checklist: ,, timeout performed, Correct Patient, Correct Site, Correct Laterality, Correct Procedure, Correct Position, site marked, Risks and benefits discussed,  Surgical consent,  Pre-op evaluation,  At surgeon's request and post-op pain management  Laterality: Right  Prep: chloraprep       Needles:  Injection technique: Single-shot  Needle Type: Echogenic Needle     Needle Length: 9cm  Needle Gauge: 21     Additional Needles:   Procedures:,,,, ultrasound used (permanent image in chart),,,,  Narrative:  Start time: 04/03/2019 7:00 AM End time: 04/03/2019 7:05 AM Injection made incrementally with aspirations every 5 mL.  Performed by: Personally  Anesthesiologist: Marcene Duos, MD

## 2019-04-03 NOTE — Op Note (Signed)
OPERATIVE REPORT  SURGEON: Rod Can, MD   ASSISTANT: Nehemiah Massed, PA-C.  PREOPERATIVE DIAGNOSIS: Right knee arthritis.   POSTOPERATIVE DIAGNOSIS: Right knee arthritis.   PROCEDURE: Right total knee arthroplasty.   IMPLANTS: Stryker Triathlon CR femur, size 2. Stryker Tritanium tibia, size 2. X3 polyethelyene insert, size 9 mm, CR. 3 button asymmetric patella, size 29 mm.  ANESTHESIA:  MAC, Regional and Spinal  TOURNIQUET TIME: Not utilized.   ESTIMATED BLOOD LOSS:-300 mL    ANTIBIOTICS: 3 g Ancef.  DRAINS: None.  COMPLICATIONS: None   CONDITION: PACU - hemodynamically stable.   BRIEF CLINICAL NOTE: Linda Page is a 69 y.o. female with a long-standing history of Right knee arthritis. After failing conservative management, the patient was indicated for total knee arthroplasty. The risks, benefits, and alternatives to the procedure were explained, and the patient elected to proceed.  PROCEDURE IN DETAIL: Adductor canal block was obtained in the pre-op holding area. Once inside the operative room, spinal anesthesia was obtained, and a foley catheter was inserted. The patient was then positioned, a nonsterile tourniquet was placed, and the lower extremity was prepped and draped in the normal sterile surgical fashion.  A time-out was called verifying side and site of surgery. The patient received IV antibiotics within 60 minutes of beginning the procedure. The tourniquet was not utilized.   An anterior approach to the knee was performed utilizing a midvastus arthrotomy. A medial release was performed and the patellar fat pad was excised. Stryker navigation was used to cut the distal femur perpendicular to the mechanical axis. A freehand patellar resection was performed, and the patella was sized an prepared with 3 lug holes.  Nagivation was used to make a neutral proximal  tibia resection, taking 3 mm of bone from the less affected medial side with 3 degrees of slope. The menisci were excised. A spacer block was placed, and the alignment and balance in extension were confirmed.   The distal femur was sized using the 3-degree external rotation guide referencing the posterior femoral cortex. The appropriate 4-in-1 cutting block was pinned into place. Rotation was checked using Whiteside's line, the epicondylar axis, and then confirmed with a spacer block in flexion. The remaining femoral cuts were performed, taking care to protect the MCL.  The tibia was sized and the trial tray was pinned into place. The remaining trail components were inserted. The knee was stable to varus and valgus stress through a full range of motion. The patella tracked centrally, and the PCL was well balanced. The trial components were removed, and the proximal tibial surface was prepared. Final components were impacted into place. The knee was tested for a final time and found to be well balanced.   The wound was copiously irrigated with Irrisept solution and normal saline using pule lavage.  Marcaine solution was injected into the periarticular soft tissue.  The wound was closed in layers using #1 Vicryl and Stratafix for the fascia, 2-0 Vicryl for the subcutaneous fat, 2-0 Monocryl for the deep dermal layer, 3-0 running Monocryl subcuticular Stitch, and 4-0 Monocryl stay sutures at both ends of the wound. Dermabond was applied to the skin.  Once the glue was fully dried, an Aquacell Ag and compressive dressing were applied.  Tthe patient was transported to the recovery room in stable condition.  Sponge, needle, and instrument counts were correct at the end of the case x2.  The patient tolerated the procedure well and there were no known complications.  Please note that  a surgical assistant was a medical necessity for this procedure in order to perform it in a safe and expeditious manner. Surgical  assistant was necessary to retract the ligaments and vital neurovascular structures to prevent injury to them and also necessary for proper positioning of the limb to allow for anatomic placement of the prosthesis.

## 2019-04-03 NOTE — Anesthesia Procedure Notes (Signed)
Spinal  Patient location during procedure: OR Start time: 04/03/2019 7:38 AM End time: 04/03/2019 7:44 AM Staffing Performed: anesthesiologist  Anesthesiologist: Marcene Duos, MD Preanesthetic Checklist Completed: patient identified, IV checked, site marked, risks and benefits discussed, surgical consent, monitors and equipment checked, pre-op evaluation and timeout performed Spinal Block Patient position: sitting Prep: DuraPrep Patient monitoring: heart rate, cardiac monitor, continuous pulse ox and blood pressure Approach: midline Location: L4-5 Injection technique: single-shot Needle Needle type: Pencan  Needle gauge: 24 G Needle length: 9 cm Assessment Sensory level: T4

## 2019-04-03 NOTE — Evaluation (Signed)
Physical Therapy Evaluation Patient Details Name: Linda Page MRN: [id number] DOB: [birth year] Today's Date: 04/03/2019   History of Present Illness  Patient is 69 y.o. 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    Clinical Impression  Linda Page is a 69 y.o. female POD 0 s/p Rt TKA. Patient reports independence with mobility at baseline. Patient is now limited by functional impairments (see PT problem list below) and requires min assist for transfers and gait with RW. Patient was able to ambulate ~25 feet with RW and min assist. Patient instructed in exercise to facilitate ROM and circulation. Patient will benefit from continued skilled PT interventions to address impairments and progress towards PLOF. Acute PT will follow to progress mobility and stair training in preparation for safe discharge home.   Pt's BP monitored during session:  Supine= 98/59  Seated= 96/62 Standing=100/57 Post gait=106/62     Follow Up Recommendations Follow surgeon's recommendation for DC plan and follow-up therapies;Outpatient PT(pt reports going to OPPT)    Equipment Recommendations  Rolling walker with 5" wheels    Recommendations for Other Services       Precautions / Restrictions Precautions Precautions: Fall Restrictions Weight Bearing Restrictions: No      Mobility  Bed Mobility Overal bed mobility: Needs Assistance Bed Mobility: Supine to Sit     Supine to sit: HOB elevated;Min assist     General bed mobility comments: cues for reaching to bed rail and to walk LE's to EOB. light assist to brign LE's completely to EOB and steady trunk with raising.  Transfers Overall transfer level: Needs assistance Equipment used: Rolling walker (2 wheeled) Transfers: Sit to/from Stand Sit to Stand: Min assist         General transfer comment: cues for hand placement/technique, assist required for power up and to  steady with rising.  Ambulation/Gait Ambulation/Gait assistance: Min assist Gait Distance (Feet): 25 Feet Assistive device: Rolling walker (2 wheeled) Gait Pattern/deviations: Step-to pattern;Decreased stance time - right;Decreased step length - left;Decreased stride length;Decreased weight shift to right Gait velocity: decreased   General Gait Details: cues for safe step pattern and proximity to RW throughout, assist to manage walker position. min assist at Rt knee initially to facilitate knee extension in stance, pt able to use UE support on walker to prevent buckling.  Stairs       Wheelchair Mobility    Modified Rankin (Stroke Patients Only)       Balance Overall balance assessment: Needs assistance Sitting-balance support: Feet supported Sitting balance-Leahy Scale: Good     Standing balance support: During functional activity;Bilateral upper extremity supported Standing balance-Leahy Scale: Poor            Pertinent Vitals/Pain Pain Assessment: 0-10 Pain Score: 2  Pain Location: Rt knee Pain Descriptors / Indicators: Aching Pain Intervention(s): Limited activity within patient's tolerance;Monitored during session;Repositioned;Ice applied    Home Living Family/patient expects to be discharged to:: Private residence Living Arrangements: Alone Available Help at Discharge: Family Type of Home: House Home Access: Stairs to enter Entrance Stairs-Rails: None Entrance Stairs-Number of Steps: 2 Home Layout: One level Home Equipment: Grab bars - tub/shower Additional Comments: pt's daughter Victorino Dike is able to stay with her through the weekend, she lives 5 minutes away.    Prior Function Level of Independence: Independent               Hand Dominance   Dominant Hand: Right  Extremity/Trunk Assessment   Upper Extremity Assessment Upper Extremity Assessment: Overall WFL for tasks assessed    Lower Extremity Assessment Lower Extremity Assessment:  RLE deficits/detail RLE Deficits / Details: good quad activation, no extensor lag with SLR, RLE Sensation: WNL RLE Coordination: WNL    Cervical / Trunk Assessment Cervical / Trunk Assessment: Normal  Communication   Communication: No difficulties  Cognition Arousal/Alertness: Awake/alert Behavior During Therapy: WFL for tasks assessed/performed Overall Cognitive Status: Within Functional Limits for tasks assessed                 General Comments      Exercises Total Joint Exercises Ankle Circles/Pumps: AROM;15 reps;Both;Seated Quad Sets: AROM;Right;5 reps;Seated   Assessment/Plan    PT Assessment Patient needs continued PT services  PT Problem List Decreased strength;Decreased range of motion;Decreased activity tolerance;Decreased balance;Decreased mobility;Decreased knowledge of use of DME       PT Treatment Interventions DME instruction;Gait training;Stair training;Functional mobility training;Therapeutic activities;Balance training;Therapeutic exercise;Patient/family education    PT Goals (Current goals can be found in the Care Plan section)  Acute Rehab PT Goals Patient Stated Goal: to return home PT Goal Formulation: With patient Time For Goal Achievement: 04/10/19 Potential to Achieve Goals: Good    Frequency 7X/week    AM-PAC PT "6 Clicks" Mobility  Outcome Measure Help needed turning from your back to your side while in a flat bed without using bedrails?: None Help needed moving from lying on your back to sitting on the side of a flat bed without using bedrails?: A Little Help needed moving to and from a bed to a chair (including a wheelchair)?: A Little Help needed standing up from a chair using your arms (e.g., wheelchair or bedside chair)?: A Little Help needed to walk in hospital room?: A Little Help needed climbing 3-5 steps with a railing? : A Little 6 Click Score: 19    End of Session Equipment Utilized During Treatment: Gait belt Activity  Tolerance: Patient tolerated treatment well Patient left: in chair;with call bell/phone within reach;with chair alarm set;with family/visitor present Nurse Communication: Mobility status PT Visit Diagnosis: Muscle weakness (generalized) (M62.81);Difficulty in walking, not elsewhere classified (R26.2)    Time: 6237-6283 PT Time Calculation (min) (ACUTE ONLY): 21 min   Charges:   PT Evaluation $PT Eval Low Complexity: 1 Low         Verner Mould, DPT Physical Therapist with [facility] [phone]  04/03/2019 5:52 PM

## 2019-04-03 NOTE — Anesthesia Procedure Notes (Signed)
Date/Time: 04/03/2019 7:42 AM Performed by: Jhonnie Garner, CRNA Oxygen Delivery Method: Simple face mask

## 2019-04-03 NOTE — Transfer of Care (Signed)
Immediate Anesthesia Transfer of Care Note  Patient: Linda Page  Procedure(s) Performed: COMPUTER ASSISTED TOTAL KNEE ARTHROPLASTY (Right Knee)  Patient Location: PACU  Anesthesia Type:Spinal  Level of Consciousness: awake, alert  and oriented  Airway & Oxygen Therapy: Patient Spontanous Breathing and Patient connected to face mask oxygen  Post-op Assessment: Report given to RN and Post -op Vital signs reviewed and stable  Post vital signs: Reviewed and stable  Last Vitals:  Vitals Value Taken Time  BP    Temp    Pulse    Resp    SpO2      Last Pain:  Vitals:   04/03/19 0547  TempSrc:   PainSc: 0-No pain         Complications: No apparent anesthesia complications

## 2019-04-04 ENCOUNTER — Encounter: Payer: Self-pay | Admitting: *Deleted

## 2019-04-04 ENCOUNTER — Other Ambulatory Visit: Payer: Self-pay

## 2019-04-04 DIAGNOSIS — M1711 Unilateral primary osteoarthritis, right knee: Secondary | ICD-10-CM | POA: Diagnosis not present

## 2019-04-04 LAB — BASIC METABOLIC PANEL
Anion gap: 7 (ref 5–15)
BUN: 31 mg/dL — ABNORMAL HIGH (ref 8–23)
CO2: 24 mmol/L (ref 22–32)
Calcium: 8.2 mg/dL — ABNORMAL LOW (ref 8.9–10.3)
Chloride: 108 mmol/L (ref 98–111)
Creatinine, Ser: 0.89 mg/dL (ref 0.44–1.00)
GFR calc Af Amer: >60 mL/min (ref >60)
GFR calc non Af Amer: >60 mL/min (ref >60)
Glucose, Bld: 155 mg/dL — ABNORMAL HIGH (ref 70–99)
Potassium: 4.1 mmol/L (ref 3.5–5.1)
Sodium: 139 mmol/L (ref 135–145)

## 2019-04-04 LAB — CBC
HCT: 28.3 % — ABNORMAL LOW (ref 36.0–46.0)
Hemoglobin: 9 g/dL — ABNORMAL LOW (ref 12.0–15.0)
MCH: 30.4 pg (ref 26.0–34.0)
MCHC: 31.8 g/dL (ref 30.0–36.0)
MCV: 95.6 fL (ref 80.0–100.0)
Platelets: 245 10*3/uL (ref 150–400)
RBC: 2.96 MIL/uL — ABNORMAL LOW (ref 3.87–5.11)
RDW: 12.6 % (ref 11.5–15.5)
WBC: 15.7 10*3/uL — ABNORMAL HIGH (ref 4.0–10.5)
nRBC: 0 % (ref 0.0–0.2)

## 2019-04-04 MED ORDER — SENNA 8.6 MG PO TABS: 2.0000 | ORAL_TABLET | Freq: Every day | ORAL | 0 refills | Status: DC

## 2019-04-04 MED ORDER — ONDANSETRON HCL 4 MG PO TABS: 4.0000 mg | ORAL_TABLET | Freq: Four times a day (QID) | ORAL | 0 refills | Status: DC | PRN

## 2019-04-04 MED ORDER — HYDROCODONE-ACETAMINOPHEN 5-325 MG PO TABS: 1.0000 | ORAL_TABLET | ORAL | 0 refills | Status: DC | PRN

## 2019-04-04 MED ORDER — DOCUSATE SODIUM 100 MG PO CAPS: 100.0000 mg | ORAL_CAPSULE | Freq: Two times a day (BID) | ORAL | 0 refills | Status: DC

## 2019-04-04 MED ORDER — ASPIRIN 81 MG PO CHEW: 81.0000 mg | CHEWABLE_TABLET | Freq: Two times a day (BID) | ORAL | 0 refills | Status: AC

## 2019-04-04 MED ORDER — CELECOXIB 200 MG PO CAPS: 200.0000 mg | ORAL_CAPSULE | Freq: Every day | ORAL | 2 refills | Status: DC

## 2019-04-04 MED ORDER — METHOCARBAMOL 500 MG PO TABS: 500.0000 mg | ORAL_TABLET | Freq: Four times a day (QID) | ORAL | 0 refills | Status: DC | PRN

## 2019-04-04 NOTE — Progress Notes (Signed)
Physical Therapy Treatment Patient Details Name: Linda Page MRN: [id number] DOB: Feb 29, 1950 Today's Date: 04/04/2019    History of Present Illness Patient is 69 y.o. 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    PT Comments    Pt progressing well with mobility and eager for dc home.  Pt reviewed stairs and HEP with written instruction provided.   Follow Up Recommendations  Follow surgeon's recommendation for DC plan and follow-up therapies;Outpatient PT     Equipment Recommendations  Rolling walker with 5" wheels    Recommendations for Other Services       Precautions / Restrictions Precautions Precautions: Fall Restrictions Weight Bearing Restrictions: No    Mobility  Bed Mobility Overal bed mobility: Needs Assistance Bed Mobility: Supine to Sit     Supine to sit: Min guard     General bed mobility comments: Pt up in chair and requests back to same  Transfers Overall transfer level: Needs assistance Equipment used: Rolling walker (2 wheeled) Transfers: Sit to/from Stand Sit to Stand: Supervision         General transfer comment: cues for LE management and use of UEs to self assist  Ambulation/Gait Ambulation/Gait assistance: Min guard;Supervision Gait Distance (Feet): 80 Feet Assistive device: Rolling walker (2 wheeled) Gait Pattern/deviations: Step-to pattern;Decreased stance time - right;Decreased step length - left;Decreased stride length;Decreased weight shift to right;Antalgic;Trunk flexed Gait velocity: decreased   General Gait Details: cues for sequence, posture, position from RW and increased R knee ext   Stairs Stairs: Yes Stairs assistance: Min assist Stair Management: No rails;Step to pattern;Forwards;With walker Number of Stairs: 2 General stair comments: single step twice fwd with cues for sequence and foot/RW placement   Wheelchair Mobility    Modified Rankin  (Stroke Patients Only)       Balance Overall balance assessment: Needs assistance Sitting-balance support: Feet supported Sitting balance-Leahy Scale: Good     Standing balance support: During functional activity;Bilateral upper extremity supported Standing balance-Leahy Scale: Poor                              Cognition Arousal/Alertness: Awake/alert Behavior During Therapy: WFL for tasks assessed/performed Overall Cognitive Status: Within Functional Limits for tasks assessed                                        Exercises Total Joint Exercises Ankle Circles/Pumps: AROM;15 reps;Both;Supine Quad Sets: AROM;Right;10 reps;Supine Heel Slides: AAROM;Right;15 reps;Supine Straight Leg Raises: AAROM;AROM;Right;10 reps;Supine Long Arc Quad: AROM;Right;10 reps;Seated    General Comments        Pertinent Vitals/Pain Pain Assessment: 0-10 Pain Score: 4  Pain Location: Rt knee Pain Descriptors / Indicators: Aching Pain Intervention(s): Limited activity within patient's tolerance;Monitored during session;Premedicated before session;Ice applied    Home Living                      Prior Function            PT Goals (current goals can now be found in the care plan section) Acute Rehab PT Goals Patient Stated Goal: to return home PT Goal Formulation: With patient Time For Goal Achievement: 04/10/19 Potential to Achieve Goals: Good Progress towards PT goals: Progressing toward goals    Frequency    7X/week  PT Plan Current plan remains appropriate    Co-evaluation              AM-PAC PT "6 Clicks" Mobility   Outcome Measure  Help needed turning from your back to your side while in a flat bed without using bedrails?: None Help needed moving from lying on your back to sitting on the side of a flat bed without using bedrails?: A Little Help needed moving to and from a bed to a chair (including a wheelchair)?: A  Little Help needed standing up from a chair using your arms (e.g., wheelchair or bedside chair)?: A Little Help needed to walk in hospital room?: A Little Help needed climbing 3-5 steps with a railing? : A Little 6 Click Score: 19    End of Session Equipment Utilized During Treatment: Gait belt Activity Tolerance: Patient tolerated treatment well Patient left: in chair;with call bell/phone within reach;with chair alarm set Nurse Communication: Mobility status PT Visit Diagnosis: Muscle weakness (generalized) (M62.81);Difficulty in walking, not elsewhere classified (R26.2)     Time: 1131-1203 PT Time Calculation (min) (ACUTE ONLY): 32 min  Charges:  $Gait Training: 8-22 mins $Therapeutic Exercise: 8-22 mins                     [place] [place] Pager [phone] Office [phone]    Marchelle Rinella 04/04/2019, 1:01 PM

## 2019-04-04 NOTE — Anesthesia Postprocedure Evaluation (Signed)
Anesthesia Post Note  Patient: Linda Page  Procedure(s) Performed: COMPUTER ASSISTED TOTAL KNEE ARTHROPLASTY (Right Knee)     Patient location during evaluation: PACU Anesthesia Type: Spinal Level of consciousness: awake and alert Pain management: pain level controlled Vital Signs Assessment: post-procedure vital signs reviewed and stable Respiratory status: spontaneous breathing and respiratory function stable Cardiovascular status: blood pressure returned to baseline and stable Postop Assessment: spinal receding Anesthetic complications: no    Last Vitals:  Vitals:   04/04/19 0626 04/04/19 0629  BP: (!) 86/48 96/65  Pulse: 79 79  Resp: 18   Temp: 36.8 C   SpO2: 97%     Last Pain:  Vitals:   04/04/19 0815  TempSrc:   PainSc: 7                  Kennieth Rad

## 2019-04-04 NOTE — Progress Notes (Signed)
    Subjective:  Patient reports pain as mild to moderate.  Denies N/V/CP/SOB. No c/o  Objective:   VITALS:   Vitals:   04/04/19 0226 04/04/19 0521 04/04/19 0626 04/04/19 0629  BP: (!) 106/57 (!) 101/54 (!) 86/48 96/65  Pulse: 92 88 79 79  Resp: 18 18 18    Temp: 98 F (36.7 C) 97.7 F (36.5 C) 98.2 F (36.8 C)   TempSrc: Oral Oral Oral   SpO2: 93% 95% 97%   Weight:        NAD ABD soft Sensation intact distally Intact pulses distally Dorsiflexion/Plantar flexion intact Incision: dressing C/D/I Compartment soft   Lab Results  Component Value Date   WBC 15.7 (H) 04/04/2019   HGB 9.0 (L) 04/04/2019   HCT 28.3 (L) 04/04/2019   MCV 95.6 04/04/2019   PLT 245 04/04/2019   BMET    Component Value Date/Time   NA 139 04/04/2019 0318   K 4.1 04/04/2019 0318   CL 108 04/04/2019 0318   CO2 24 04/04/2019 0318   GLUCOSE 155 (H) 04/04/2019 0318   BUN 31 (H) 04/04/2019 0318   CREATININE 0.89 04/04/2019 0318   CALCIUM 8.2 (L) 04/04/2019 0318   GFRNONAA >60 04/04/2019 0318   GFRAA >60 04/04/2019 0318     Assessment/Plan: 1 Day Post-Op   Principal Problem:   Osteoarthritis of right knee   WBAT with walker DVT ppx: Aspirin, SCDs, TEDS PO pain control PT/OT Dispo: D/C foley, D/C home with OPPT after clears PT    04/06/2019 Hodge Stachnik 04/04/2019, 8:39 AM   04/06/2019, MD [phone] [facility] is now [facility]  [place] [address], [address], [place], [place] Phone: [phone] [email or web address] Facebook  [phone]

## 2019-04-04 NOTE — Progress Notes (Signed)
Physical Therapy Treatment Patient Details Name: Linda Page MRN: [id number] DOB: [birth year] Today's Date: 04/04/2019    History of Present Illness Patient is 69 y.o. 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    PT Comments    Pt motivated and progressing well with mobility.   Follow Up Recommendations  Follow surgeon's recommendation for DC plan and follow-up therapies;Outpatient PT     Equipment Recommendations  Rolling walker with 5" wheels    Recommendations for Other Services       Precautions / Restrictions Precautions Precautions: Fall Restrictions Weight Bearing Restrictions: No    Mobility  Bed Mobility Overal bed mobility: Needs Assistance Bed Mobility: Supine to Sit     Supine to sit: Min guard     General bed mobility comments: cues for sequence and use of L LE to self assist  Transfers Overall transfer level: Needs assistance Equipment used: Rolling walker (2 wheeled) Transfers: Sit to/from Stand Sit to Stand: Min guard         General transfer comment: cues for LE management and use of UEs to self assist  Ambulation/Gait Ambulation/Gait assistance: Min assist Gait Distance (Feet): 75 Feet Assistive device: Rolling walker (2 wheeled) Gait Pattern/deviations: Step-to pattern;Decreased stance time - right;Decreased step length - left;Decreased stride length;Decreased weight shift to right;Antalgic;Trunk flexed Gait velocity: decreased   General Gait Details: cues for sequence, posture, position from RW and increased R knee ext   Stairs             Wheelchair Mobility    Modified Rankin (Stroke Patients Only)       Balance Overall balance assessment: Needs assistance Sitting-balance support: Feet supported Sitting balance-Leahy Scale: Good     Standing balance support: During functional activity;Bilateral upper extremity supported Standing balance-Leahy  Scale: Poor                              Cognition Arousal/Alertness: Awake/alert Behavior During Therapy: WFL for tasks assessed/performed Overall Cognitive Status: Within Functional Limits for tasks assessed                                        Exercises Total Joint Exercises Ankle Circles/Pumps: AROM;15 reps;Both;Supine Quad Sets: AROM;Right;10 reps;Supine Heel Slides: AAROM;Right;15 reps;Supine Straight Leg Raises: AAROM;AROM;Right;10 reps;Supine    General Comments        Pertinent Vitals/Pain Pain Assessment: 0-10 Pain Score: 4  Pain Location: Rt knee Pain Descriptors / Indicators: Aching Pain Intervention(s): Limited activity within patient's tolerance;Monitored during session;Premedicated before session;Ice applied    Home Living                      Prior Function            PT Goals (current goals can now be found in the care plan section) Acute Rehab PT Goals Patient Stated Goal: to return home PT Goal Formulation: With patient Time For Goal Achievement: 04/10/19 Potential to Achieve Goals: Good Progress towards PT goals: Progressing toward goals    Frequency    7X/week      PT Plan Current plan remains appropriate    Co-evaluation              AM-PAC PT "6 Clicks" Mobility   Outcome Measure  Help needed turning from your back to your side while in a flat bed without using bedrails?: None Help needed moving from lying on your back to sitting on the side of a flat bed without using bedrails?: A Little Help needed moving to and from a bed to a chair (including a wheelchair)?: A Little Help needed standing up from a chair using your arms (e.g., wheelchair or bedside chair)?: A Little Help needed to walk in hospital room?: A Little Help needed climbing 3-5 steps with a railing? : A Little 6 Click Score: 19    End of Session Equipment Utilized During Treatment: Gait belt Activity Tolerance: Patient  tolerated treatment well Patient left: in chair;with call bell/phone within reach;with chair alarm set Nurse Communication: Mobility status PT Visit Diagnosis: Muscle weakness (generalized) (M62.81);Difficulty in walking, not elsewhere classified (R26.2)     Time: 9470-9628 PT Time Calculation (min) (ACUTE ONLY): 30 min  Charges:  $Gait Training: 8-22 mins $Therapeutic Exercise: 8-22 mins                     Ramona Pager [phone] Office [phone]    Draken Farrior 04/04/2019, 12:58 PM

## 2019-04-04 NOTE — Discharge Summary (Signed)
Physician Discharge Summary  Patient ID: Linda Page MRN: [id number] DOB/AGE: 1950-01-12 69 y.o.  Admit date: 04/03/2019 Discharge date: 04/04/2019  Admission Diagnoses:  Osteoarthritis of right knee  Discharge Diagnoses:  Principal Problem:   Osteoarthritis of right knee   Past Medical History:  Diagnosis Date  . Anxiety   . Arthritis    in neck, in knee has gel shots givin in knee once a week for 3 weeks  . Depression   . Hypertension     Surgeries: Procedure(s): COMPUTER ASSISTED TOTAL KNEE ARTHROPLASTY on 04/03/2019   Consultants (if any):   Discharged Condition: Improved  Hospital Course: Linda Page is an 69 y.o. female who was admitted 04/03/2019 with a diagnosis of Osteoarthritis of right knee and went to the operating room on 04/03/2019 and underwent the above named procedures.    She was given perioperative antibiotics:  Anti-infectives (From admission, onward)   Start     Dose/Rate Route Frequency Ordered Stop   04/03/19 1400  ceFAZolin (ANCEF) IVPB 2g/100 mL premix     2 g 200 mL/hr over 30 Minutes Intravenous Every 6 hours 04/03/19 1137 04/03/19 2022   04/03/19 0600  ceFAZolin (ANCEF) 3 g in dextrose 5 % 50 mL IVPB     3 g 100 mL/hr over 30 Minutes Intravenous On call to O.R. 04/02/19 5681 04/03/19 0752    .  She was given sequential compression devices, early ambulation, and ASA for DVT prophylaxis.  She benefited maximally from the hospital stay and there were no complications.    Recent vital signs:  Vitals:   04/04/19 0626 04/04/19 0629  BP: (!) 86/48 96/65  Pulse: 79 79  Resp: 18   Temp: 98.2 F (36.8 C)   SpO2: 97%     Recent laboratory studies:  Lab Results  Component Value Date   HGB 9.0 (L) 04/04/2019   HGB 12.0 03/27/2019   HGB 11.8 (L) 04/20/2015   Lab Results  Component Value Date   WBC 15.7 (H) 04/04/2019   PLT 245 04/04/2019   Lab Results  Component Value Date   INR 1.0 03/27/2019   Lab Results  Component  Value Date   NA 139 04/04/2019   K 4.1 04/04/2019   CL 108 04/04/2019   CO2 24 04/04/2019   BUN 31 (H) 04/04/2019   CREATININE 0.89 04/04/2019   GLUCOSE 155 (H) 04/04/2019    Discharge Medications:   Allergies as of 04/04/2019      Reactions   Codeine Nausea And Vomiting   Other Itching   The patient stated that she was given cortisone shots in her neck whatever was used to numb the area caused her to itch.      Medication List    STOP taking these medications   diclofenac Sodium 1 % Gel Commonly known as: VOLTAREN     TAKE these medications   Allergy 10 MG tablet Generic drug: loratadine Take 20 mg by mouth daily.   ALPRAZolam 1 MG tablet Commonly known as: XANAX Take 1 mg by mouth at bedtime.   aspirin 81 MG chewable tablet Chew 1 tablet (81 mg total) by mouth 2 (two) times daily.   atorvastatin 20 MG tablet Commonly known as: LIPITOR Take 20 mg by mouth at bedtime.   celecoxib 200 MG capsule Commonly known as: CELEBREX Take 1 capsule (200 mg total) by mouth daily.   docusate sodium 100 MG capsule Commonly known as: COLACE Take 1 capsule (100 mg total) by  mouth 2 (two) times daily.   GLUCOSAMINE 1500 COMPLEX PO Take 1 tablet by mouth daily.   HYDROcodone-acetaminophen 5-325 MG tablet Commonly known as: NORCO/VICODIN Take 1 tablet by mouth every 4 (four) hours as needed for moderate pain (pain score 4-6).   lisinopril-hydrochlorothiazide 20-12.5 MG tablet Commonly known as: ZESTORETIC Take 1 tablet by mouth at bedtime.   methocarbamol 500 MG tablet Commonly known as: ROBAXIN Take 1 tablet (500 mg total) by mouth every 6 (six) hours as needed for muscle spasms.   ondansetron 4 MG tablet Commonly known as: ZOFRAN Take 1 tablet (4 mg total) by mouth every 6 (six) hours as needed for nausea.   senna 8.6 MG Tabs tablet Commonly known as: SENOKOT Take 2 tablets (17.2 mg total) by mouth at bedtime.   sertraline 100 MG tablet Commonly known as:  ZOLOFT Take 150 mg by mouth at bedtime.   Vitamin D 50 MCG (2000 UT) Caps Take 2,000 Units by mouth daily.       Diagnostic Studies: DG Knee Right Port  Result Date: 04/03/2019 CLINICAL DATA:  Status post total knee replacement EXAM: PORTABLE RIGHT KNEE - 1-2 VIEW COMPARISON:  None. FINDINGS: Frontal and lateral views obtained. Status post total knee replacement with femoral, tibial, and posterior patellar prosthetic components well-seated. No fracture or dislocation. Fluid and air within the joint are expected postoperative findings. IMPRESSION: Postoperative changes. Prosthetic components well-seated at all sites. No acute fracture or dislocation evident. Electronically Signed   By: Bretta Bang III M.D.   On: 04/03/2019 10:52    Disposition: Discharge disposition: 01-Home or Self Care       Discharge Instructions    Call MD / Call 911   Complete by: As directed    If you experience chest pain or shortness of breath, CALL 911 and be transported to the hospital emergency room.  If you develope a fever above 101 F, pus (white drainage) or increased drainage or redness at the wound, or calf pain, call your surgeon's office.   Constipation Prevention   Complete by: As directed    Drink plenty of fluids.  Prune juice may be helpful.  You may use a stool softener, such as Colace (over the counter) 100 mg twice a day.  Use MiraLax (over the counter) for constipation as needed.   Diet - low sodium heart healthy   Complete by: As directed    Do not put a pillow under the knee. Place it under the heel.   Complete by: As directed    Driving restrictions   Complete by: As directed    No driving for 6 weeks   Increase activity slowly as tolerated   Complete by: As directed    Lifting restrictions   Complete by: As directed    No lifting for 6 weeks   TED hose   Complete by: As directed    Use stockings (TED hose) for 2 weeks on both leg(s).  You may remove them at night for  sleeping.      Follow-up Information    Layna Roeper, Arlys John, MD. Schedule an appointment as soon as possible for a visit in 2 weeks.   Specialty: Orthopedic Surgery Why: For wound re-check Contact information: [address] [place] [address] [phone]            Signed: Iline Oven Shion Bluestein 04/04/2019, 8:41 AM

## 2019-04-04 NOTE — Plan of Care (Signed)
All discharge teaching is done. Questions were answered. 

## 2019-04-07 ENCOUNTER — Other Ambulatory Visit: Payer: Self-pay

## 2019-04-07 ENCOUNTER — Ambulatory Visit (HOSPITAL_COMMUNITY): Payer: BC Managed Care – PPO | Attending: Orthopedic Surgery | Admitting: Physical Therapy

## 2019-04-07 ENCOUNTER — Encounter (HOSPITAL_COMMUNITY): Payer: Self-pay | Admitting: Physical Therapy

## 2019-04-07 DIAGNOSIS — M25561 Pain in right knee: Secondary | ICD-10-CM | POA: Insufficient documentation

## 2019-04-07 DIAGNOSIS — R262 Difficulty in walking, not elsewhere classified: Secondary | ICD-10-CM

## 2019-04-07 DIAGNOSIS — R2689 Other abnormalities of gait and mobility: Secondary | ICD-10-CM | POA: Diagnosis present

## 2019-04-07 NOTE — Therapy (Signed)
[place], [place], [address] Phone: [phone]   Fax:  [phone]  Physical Therapy Evaluation  Patient Details  Name: Linda Page MRN: [id number] Date of Birth: Nov 03, 1950 Referring Provider (PT): Rod Can MD    Encounter Date: 04/07/2019  PT End of Session - 04/07/19 1337    Visit Number  1    Number of Visits  18    Date for PT Re-Evaluation  05/23/19    Authorization Type  [employer], no visit limit, no auth req    Authorization Time Period  04/07/19- 05/23/19    Progress Note Due on Visit  --   05/02/19   PT Start Time  0945    PT Stop Time  1030    PT Time Calculation (min)  45 min    Activity Tolerance  Patient tolerated treatment well    Behavior During Therapy  [facility] for tasks assessed/performed       Past Medical History:  Diagnosis Date  . Anxiety   . Arthritis    in neck, in knee has gel shots givin in knee once a week for 3 weeks  . Depression   . Hypertension     Past Surgical History:  Procedure Laterality Date  . ANTERIOR CERVICAL DECOMP/DISCECTOMY FUSION N/A 04/29/2015   Procedure: ACDF C4-7;  Surgeon: Melina Schools, MD;  Location: [facility];  Service: Orthopedics;  Laterality: N/A;  . cataract surgery     bilateral  . COLONOSCOPY WITH PROPOFOL N/A 11/05/2018   Procedure: COLONOSCOPY WITH PROPOFOL;  Surgeon: Danie Binder, MD;  [facility];  Service: Endoscopy;  Laterality: N/A;  10:30am  . HAND SURGERY     rt  . KNEE ARTHROPLASTY Right 04/03/2019   Procedure: COMPUTER ASSISTED TOTAL KNEE ARTHROPLASTY;  Surgeon: Rod Can, MD;  Location: [facility];  Service: Orthopedics;  Laterality: Right;  . KNEE SURGERY     rt    There were no vitals filed for this visit.   Subjective Assessment - 04/07/19 0956    Subjective  Patient presents to physical therapy s/p RT TKA on 04/03/19. Patient says pain has been moderate, but says she is doing alright overall.  Says most trouble is in the mornings due to stiffness until she gets it moving. Is currently ambulating with RW, managing current pain with prescribed pain medication. Also has vaso ice machine at home which she has been using also.    Limitations  Sitting;Lifting;Standing;Walking;House hold activities    How long can you stand comfortably?  5-10 min    How long can you walk comfortably?  5-10 min    Patient Stated Goals  Get to point where I am healing again    Currently in Pain?  Yes    Pain Score  4     Pain Location  Knee    Pain Orientation  Right;Anterior    Pain Descriptors / Indicators  Aching;Dull    Pain Type  Surgical pain    Pain Onset  In the past 7 days    Pain Frequency  Constant    Aggravating Factors   standing, walking, bending    Pain Relieving Factors  rest, ice, meds    Effect of Pain on Daily Activities  Limits         [facility] PT Assessment - 04/07/19 0001      Assessment   Medical Diagnosis  RT TKA  Referring Provider (PT)  Samson Frederic MD     Onset Date/Surgical Date  04/03/19    Next MD Visit  04/21/19    Prior Therapy  Yes       Restrictions   Weight Bearing Restrictions  No      Balance Screen   Has the patient fallen in the past 6 months  No      Home Environment   Living Environment  Private residence    Living Arrangements  Alone    Type of Home  House    Home Access  Level entry      Prior Function   Level of Independence  Independent      Cognition   Overall Cognitive Status  Within Functional Limits for tasks assessed      Observation/Other Assessments   Observations  Post op bandage intact, no visible drainage    Focus on Therapeutic Outcomes (FOTO)   48% limited       Observation/Other Assessments-Edema    Edema  --   Mod edema noted about RT knee joint line      Sensation   Light Touch  Appears Intact      AROM   AROM Assessment Site  Knee    Right/Left Knee  Right;Left    Right Knee Extension  10    Right Knee  Flexion  75      Strength   Strength Assessment Site  Hip;Knee;Ankle    Right/Left Hip  Right;Left    Right Hip Flexion  3-/5    Right Hip Extension  4-/5   tested in SL position   Right Hip ABduction  3+/5    Left Hip Flexion  5/5    Left Hip Extension  4+/5    Left Hip ABduction  4+/5    Right/Left Knee  Right;Left    Right Knee Flexion  4/5    Right Knee Extension  3-/5    Left Knee Flexion  5/5    Left Knee Extension  5/5    Right/Left Ankle  Right;Left    Right Ankle Dorsiflexion  4+/5    Left Ankle Dorsiflexion  5/5      Palpation   Palpation comment  Mod tenderness to palpation noted diffuse aboutRT knee joint and incision       Ambulation/Gait   Ambulation/Gait  Yes    Ambulation/Gait Assistance  6: Modified independent (Device/Increase time)    Assistive device  Rolling walker    Gait Pattern  Step-to pattern;Decreased stance time - right;Decreased step length - right;Decreased stride length;Decreased hip/knee flexion - right;Decreased dorsiflexion - right;Antalgic;Trunk flexed    Ambulation Surface  Level;Indoor      Balance   Balance Assessed  Yes      Static Standing Balance   Static Standing Balance -  Activities   Single Leg Stance - Right Leg;Single Leg Stance - Left Leg    Static Standing - Comment/# of Minutes  currently unable                Objective measurements completed on examination: See above findings.      [facility] Adult PT Treatment/Exercise - 04/07/19 0001      Exercises   Exercises  Knee/Hip      Knee/Hip Exercises: Supine   Quad Sets  Right;5 reps    Quad Sets Limitations  5 sec hold    Heel Slides  Right;5 reps    Heel Slides Limitations  5 sec  hold    Heel Prop for Knee Extension  1 minute    Other Supine Knee/Hip Exercises  glute set 5 x 5 sec hold              PT Education - 04/07/19 0959    Education Details  on evaluation findings, POC and HEP    Person(s) Educated  Patient    Methods  Explanation;Handout     Comprehension  Verbalized understanding       PT Short Term Goals - 04/07/19 1344      PT SHORT TERM GOAL #1   Title  Patient will be independent with initial HEP to improve functional outcomes    Time  3    Period  Weeks    Status  New    Target Date  05/02/19      PT SHORT TERM GOAL #2   Title  Patient will have RT knee AROM 0-100 degrees to improve functional mobility and facilitate normal ambulation.    Time  3    Period  Weeks    Status  New    Target Date  05/02/19        PT Long Term Goals - 04/07/19 1345      PT LONG TERM GOAL #1   Title  Patient will have RT knee AROM 0-120 degrees to improve functional mobility and facilitate squatting to pick up items from floor.    Time  6    Period  Weeks    Status  New    Target Date  05/23/19      PT LONG TERM GOAL #2   Title  Patient will improve FOTO score to <30% to indicate improvement in functional outcomes    Time  6    Period  Weeks    Status  New    Target Date  05/23/19      PT LONG TERM GOAL #3   Title  Patient will have equal to or > 4+/5 MMT throughout RLE to improve ability to perform functional mobility, stair ambulation and ADLs.    Time  6    Period  Weeks    Status  New    Target Date  05/23/19      PT LONG TERM GOAL #4   Title  Patient will be able to ambulate at least 350 feet during with LRAD to demonstrate improved ability to perform functional mobility and associated tasks.    Time  6    Period  Weeks    Status  New    Target Date  05/23/19             Plan - 04/07/19 1340    Clinical Impression Statement  Patient is a 69 y.o. female who presents to physical therapy with complaint of RT knee pain s/p RT TKA on 04/03/19. Patient demonstrates decreased strength, ROM restriction, balance deficits and gait abnormalities which are likely contributing to symptoms of pain and are negatively impacting patient ability to perform ADLs and functional mobility tasks. Patient will benefit  from skilled physical therapy services to address these deficits to reduce pain, improve level of function with ADLs, functional mobility tasks, and reduce risk for falls.    Examination-Activity Limitations  Bathing;Lift;Stand;Bed Mobility;Bend;Transfers;Toileting;Locomotion Level;Carry;Sleep;Dressing;Squat;Hygiene/Grooming;Stairs    Examination-Participation Restrictions  Community Activity;Yard Work    Stability/Clinical Decision Making  Stable/Uncomplicated    Clinical Decision Making  Low    Rehab Potential  Good    PT Frequency  3x / week    PT Duration  6 weeks    PT Treatment/Interventions  ADLs/Self Care Home Management;Aquatic Therapy;Biofeedback;Cryotherapy;Electrical Stimulation;Therapeutic exercise;Therapeutic activities;Ultrasound;Functional mobility training;Moist Heat;Stair training;Iontophoresis 4mg /ml Dexamethasone;DME Instruction;Gait training;Balance training;Neuromuscular re-education;Manual techniques;Patient/family education;Orthotic Fit/Training;Compression bandaging;Vasopneumatic Device;Taping;Joint Manipulations;Splinting;Energy conservation;Spinal Manipulations;Dry needling;Scar mobilization;Passive range of motion    PT Next Visit Plan  Review goals, and HEP. Progress RT knee AROM, and strength as tolerated, with focus on full extension, and gait with LRAD. Progress to standing as tolerated. Use manuals PRN    PT Home Exercise Plan  04/07/19: quad set, heel slide with rope, glute set, heel prop    Consulted and Agree with Plan of Care  Patient       Patient will benefit from skilled therapeutic intervention in order to improve the following deficits and impairments:  Abnormal gait, Pain, Decreased mobility, Decreased scar mobility, Decreased activity tolerance, Decreased endurance, Decreased range of motion, Decreased strength, Hypomobility, Decreased balance, Difficulty walking, Increased edema, Impaired flexibility  Visit Diagnosis: Right knee pain, unspecified  chronicity  Other abnormalities of gait and mobility  Difficulty in walking, not elsewhere classified     Problem List Patient Active Problem List   Diagnosis Date Noted  . Osteoarthritis of right knee 04/03/2019  . Preventative health care 08/12/2018  . Neck pain 04/29/2015  . Degeneration of cervical intervertebral disc 09/24/2013  . Pain in joint, shoulder region 09/24/2013  . Muscle weakness (generalized) 09/24/2013  . Decreased range of motion of neck 09/24/2013    1:57 PM, 04/07/19 04/09/19 PT DPT  Physical Therapist with [facility]  [facility]  [phone]   [facility] [address] [place], [place], [place] Phone: [phone]   Fax:  [phone]  Name: Linda Page MRN: Orlena Sheldon Date of Birth: [birth year]

## 2019-04-09 ENCOUNTER — Other Ambulatory Visit: Payer: Self-pay

## 2019-04-09 ENCOUNTER — Ambulatory Visit (HOSPITAL_COMMUNITY): Payer: BC Managed Care – PPO | Admitting: Physical Therapy

## 2019-04-09 ENCOUNTER — Encounter (HOSPITAL_COMMUNITY): Payer: Self-pay | Admitting: Physical Therapy

## 2019-04-09 DIAGNOSIS — R262 Difficulty in walking, not elsewhere classified: Secondary | ICD-10-CM

## 2019-04-09 DIAGNOSIS — R2689 Other abnormalities of gait and mobility: Secondary | ICD-10-CM

## 2019-04-09 DIAGNOSIS — M25561 Pain in right knee: Secondary | ICD-10-CM

## 2019-04-09 NOTE — Therapy (Signed)
[facility] [address] [place], [place], [address] Phone: [phone]   Fax:  [phone]  Physical Therapy Treatment  Patient Details  Name: Linda Page MRN: [id number] Date of Birth: 15-Feb-1950 Referring Provider (PT): Samson Frederic MD    Encounter Date: 04/09/2019  PT End of Session - 04/09/19 1021    Visit Number  2    Number of Visits  18    Date for PT Re-Evaluation  05/23/19    Authorization Type  [facility] PPO, no visit limit, no auth req    Authorization Time Period  04/07/19- 05/23/19    Progress Note Due on Visit  --   05/02/19   PT Start Time  1030    PT Stop Time  1110    PT Time Calculation (min)  40 min    Activity Tolerance  Patient tolerated treatment well    Behavior During Therapy  [facility] for tasks assessed/performed       Past Medical History:  Diagnosis Date  . Anxiety   . Arthritis    in neck, in knee has gel shots givin in knee once a week for 3 weeks  . Depression   . Hypertension     Past Surgical History:  Procedure Laterality Date  . ANTERIOR CERVICAL DECOMP/DISCECTOMY FUSION N/A 04/29/2015   Procedure: ACDF C4-7;  Surgeon: Venita Lick, MD;  Location: [facility] OR;  Service: Orthopedics;  Laterality: N/A;  . cataract surgery     bilateral  . COLONOSCOPY WITH PROPOFOL N/A 11/05/2018   Procedure: COLONOSCOPY WITH PROPOFOL;  Surgeon: West Bali, MD;  Location: [facility];  Service: Endoscopy;  Laterality: N/A;  10:30am  . HAND SURGERY     rt  . KNEE ARTHROPLASTY Right 04/03/2019   Procedure: COMPUTER ASSISTED TOTAL KNEE ARTHROPLASTY;  Surgeon: Samson Frederic, MD;  Location: [facility] ORS;  Service: Orthopedics;  Laterality: Right;  . KNEE SURGERY     rt    There were no vitals filed for this visit.  Subjective Assessment - 04/09/19 1036    Subjective  Patient says knee is very stiff and very sore today. Says she was concerned about level of swelling in RT lower leg. Says she is doing fine  with home exercises. Says sleeping is still difficult due to pain.    Limitations  Sitting;Lifting;Standing;Walking;House hold activities    How long can you stand comfortably?  5-10 min    How long can you walk comfortably?  5-10 min    Patient Stated Goals  Get to point where I am healing again    Currently in Pain?  Yes    Pain Score  4     Pain Location  Knee    Pain Orientation  Right;Anterior    Pain Descriptors / Indicators  Aching;Sore    Pain Type  Surgical pain    Pain Onset  In the past 7 days    Pain Frequency  Constant                       [facility] Adult PT Treatment/Exercise - 04/09/19 0001      Knee/Hip Exercises: Supine   Quad Sets  Right;10 reps    Quad Sets Limitations  5"    Heel Slides  Right;AAROM;10 reps    Heel Slides Limitations  5" with strap     Heel Prop for Knee Extension  2 minutes    Straight Leg Raises  Right   3 reps, before fatigue, still fairly challenging   Knee Extension  AROM;Right    Knee Extension Limitations  8    Knee Flexion  AAROM;Right    Knee Flexion Limitations  90    Other Supine Knee/Hip Exercises  glute set 10 x 5 sec hold       Manual Therapy   Manual Therapy  Edema management    Manual therapy comments  Manual treatment performed separate from all other activity    Edema Management  retrograde message to RLE with leg elevated for imporved fluid return and decreased swelling             PT Education - 04/09/19 1146    Education Details  on post op brusing and sweeling, DVT prevention, adding ankle pumps to HEP    Person(s) Educated  Patient    Methods  Explanation    Comprehension  Verbalized understanding       PT Short Term Goals - 04/07/19 1344      PT SHORT TERM GOAL #1   Title  Patient will be independent with initial HEP to improve functional outcomes    Time  3    Period  Weeks    Status  New    Target Date  05/02/19      PT SHORT TERM GOAL #2   Title  Patient will have RT knee AROM  0-100 degrees to improve functional mobility and facilitate normal ambulation.    Time  3    Period  Weeks    Status  New    Target Date  05/02/19        PT Long Term Goals - 04/07/19 1345      PT LONG TERM GOAL #1   Title  Patient will have RT knee AROM 0-120 degrees to improve functional mobility and facilitate squatting to pick up items from floor.    Time  6    Period  Weeks    Status  New    Target Date  05/23/19      PT LONG TERM GOAL #2   Title  Patient will improve FOTO score to <30% to indicate improvement in functional outcomes    Time  6    Period  Weeks    Status  New    Target Date  05/23/19      PT LONG TERM GOAL #3   Title  Patient will have equal to or > 4+/5 MMT throughout RLE to improve ability to perform functional mobility, stair ambulation and ADLs.    Time  6    Period  Weeks    Status  New    Target Date  05/23/19      PT LONG TERM GOAL #4   Title  Patient will be able to ambulate at least 350 feet during 2MWT with LRAD to demonstrate improved ability to perform functional mobility and associated tasks.    Time  6    Period  Weeks    Status  New    Target Date  05/23/19            Plan - 04/09/19 1147    Clinical Impression Statement  Patient presents with moderate pain and swelling today. Initiated treatment program. Reviewed goals and HEP. Patient cued on performing AAROM stretching of RT knee to end range to progress mobility. Patient with good improvement in knee flexion AAROM today. Patient still limited by quad weakness and was unable  to perform multiple reps of SLR. Discussed that swelling and bruising is normal at this stage post operatively. Perform Homans test which was negative, educated patient on signs and sx of DVT and use of compression stocking, ankle pumps and regular elevation for DVT prevention. Added manual edema message for pain and swelling. Patient with noted improvement and decreased pain with ambulation post treatment.  Will continue to progress as tolerated.    Examination-Activity Limitations  Bathing;Lift;Stand;Bed Mobility;Bend;Transfers;Toileting;Locomotion Level;Carry;Sleep;Dressing;Squat;Hygiene/Grooming;Stairs    Examination-Participation Restrictions  Community Activity;Yard Work    Stability/Clinical Decision Making  Stable/Uncomplicated    Rehab Potential  Good    PT Frequency  3x / week    PT Duration  6 weeks    PT Treatment/Interventions  ADLs/Self Care Home Management;Aquatic Therapy;Biofeedback;Cryotherapy;Electrical Stimulation;Therapeutic exercise;Therapeutic activities;Ultrasound;Functional mobility training;Moist Heat;Stair training;Iontophoresis 4mg /ml Dexamethasone;DME Instruction;Gait training;Balance training;Neuromuscular re-education;Manual techniques;Patient/family education;Orthotic Fit/Training;Compression bandaging;Vasopneumatic Device;Taping;Joint Manipulations;Splinting;Energy conservation;Spinal Manipulations;Dry needling;Scar mobilization;Passive range of motion    PT Next Visit Plan  Progress RT knee AROM, and strength as tolerated, with focus on full extension, and gait with LRAD. Progress to standing as tolerated. Use manuals PRN    PT Home Exercise Plan  04/07/19: quad set, heel slide with rope, glute set, heel prop, 04/09/19: ankle pumps    Consulted and Agree with Plan of Care  Patient       Patient will benefit from skilled therapeutic intervention in order to improve the following deficits and impairments:  Abnormal gait, Pain, Decreased mobility, Decreased scar mobility, Decreased activity tolerance, Decreased endurance, Decreased range of motion, Decreased strength, Hypomobility, Decreased balance, Difficulty walking, Increased edema, Impaired flexibility  Visit Diagnosis: Right knee pain, unspecified chronicity  Other abnormalities of gait and mobility  Difficulty in walking, not elsewhere classified     Problem List Patient Active Problem List   Diagnosis Date  Noted  . Osteoarthritis of right knee 04/03/2019  . Preventative health care 08/12/2018  . Neck pain 04/29/2015  . Degeneration of cervical intervertebral disc 09/24/2013  . Pain in joint, shoulder region 09/24/2013  . Muscle weakness (generalized) 09/24/2013  . Decreased range of motion of neck 09/24/2013   11:50 AM, 04/09/19 04/11/19 PT DPT  Physical Therapist with [facility]  [facility]  [phone]   [facility] [address] [place], [place], [place] Phone: [phone]   Fax:  [phone]  Name: Linda Page MRN: Orlena Sheldon Date of Birth: 24-May-1950

## 2019-04-10 ENCOUNTER — Encounter (HOSPITAL_COMMUNITY): Payer: BC Managed Care – PPO | Admitting: Physical Therapy

## 2019-04-10 ENCOUNTER — Telehealth (HOSPITAL_COMMUNITY): Payer: Self-pay | Admitting: Physical Therapy

## 2019-04-10 NOTE — Telephone Encounter (Signed)
Called patient about missed appointment today, voicemail box full.  11:34 AM, 04/10/19 Georges Lynch PT DPT  Physical Therapist with [facility]  [phone]

## 2019-04-14 ENCOUNTER — Encounter (HOSPITAL_COMMUNITY): Payer: Self-pay | Admitting: Physical Therapy

## 2019-04-14 ENCOUNTER — Ambulatory Visit (HOSPITAL_COMMUNITY): Payer: BC Managed Care – PPO | Attending: Orthopedic Surgery | Admitting: Physical Therapy

## 2019-04-14 ENCOUNTER — Other Ambulatory Visit: Payer: Self-pay

## 2019-04-14 DIAGNOSIS — R2689 Other abnormalities of gait and mobility: Secondary | ICD-10-CM | POA: Insufficient documentation

## 2019-04-14 DIAGNOSIS — M25561 Pain in right knee: Secondary | ICD-10-CM | POA: Diagnosis not present

## 2019-04-14 DIAGNOSIS — R262 Difficulty in walking, not elsewhere classified: Secondary | ICD-10-CM | POA: Diagnosis present

## 2019-04-14 NOTE — Patient Instructions (Signed)
Access Code: [facility] URL: [email or web address] Date: 04/14/2019 Prepared by: Georges Lynch  Exercises Supine Calf Stretch with Strap - 3 x daily - 7 x weekly - 1 sets - 3 reps - 30 hold Seated Hamstring Stretch - 3 x daily - 7 x weekly - 1 sets - 3 reps - 30 hold

## 2019-04-14 NOTE — Therapy (Signed)
[facility] [address] [place], [place], [address] Phone: [phone]   Fax:  [phone]  Physical Therapy Treatment  Patient Details  Name: Linda Page MRN: [id number] Date of Birth: 03/17/1950 Referring Provider (PT): Samson Frederic MD    Encounter Date: 04/14/2019  PT End of Session - 04/14/19 0905    Visit Number  3    Number of Visits  18    Date for PT Re-Evaluation  05/23/19    Authorization Type  [facility] PPO, no visit limit, no auth req    Authorization Time Period  04/07/19- 05/23/19    Progress Note Due on Visit  --   05/02/19   PT Start Time  0900    PT Stop Time  0945    PT Time Calculation (min)  45 min    Activity Tolerance  Patient tolerated treatment well    Behavior During Therapy  [facility] for tasks assessed/performed       Past Medical History:  Diagnosis Date  . Anxiety   . Arthritis    in neck, in knee has gel shots givin in knee once a week for 3 weeks  . Depression   . Hypertension     Past Surgical History:  Procedure Laterality Date  . ANTERIOR CERVICAL DECOMP/DISCECTOMY FUSION N/A 04/29/2015   Procedure: ACDF C4-7;  Surgeon: Venita Lick, MD;  Location: [facility] OR;  Service: Orthopedics;  Laterality: N/A;  . cataract surgery     bilateral  . COLONOSCOPY WITH PROPOFOL N/A 11/05/2018   Procedure: COLONOSCOPY WITH PROPOFOL;  Surgeon: West Bali, MD;  [facility];  Service: Endoscopy;  Laterality: N/A;  10:30am  . HAND SURGERY     rt  . KNEE ARTHROPLASTY Right 04/03/2019   Procedure: COMPUTER ASSISTED TOTAL KNEE ARTHROPLASTY;  Surgeon: Samson Frederic, MD;  Location: [facility]S;  Service: Orthopedics;  Laterality: Right;  . KNEE SURGERY     rt    There were no vitals filed for this visit.  Subjective Assessment - 04/14/19 0910    Subjective  Patient says knee is very sore today. Notes ongoing pain and swelling, says pain is more toward knee now. Say she did some walking around  house and this is sore. Says she took it easy this weekend, didn't do much. Reports compliance with HEP.    Limitations  Sitting;Lifting;Standing;Walking;House hold activities    How long can you stand comfortably?  5-10 min    How long can you walk comfortably?  5-10 min    Patient Stated Goals  Get to point where I am healing again    Currently in Pain?  Yes    Pain Score  5     Pain Location  Knee    Pain Orientation  Right;Anterior    Pain Descriptors / Indicators  Aching;Sore    Pain Type  Surgical pain    Pain Onset  1 to 4 weeks ago                       [facility] Adult PT Treatment/Exercise - 04/14/19 0001      Knee/Hip Exercises: Stretches   Passive Hamstring Stretch  Right;3 reps;30 seconds    Gastroc Stretch  Right;3 reps;30 seconds    Gastroc Stretch Limitations  supine with strap       Knee/Hip Exercises: Supine   Quad Sets  Right;10 reps    Quad Sets Limitations  5"  Heel Slides  Right;AAROM;10 reps    Heel Slides Limitations  5" with strap     Heel Prop for Knee Extension  2 minutes    Heel Prop for Knee Extension Weight (lbs)  2#    Straight Leg Raises  Right;10 reps    Knee Extension  AROM;Right    Knee Extension Limitations  8    Knee Flexion  AAROM;Right    Knee Flexion Limitations  93    Other Supine Knee/Hip Exercises  glute set 10 x 5 sec hold       Manual Therapy   Manual Therapy  Edema management;Passive ROM    Manual therapy comments  Manual treatment performed separate from all other activity    Edema Management  retrograde message to RLE with leg elevated for imporved fluid return and decreased swelling    Passive ROM  PROM RT knee extension 3 x 30"              PT Education - 04/14/19 1023    Education Details  On monitoring redness/swelling about lateral perimeter of bandage. On importance of frequency and duration of heel prop, with added weight, and addition of calf stretching and seated hamstring stretch for improve RT  knee ext AROM.    Person(s) Educated  Patient    Methods  Explanation;Handout    Comprehension  Verbalized understanding       PT Short Term Goals - 04/07/19 1344      PT SHORT TERM GOAL #1   Title  Patient will be independent with initial HEP to improve functional outcomes    Time  3    Period  Weeks    Status  New    Target Date  05/02/19      PT SHORT TERM GOAL #2   Title  Patient will have RT knee AROM 0-100 degrees to improve functional mobility and facilitate normal ambulation.    Time  3    Period  Weeks    Status  New    Target Date  05/02/19        PT Long Term Goals - 04/07/19 1345      PT LONG TERM GOAL #1   Title  Patient will have RT knee AROM 0-120 degrees to improve functional mobility and facilitate squatting to pick up items from floor.    Time  6    Period  Weeks    Status  New    Target Date  05/23/19      PT LONG TERM GOAL #2   Title  Patient will improve FOTO score to <30% to indicate improvement in functional outcomes    Time  6    Period  Weeks    Status  New    Target Date  05/23/19      PT LONG TERM GOAL #3   Title  Patient will have equal to or > 4+/5 MMT throughout RLE to improve ability to perform functional mobility, stair ambulation and ADLs.    Time  6    Period  Weeks    Status  New    Target Date  05/23/19      PT LONG TERM GOAL #4   Title  Patient will be able to ambulate at least 350 feet during 2MWT with LRAD to demonstrate improved ability to perform functional mobility and associated tasks.    Time  6    Period  Weeks    Status  New  Target Date  05/23/19            Plan - 04/14/19 1022    Clinical Impression Statement  Patient continues to be limited in RT knee AROM. Initiated todays session with manual treatment to address knee pain and swelling. Noted redness and swelling about lateral aspect of RT knee, post op bandage remains intact. Instructed patient to keep an eye on this to see if swelling or redness  worsens.  Educated patient on frequency and duration of HEP exercises and on addition of supine calf stretching and seated hamstring stretching for improved knee extension AROM. Patient was able to perform SLR for 10 reps today, indicated improved quad activation, but with noted quad lag. Patient did note decrease knee pain and swelling post manual treatment. Patient issued updated HEP handout.    Examination-Activity Limitations  Bathing;Lift;Stand;Bed Mobility;Bend;Transfers;Toileting;Locomotion Level;Carry;Sleep;Dressing;Squat;Hygiene/Grooming;Stairs    Examination-Participation Restrictions  Community Activity;Yard Work    Stability/Clinical Decision Making  Stable/Uncomplicated    Rehab Potential  Good    PT Frequency  3x / week    PT Duration  6 weeks    PT Treatment/Interventions  ADLs/Self Care Home Management;Aquatic Therapy;Biofeedback;Cryotherapy;Electrical Stimulation;Therapeutic exercise;Therapeutic activities;Ultrasound;Functional mobility training;Moist Heat;Stair training;Iontophoresis 4mg /ml Dexamethasone;DME Instruction;Gait training;Balance training;Neuromuscular re-education;Manual techniques;Patient/family education;Orthotic Fit/Training;Compression bandaging;Vasopneumatic Device;Taping;Joint Manipulations;Splinting;Energy conservation;Spinal Manipulations;Dry needling;Scar mobilization;Passive range of motion    PT Next Visit Plan  Progress RT knee AROM, and strength as tolerated, with focus on full extension, and gait with LRAD. Progress to standing as tolerated.Continue manuals for pain and swelling, monitor redness/ swelling at lateral RT knee bandage    PT Home Exercise Plan  04/07/19: quad set, heel slide with rope, glute set, heel prop, 04/09/19: ankle pumps    Consulted and Agree with Plan of Care  Patient       Patient will benefit from skilled therapeutic intervention in order to improve the following deficits and impairments:  Abnormal gait, Pain, Decreased mobility,  Decreased scar mobility, Decreased activity tolerance, Decreased endurance, Decreased range of motion, Decreased strength, Hypomobility, Decreased balance, Difficulty walking, Increased edema, Impaired flexibility  Visit Diagnosis: Right knee pain, unspecified chronicity  Other abnormalities of gait and mobility  Difficulty in walking, not elsewhere classified     Problem List Patient Active Problem List   Diagnosis Date Noted  . Osteoarthritis of right knee 04/03/2019  . Preventative health care 08/12/2018  . Neck pain 04/29/2015  . Degeneration of cervical intervertebral disc 09/24/2013  . Pain in joint, shoulder region 09/24/2013  . Muscle weakness (generalized) 09/24/2013  . Decreased range of motion of neck 09/24/2013   10:27 AM, 04/14/19 06/14/19 PT DPT  Physical Therapist with [facility]  [facility]  [phone]   [facility] [address] [place], [place], [place] Phone: [phone]   Fax:  [phone]  Name: Linda Page MRN: Orlena Sheldon Date of Birth: 02-May-1950

## 2019-04-16 ENCOUNTER — Other Ambulatory Visit: Payer: Self-pay

## 2019-04-16 ENCOUNTER — Ambulatory Visit (HOSPITAL_COMMUNITY): Payer: BC Managed Care – PPO | Admitting: Physical Therapy

## 2019-04-16 ENCOUNTER — Encounter (HOSPITAL_COMMUNITY): Payer: Self-pay | Admitting: Physical Therapy

## 2019-04-16 DIAGNOSIS — M25561 Pain in right knee: Secondary | ICD-10-CM | POA: Diagnosis not present

## 2019-04-16 DIAGNOSIS — R262 Difficulty in walking, not elsewhere classified: Secondary | ICD-10-CM

## 2019-04-16 DIAGNOSIS — R2689 Other abnormalities of gait and mobility: Secondary | ICD-10-CM

## 2019-04-16 NOTE — Therapy (Signed)
[facility] [address] [place], [place], [address] Phone: [phone]   Fax:  [phone]  Physical Therapy Treatment  Patient Details  Name: Linda Page MRN: [id number] Date of Birth: [birth year] Referring Provider (PT): Samson Frederic MD    Encounter Date: 04/16/2019  PT End of Session - 04/16/19 1132    Visit Number  4    Number of Visits  18    Date for PT Re-Evaluation  05/23/19    Authorization Type  [facility] PPO, no visit limit, no auth req    Authorization Time Period  04/07/19- 05/23/19    Progress Note Due on Visit  10   05/02/19   PT Start Time  1115    PT Stop Time  1205    PT Time Calculation (min)  50 min    Equipment Utilized During Treatment  Gait belt    Activity Tolerance  Patient tolerated treatment well    Behavior During Therapy  [facility] for tasks assessed/performed       Past Medical History:  Diagnosis Date  . Anxiety   . Arthritis    in neck, in knee has gel shots givin in knee once a week for 3 weeks  . Depression   . Hypertension     Past Surgical History:  Procedure Laterality Date  . ANTERIOR CERVICAL DECOMP/DISCECTOMY FUSION N/A 04/29/2015   Procedure: ACDF C4-7;  Surgeon: Venita Lick, MD;  [facility]: [facility] OR;  Service: Orthopedics;  Laterality: N/A;  . cataract surgery     bilateral  . COLONOSCOPY WITH PROPOFOL N/A 11/05/2018   Procedure: COLONOSCOPY WITH PROPOFOL;  Surgeon: West Bali, MD;  [facility];  Service: Endoscopy;  Laterality: N/A;  10:30am  . HAND SURGERY     rt  . KNEE ARTHROPLASTY Right 04/03/2019   Procedure: COMPUTER ASSISTED TOTAL KNEE ARTHROPLASTY;  Surgeon: Samson Frederic, MD;  [facility]: [facility];  Service: Orthopedics;  Laterality: Right;  . KNEE SURGERY     rt    There were no vitals filed for this visit.  Subjective Assessment - 04/16/19 1129    Subjective  Patient says she was worried her knee was infected due to increased redness around  incision site. Says she called MD and they moved her f/u appointment to tomorrow. Says she took old bandage off to take picture for MD and replaced with bandages and gauze she had at home. Says pain is not bad today, but was worried about infection.    Limitations  Sitting;Lifting;Standing;Walking;House hold activities    How long can you stand comfortably?  5-10 min    How long can you walk comfortably?  5-10 min    Patient Stated Goals  Get to point where I am healing again    Currently in Pain?  Yes    Pain Score  2     Pain Location  Knee    Pain Orientation  Right;Anterior    Pain Descriptors / Indicators  Aching    Pain Type  Surgical pain    Pain Onset  1 to 4 weeks ago                       [facility] Adult PT Treatment/Exercise - 04/16/19 0001      Knee/Hip Exercises: Stretches   Gastroc Stretch  Right;3 reps;30 seconds    Gastroc Stretch Limitations  supine with strap       Knee/Hip Exercises:  Standing   Heel Raises  Both;15 reps    Gait Training  120' holding SPC, stand by assist     Other Standing Knee Exercises  tandem stance, solid floor, 2 x 20"      Knee/Hip Exercises: Supine   Quad Sets  Right;10 reps    Quad Sets Limitations  5 sec hold     Heel Slides  Right;AAROM;5 reps    Heel Slides Limitations  5" with strap     Bridges  Both;2 sets;10 reps    Heel Prop for Knee Extension  2 minutes    Straight Leg Raises  Right;2 sets;10 reps    Knee Extension  AROM;Right    Knee Extension Limitations  4    Knee Flexion  AROM    Knee Flexion Limitations  92      Manual Therapy   Manual Therapy  Edema management;Passive ROM    Manual therapy comments  Manual treatment performed separate from all other activity    Edema Management  retrograde message to RLE with leg elevated for imporved fluid return and decreased swelling    Passive ROM  PROM RT knee extension 3 x 30"                PT Short Term Goals - 04/07/19 1344      PT SHORT TERM GOAL #1    Title  Patient will be independent with initial HEP to improve functional outcomes    Time  3    Period  Weeks    Status  New    Target Date  05/02/19      PT SHORT TERM GOAL #2   Title  Patient will have RT knee AROM 0-100 degrees to improve functional mobility and facilitate normal ambulation.    Time  3    Period  Weeks    Status  New    Target Date  05/02/19        PT Long Term Goals - 04/07/19 1345      PT LONG TERM GOAL #1   Title  Patient will have RT knee AROM 0-120 degrees to improve functional mobility and facilitate squatting to pick up items from floor.    Time  6    Period  Weeks    Status  New    Target Date  05/23/19      PT LONG TERM GOAL #2   Title  Patient will improve FOTO score to <30% to indicate improvement in functional outcomes    Time  6    Period  Weeks    Status  New    Target Date  05/23/19      PT LONG TERM GOAL #3   Title  Patient will have equal to or > 4+/5 MMT throughout RLE to improve ability to perform functional mobility, stair ambulation and ADLs.    Time  6    Period  Weeks    Status  New    Target Date  05/23/19      PT LONG TERM GOAL #4   Title  Patient will be able to ambulate at least 350 feet during with LRAD to demonstrate improved ability to perform functional mobility and associated tasks.    Time  6    Period  Weeks    Status  New    Target Date  05/23/19            Plan - 04/16/19 1220  Clinical Impression Statement  Patient is progressing well toward therapy goals, despite recent concern about infection. Observed incision site with bandages removed, area does not appear any worse that last visit, and swelling as well as bruising continue to improve. Patient shows improving knee AROM, and less reported pain during today's treatment. Patient was able to progress to standing static balance, which went well so patient progressed to ambulation with cane. Patient was educated on 2 point gait pattern, but had  some difficulty with return at which point we assessed ambulation with no AD, therapist on stand by assist. Patient did very well with this, slight noted LT deviation, and small stride length, decreased heel strike on RT side. Patient tolerated todays session well with no increased complaint of pain. Clean bandage applied at end of today's session.  Patient to f/u with MD on Friday.    Examination-Activity Limitations  Bathing;Lift;Stand;Bed Mobility;Bend;Transfers;Toileting;Locomotion Level;Carry;Sleep;Dressing;Squat;Hygiene/Grooming;Stairs    Examination-Participation Restrictions  Community Activity;Yard Work    Stability/Clinical Decision Making  Stable/Uncomplicated    Rehab Potential  Good    PT Frequency  3x / week    PT Duration  6 weeks    PT Treatment/Interventions  ADLs/Self Care Home Management;Aquatic Therapy;Biofeedback;Cryotherapy;Electrical Stimulation;Therapeutic exercise;Therapeutic activities;Ultrasound;Functional mobility training;Moist Heat;Stair training;Iontophoresis 4mg /ml Dexamethasone;DME Instruction;Gait training;Balance training;Neuromuscular re-education;Manual techniques;Patient/family education;Orthotic Fit/Training;Compression bandaging;Vasopneumatic Device;Taping;Joint Manipulations;Splinting;Energy conservation;Spinal Manipulations;Dry needling;Scar mobilization;Passive range of motion    PT Next Visit Plan  Progress RT knee AROM, and strength as tolerated, with focus on full extension, and gait with LRAD. Progress to standing as tolerated.Continue manuals for pain and swelling, monitor redness/ swelling at lateral RT knee bandage    PT Home Exercise Plan  04/07/19: quad set, heel slide with rope, glute set, heel prop, 04/09/19: ankle pumps    Consulted and Agree with Plan of Care  Patient       Patient will benefit from skilled therapeutic intervention in order to improve the following deficits and impairments:  Abnormal gait, Pain, Decreased mobility, Decreased scar  mobility, Decreased activity tolerance, Decreased endurance, Decreased range of motion, Decreased strength, Hypomobility, Decreased balance, Difficulty walking, Increased edema, Impaired flexibility  Visit Diagnosis: Right knee pain, unspecified chronicity  Other abnormalities of gait and mobility  Difficulty in walking, not elsewhere classified     Problem List Patient Active Problem List   Diagnosis Date Noted  . Osteoarthritis of right knee 04/03/2019  . Preventative health care 08/12/2018  . Neck pain 04/29/2015  . Degeneration of cervical intervertebral disc 09/24/2013  . Pain in joint, shoulder region 09/24/2013  . Muscle weakness (generalized) 09/24/2013  . Decreased range of motion of neck 09/24/2013    12:28 PM, 04/16/19 Josue Hector PT DPT  Physical Therapist with [facility]  [phone] [address] [place], [place], [address] Phone: [phone]   Fax:  [phone]  Name: JAZEL NIMMONS MRN: [id number] Date of Birth: 20-Jul-1950

## 2019-04-17 ENCOUNTER — Ambulatory Visit (HOSPITAL_COMMUNITY): Payer: BC Managed Care – PPO | Admitting: Physical Therapy

## 2019-04-17 DIAGNOSIS — R262 Difficulty in walking, not elsewhere classified: Secondary | ICD-10-CM

## 2019-04-17 DIAGNOSIS — M25561 Pain in right knee: Secondary | ICD-10-CM | POA: Diagnosis not present

## 2019-04-17 DIAGNOSIS — R2689 Other abnormalities of gait and mobility: Secondary | ICD-10-CM

## 2019-04-17 NOTE — Therapy (Signed)
[facility] [address] [place], [place], [address] Phone: [phone]   Fax:  [phone]  Physical Therapy Treatment  Patient Details  Name: Linda Page MRN: [id number] Date of Birth: [birth year] Referring Provider (PT): Samson Frederic MD    Encounter Date: 04/17/2019    Past Medical History:  Diagnosis Date  . Anxiety   . Arthritis    in neck, in knee has gel shots givin in knee once a week for 3 weeks  . Depression   . Hypertension     Past Surgical History:  Procedure Laterality Date  . ANTERIOR CERVICAL DECOMP/DISCECTOMY FUSION N/A 04/29/2015   Procedure: ACDF C4-7;  Surgeon: Venita Lick, MD;  [facility]: [facility] OR;  Service: Orthopedics;  Laterality: N/A;  . cataract surgery     bilateral  . COLONOSCOPY WITH PROPOFOL N/A 11/05/2018   Procedure: COLONOSCOPY WITH PROPOFOL;  Surgeon: West Bali, MD;  [facility];  Service: Endoscopy;  Laterality: N/A;  10:30am  . HAND SURGERY     rt  . KNEE ARTHROPLASTY Right 04/03/2019   Procedure: COMPUTER ASSISTED TOTAL KNEE ARTHROPLASTY;  Surgeon: Samson Frederic, MD;  [facility];  Service: Orthopedics;  Laterality: Right;  . KNEE SURGERY     rt    There were no vitals filed for this visit.  Subjective Assessment - 04/17/19 1219    Subjective  pt states she returns to MD Friday.  States it is more sore today and had to get up and ice it around 4 am due to pain.    Currently in Pain?  Yes    Pain Score  3     Pain Location  Knee    Pain Orientation  Right    Pain Descriptors / Indicators  Aching    Pain Type  Surgical pain                       [facility] Adult PT Treatment/Exercise - 04/17/19 0001      Knee/Hip Exercises: Stretches   Passive Hamstring Stretch  Right;3 reps;30 seconds    Passive Hamstring Stretch Limitations  standing on 12" step    Gastroc Stretch  Right;2 reps;30 seconds    Gastroc Stretch Limitations  standing slant board       Knee/Hip Exercises: Standing   Heel Raises  Both;15 reps    Gait Training  120' holding SPC, stand by assist     Other Standing Knee Exercises  tandem stance, solid floor, 2 x 20"      Knee/Hip Exercises: Seated   Other Seated Knee/Hip Exercises  toeraises      Knee/Hip Exercises: Supine   Quad Sets  Right;10 reps    Heel Slides  Right;AAROM;5 reps    Heel Slides Limitations  5" with strap     Bridges  Both;2 sets;10 reps    Knee Extension  AROM;Right    Knee Extension Limitations  5    Knee Flexion  AROM    Knee Flexion Limitations  100      Manual Therapy   Manual Therapy  Edema management;Passive ROM    Manual therapy comments  Manual treatment performed separate from all other activity    Edema Management  retrograde massage to RLE with leg elevated for imporved fluid return and decreased swelling             PT Education - 04/17/19 1209    Education Details  Encouraged  patient to complete ankle exericises that she is doing on Lt on Rt as well.  Increasing ambulaiton without use of RW    Person(s) Educated  Patient    Methods  Explanation    Comprehension  Verbalized understanding       PT Short Term Goals - 04/07/19 1344      PT SHORT TERM GOAL #1   Title  Patient will be independent with initial HEP to improve functional outcomes    Time  3    Period  Weeks    Status  New    Target Date  05/02/19      PT SHORT TERM GOAL #2   Title  Patient will have RT knee AROM 0-100 degrees to improve functional mobility and facilitate normal ambulation.    Time  3    Period  Weeks    Status  New    Target Date  05/02/19        PT Long Term Goals - 04/07/19 1345      PT LONG TERM GOAL #1   Title  Patient will have RT knee AROM 0-120 degrees to improve functional mobility and facilitate squatting to pick up items from floor.    Time  6    Period  Weeks    Status  New    Target Date  05/23/19      PT LONG TERM GOAL #2   Title  Patient will improve FOTO  score to <30% to indicate improvement in functional outcomes    Time  6    Period  Weeks    Status  New    Target Date  05/23/19      PT LONG TERM GOAL #3   Title  Patient will have equal to or > 4+/5 MMT throughout RLE to improve ability to perform functional mobility, stair ambulation and ADLs.    Time  6    Period  Weeks    Status  New    Target Date  05/23/19      PT LONG TERM GOAL #4   Title  Patient will be able to ambulate at least 350 feet during 2MWT with LRAD to demonstrate improved ability to perform functional mobility and associated tasks.    Time  6    Period  Weeks    Status  New    Target Date  05/23/19            Plan - 04/17/19 1211    Clinical Impression Statement  Pt with noted restrictions in Rt ankle and unable to do toeraises in standing position.    Encoruaged more stretching and strengthening of Rt ankle to improve function.   Added seated toeraises with very limited motion, however better than in standing.  Pt lacking 5 degrees from neutral for Rt DF.  Manual continued to Rt knee, however pt with more sensitivities and soreness.  pt returns to MD tomorrow for f/u and will recheck healing of incision.  Knee Flexion improved today to 100 degrees (was 92).    Examination-Activity Limitations  Bathing;Lift;Stand;Bed Mobility;Bend;Transfers;Toileting;Locomotion Level;Carry;Sleep;Dressing;Squat;Hygiene/Grooming;Stairs    Examination-Participation Restrictions  Community Activity;Yard Work    Stability/Clinical Decision Making  Stable/Uncomplicated    Rehab Potential  Good    PT Frequency  3x / week    PT Duration  6 weeks    PT Treatment/Interventions  ADLs/Self Care Home Management;Aquatic Therapy;Biofeedback;Cryotherapy;Electrical Stimulation;Therapeutic exercise;Therapeutic activities;Ultrasound;Functional mobility training;Moist Heat;Stair training;Iontophoresis 4mg /ml Dexamethasone;DME Instruction;Gait training;Balance training;Neuromuscular  re-education;Manual techniques;Patient/family  education;Orthotic Fit/Training;Compression bandaging;Vasopneumatic Device;Taping;Joint Manipulations;Splinting;Energy conservation;Spinal Manipulations;Dry needling;Scar mobilization;Passive range of motion    PT Next Visit Plan  Progress RT knee AROM, and strength as tolerated, with focus on full extension, and gait with LRAD. Progress to standing as tolerated.Continue manuals for pain and swelling, monitor redness/ swelling at lateral RT knee bandage.  Next session begin SLS and vectors, F/U on MD appointment    PT Home Exercise Plan  04/07/19: quad set, heel slide with rope, glute set, heel prop, 04/09/19: ankle pumps    Consulted and Agree with Plan of Care  Patient       Patient will benefit from skilled therapeutic intervention in order to improve the following deficits and impairments:  Abnormal gait, Pain, Decreased mobility, Decreased scar mobility, Decreased activity tolerance, Decreased endurance, Decreased range of motion, Decreased strength, Hypomobility, Decreased balance, Difficulty walking, Increased edema, Impaired flexibility  Visit Diagnosis: Right knee pain, unspecified chronicity  Other abnormalities of gait and mobility  Difficulty in walking, not elsewhere classified     Problem List Patient Active Problem List   Diagnosis Date Noted  . Osteoarthritis of right knee 04/03/2019  . Preventative health care 08/12/2018  . Neck pain 04/29/2015  . Degeneration of cervical intervertebral disc 09/24/2013  . Pain in joint, shoulder region 09/24/2013  . Muscle weakness (generalized) 09/24/2013  . Decreased range of motion of neck 09/24/2013   Lurena Nida, PTA/CLT [phone]  Lurena Nida 04/17/2019, 12:21 PM  [facility] [address] [place], [place], [address] Phone: [phone]   Fax:  [phone]  Name: REBEKHA DIVELEY MRN: [id number] Date of Birth: [birth year]

## 2019-04-18 ENCOUNTER — Encounter (HOSPITAL_COMMUNITY): Payer: BC Managed Care – PPO | Admitting: Physical Therapy

## 2019-04-22 ENCOUNTER — Other Ambulatory Visit: Payer: Self-pay

## 2019-04-22 ENCOUNTER — Ambulatory Visit (HOSPITAL_COMMUNITY): Payer: BC Managed Care – PPO | Admitting: Physical Therapy

## 2019-04-22 DIAGNOSIS — R262 Difficulty in walking, not elsewhere classified: Secondary | ICD-10-CM

## 2019-04-22 DIAGNOSIS — M25561 Pain in right knee: Secondary | ICD-10-CM | POA: Diagnosis not present

## 2019-04-22 DIAGNOSIS — R2689 Other abnormalities of gait and mobility: Secondary | ICD-10-CM

## 2019-04-22 NOTE — Therapy (Signed)
[facility] [address] [place], [place], [address] Phone: [phone]   Fax:  [phone]  Physical Therapy Treatment  Patient Details  Name: Linda Page MRN: [id number] Date of Birth: 1950-09-12 Referring Provider (PT): Samson Frederic MD    Encounter Date: 04/22/2019  PT End of Session - 04/22/19 1033    Visit Number  5    Number of Visits  18    Date for PT Re-Evaluation  05/23/19    Authorization Type  [facility] PPO, no visit limit, no auth req    Authorization Time Period  04/07/19- 05/23/19    Progress Note Due on Visit  10   05/02/19   PT Start Time  0918    PT Stop Time  1000    PT Time Calculation (min)  42 min    Equipment Utilized During Treatment  Gait belt    Activity Tolerance  Patient tolerated treatment well    Behavior During Therapy  [facility] for tasks assessed/performed       Past Medical History:  Diagnosis Date  . Anxiety   . Arthritis    in neck, in knee has gel shots givin in knee once a week for 3 weeks  . Depression   . Hypertension     Past Surgical History:  Procedure Laterality Date  . ANTERIOR CERVICAL DECOMP/DISCECTOMY FUSION N/A 04/29/2015   Procedure: ACDF C4-7;  Surgeon: Venita Lick, MD;  [facility]: [facility] OR;  Service: Orthopedics;  Laterality: N/A;  . cataract surgery     bilateral  . COLONOSCOPY WITH PROPOFOL N/A 11/05/2018   Procedure: COLONOSCOPY WITH PROPOFOL;  Surgeon: West Bali, MD;  [facility];  Service: Endoscopy;  Laterality: N/A;  10:30am  . HAND SURGERY     rt  . KNEE ARTHROPLASTY Right 04/03/2019   Procedure: COMPUTER ASSISTED TOTAL KNEE ARTHROPLASTY;  Surgeon: Samson Frederic, MD;  [facility];  Service: Orthopedics;  Laterality: Right;  . KNEE SURGERY     rt    There were no vitals filed for this visit.  Subjective Assessment - 04/22/19 0925    Subjective  pt states she went to MD on Friday and he bent it back to 105 degrees.  STates it's been  swollen and hurting worse ever since.  currently 5/10 pain today.    Currently in Pain?  Yes    Pain Score  5     Pain Location  Knee    Pain Orientation  Right    Pain Descriptors / Indicators  Aching             [facility] Adult PT Treatment/Exercise - 04/22/19 1031      Knee/Hip Exercises: Seated   Long Arc Quad  Right;10 reps      Knee/Hip Exercises: Supine   Quad Sets  Right;10 reps    Heel Slides  Right;10 reps    Heel Slides Limitations  5" with strap     Bridges  Both;10 reps      Manual Therapy   Manual Therapy  Soft tissue mobilization;Edema management    Manual therapy comments  Manual treatment performed separate from all other activity    Edema Management  retrograde massage to RLE with leg elevated for imporved fluid return and decreased swelling    Soft tissue mobilization  to reduce scar tissue and decrease pain and tightness               PT  Short Term Goals - 04/07/19 1344      PT SHORT TERM GOAL #1   Title  Patient will be independent with initial HEP to improve functional outcomes    Time  3    Period  Weeks    Status  New    Target Date  05/02/19      PT SHORT TERM GOAL #2   Title  Patient will have RT knee AROM 0-100 degrees to improve functional mobility and facilitate normal ambulation.    Time  3    Period  Weeks    Status  New    Target Date  05/02/19        PT Long Term Goals - 04/07/19 1345      PT LONG TERM GOAL #1   Title  Patient will have RT knee AROM 0-120 degrees to improve functional mobility and facilitate squatting to pick up items from floor.    Time  6    Period  Weeks    Status  New    Target Date  05/23/19      PT LONG TERM GOAL #2   Title  Patient will improve FOTO score to <30% to indicate improvement in functional outcomes    Time  6    Period  Weeks    Status  New    Target Date  05/23/19      PT LONG TERM GOAL #3   Title  Patient will have equal to or > 4+/5 MMT throughout RLE to improve ability to  perform functional mobility, stair ambulation and ADLs.    Time  6    Period  Weeks    Status  New    Target Date  05/23/19      PT LONG TERM GOAL #4   Title  Patient will be able to ambulate at least 350 feet during 2MWT with LRAD to demonstrate improved ability to perform functional mobility and associated tasks.    Time  6    Period  Weeks    Status  New    Target Date  05/23/19            Plan - 04/22/19 1043    Clinical Impression Statement  PT with increased pain and irritation from MD appointment on Friday.  Request to complete all NWB activites this session.  Majority of time spent on manual to help reduce edema, tightness of tissue and reduce pain. Pt with most tightness lateral of scar and on proximal/distal ends.  Pt reported overall improvement ot 3/10 at end of session.    Examination-Activity Limitations  Bathing;Lift;Stand;Bed Mobility;Bend;Transfers;Toileting;Locomotion Level;Carry;Sleep;Dressing;Squat;Hygiene/Grooming;Stairs    Examination-Participation Restrictions  Community Activity;Yard Work    Stability/Clinical Decision Making  Stable/Uncomplicated    Rehab Potential  Good    PT Frequency  3x / week    PT Duration  6 weeks    PT Treatment/Interventions  ADLs/Self Care Home Management;Aquatic Therapy;Biofeedback;Cryotherapy;Electrical Stimulation;Therapeutic exercise;Therapeutic activities;Ultrasound;Functional mobility training;Moist Heat;Stair training;Iontophoresis 4mg /ml Dexamethasone;DME Instruction;Gait training;Balance training;Neuromuscular re-education;Manual techniques;Patient/family education;Orthotic Fit/Training;Compression bandaging;Vasopneumatic Device;Taping;Joint Manipulations;Splinting;Energy conservation;Spinal Manipulations;Dry needling;Scar mobilization;Passive range of motion    PT Next Visit Plan  Progress RT knee AROM, and strength as tolerated, with focus on full extension, and gait with LRAD. Progress to standing as tolerated.Continue  manuals for pain and swelling, monitor redness/ swelling at lateral RT knee bandage.  Next session resume standing exercises and begin SLS and vectors    PT Home Exercise Plan  04/07/19: quad set, heel slide with  rope, glute set, heel prop, 04/09/19: ankle pumps    Consulted and Agree with Plan of Care  Patient       Patient will benefit from skilled therapeutic intervention in order to improve the following deficits and impairments:  Abnormal gait, Pain, Decreased mobility, Decreased scar mobility, Decreased activity tolerance, Decreased endurance, Decreased range of motion, Decreased strength, Hypomobility, Decreased balance, Difficulty walking, Increased edema, Impaired flexibility  Visit Diagnosis: Right knee pain, unspecified chronicity  Other abnormalities of gait and mobility  Difficulty in walking, not elsewhere classified     Problem List Patient Active Problem List   Diagnosis Date Noted  . Osteoarthritis of right knee 04/03/2019  . Preventative health care 08/12/2018  . Neck pain 04/29/2015  . Degeneration of cervical intervertebral disc 09/24/2013  . Pain in joint, shoulder region 09/24/2013  . Muscle weakness (generalized) 09/24/2013  . Decreased range of motion of neck 09/24/2013   Lurena Nida, PTA/CLT [phone]  Lurena Nida 04/22/2019, 10:48 AM  [place] [facility] [address] [place], [place], [address] Phone: [phone]   Fax:  [phone]  Name: Linda Page MRN: [id number] Date of Birth: [birth year]

## 2019-04-24 ENCOUNTER — Ambulatory Visit (HOSPITAL_COMMUNITY): Payer: BC Managed Care – PPO | Admitting: Physical Therapy

## 2019-04-24 ENCOUNTER — Other Ambulatory Visit: Payer: Self-pay

## 2019-04-24 ENCOUNTER — Encounter (HOSPITAL_COMMUNITY): Payer: Self-pay | Admitting: Physical Therapy

## 2019-04-24 DIAGNOSIS — R2689 Other abnormalities of gait and mobility: Secondary | ICD-10-CM

## 2019-04-24 DIAGNOSIS — M25561 Pain in right knee: Secondary | ICD-10-CM

## 2019-04-24 DIAGNOSIS — R262 Difficulty in walking, not elsewhere classified: Secondary | ICD-10-CM

## 2019-04-24 NOTE — Therapy (Signed)
[facility] [address] [place], [place], [address] Phone: [phone]   Fax:  [phone]  Physical Therapy Treatment  Patient Details  Name: Linda Page MRN: [id number] Date of Birth: 1950/01/19 Referring Provider (PT): Samson Frederic MD    Encounter Date: 04/24/2019  PT End of Session - 04/24/19 1127    Visit Number  6    Number of Visits  18    Date for PT Re-Evaluation  05/23/19    Authorization Type  [employer] PPO, no visit limit, no auth req    Authorization Time Period  --    Progress Note Due on Visit  10   05/02/19   PT Start Time  1120    PT Stop Time  1205    PT Time Calculation (min)  45 min    Equipment Utilized During Treatment  Gait belt    Activity Tolerance  Patient tolerated treatment well    Behavior During Therapy  [facility] for tasks assessed/performed       Past Medical History:  Diagnosis Date  . Anxiety   . Arthritis    in neck, in knee has gel shots givin in knee once a week for 3 weeks  . Depression   . Hypertension     Past Surgical History:  Procedure Laterality Date  . ANTERIOR CERVICAL DECOMP/DISCECTOMY FUSION N/A 04/29/2015   Procedure: ACDF C4-7;  Surgeon: Venita Lick, MD;  [facility];  Service: Orthopedics;  Laterality: N/A;  . cataract surgery     bilateral  . COLONOSCOPY WITH PROPOFOL N/A 11/05/2018   Procedure: COLONOSCOPY WITH PROPOFOL;  Surgeon: West Bali, MD;  [facility];  Service: Endoscopy;  Laterality: N/A;  10:30am  . HAND SURGERY     rt  . KNEE ARTHROPLASTY Right 04/03/2019   Procedure: COMPUTER ASSISTED TOTAL KNEE ARTHROPLASTY;  Surgeon: Samson Frederic, MD;  [facility]: [facility];  Service: Orthopedics;  Laterality: Right;  . KNEE SURGERY     rt    There were no vitals filed for this visit.  Subjective Assessment - 04/24/19 1126    Subjective  Patient says her knee was hurting a lot after going to MD office last week but says she was feeling  much better after manuals last therapy session. Says she has continued walking around home and is doing well, pain about 2 today.    Currently in Pain?  Yes    Pain Score  2     Pain Location  Knee    Pain Orientation  Right    Pain Descriptors / Indicators  Aching    Pain Type  Surgical pain                       [facility] Adult PT Treatment/Exercise - 04/24/19 0001      Knee/Hip Exercises: Stretches   Knee: Self-Stretch to increase Flexion  Right;5 reps;10 seconds    Knee: Self-Stretch Limitations  on 12 inch box     Gastroc Stretch  Both;3 reps;30 seconds    Gastroc Stretch Limitations  standing slant board      Knee/Hip Exercises: Aerobic   Recumbent Bike  4 minutes warmup for mobility       Knee/Hip Exercises: Standing   Heel Raises  Both;20 reps    Forward Step Up  Both;1 set;10 reps;Hand Hold: 2;Step Height: 4"    Gait Training  226' with SPC, cues for 2 point  gait; 226' no AD, cues for heel/ toe transition     Other Standing Knee Exercises  tandem stance, solid floor, 2 x 30"      Knee/Hip Exercises: Supine   Quad Sets  Right;10 reps    Quad Sets Limitations  in heel prop position    Heel Slides  Right;5 reps    Knee Extension  AROM;Right    Knee Extension Limitations  3    Knee Flexion  AROM    Knee Flexion Limitations  108      Manual Therapy   Manual Therapy  Soft tissue mobilization    Manual therapy comments  Manual treatment performed separate from all other activity    Soft tissue mobilization  scar tissue message to distal incision, STM to RT adductors, distal quad, lateral quad                PT Short Term Goals - 04/07/19 1344      PT SHORT TERM GOAL #1   Title  Patient will be independent with initial HEP to improve functional outcomes    Time  3    Period  Weeks    Status  New    Target Date  05/02/19      PT SHORT TERM GOAL #2   Title  Patient will have RT knee AROM 0-100 degrees to improve functional mobility and facilitate  normal ambulation.    Time  3    Period  Weeks    Status  New    Target Date  05/02/19        PT Long Term Goals - 04/07/19 1345      PT LONG TERM GOAL #1   Title  Patient will have RT knee AROM 0-120 degrees to improve functional mobility and facilitate squatting to pick up items from floor.    Time  6    Period  Weeks    Status  New    Target Date  05/23/19      PT LONG TERM GOAL #2   Title  Patient will improve FOTO score to <30% to indicate improvement in functional outcomes    Time  6    Period  Weeks    Status  New    Target Date  05/23/19      PT LONG TERM GOAL #3   Title  Patient will have equal to or > 4+/5 MMT throughout RLE to improve ability to perform functional mobility, stair ambulation and ADLs.    Time  6    Period  Weeks    Status  New    Target Date  05/23/19      PT LONG TERM GOAL #4   Title  Patient will be able to ambulate at least 350 feet during 2MWT with LRAD to demonstrate improved ability to perform functional mobility and associated tasks.    Time  6    Period  Weeks    Status  New    Target Date  05/23/19            Plan - 04/24/19 1250    Clinical Impression Statement  Patient with improved pain level today and able to progress to standing exercise. Added recumbent bike for knee mobility, patient able to complete full revolution today. Educated patient on proper form and function of all added activity. Patient continues to be challenged with static balance on RLE, but shows good stability during gait training. Patient able to progress gait  training from [facility] to no AD. Patient requires cueing for proper step length and heel strike on RLE. Patient making improvement in RT knee AROM, but continues to be mildly limited in extension. Making good progress with knee flexion. Added quad setting in heel prop position to improve extension AORM, patient educated on this modification with HEP.    Examination-Activity Limitations  Bathing;Lift;Stand;Bed  Mobility;Bend;Transfers;Toileting;Locomotion Level;Carry;Sleep;Dressing;Squat;Hygiene/Grooming;Stairs    Examination-Participation Restrictions  Community Activity;Yard Work    Stability/Clinical Decision Making  Stable/Uncomplicated    Rehab Potential  Good    PT Frequency  3x / week    PT Duration  6 weeks    PT Treatment/Interventions  ADLs/Self Care Home Management;Aquatic Therapy;Biofeedback;Cryotherapy;Electrical Stimulation;Therapeutic exercise;Therapeutic activities;Ultrasound;Functional mobility training;Moist Heat;Stair training;Iontophoresis 4mg /ml Dexamethasone;DME Instruction;Gait training;Balance training;Neuromuscular re-education;Manual techniques;Patient/family education;Orthotic Fit/Training;Compression bandaging;Vasopneumatic Device;Taping;Joint Manipulations;Splinting;Energy conservation;Spinal Manipulations;Dry needling;Scar mobilization;Passive range of motion    PT Next Visit Plan  Progress RT knee AROM, and strength as tolerated, with focus on full extension, and gait with LRAD. Add TKE and sit/stands next visit    PT Home Exercise Plan  04/07/19: quad set, heel slide with rope, glute set, heel prop, 04/09/19: ankle pumps    Consulted and Agree with Plan of Care  Patient       Patient will benefit from skilled therapeutic intervention in order to improve the following deficits and impairments:  Abnormal gait, Pain, Decreased mobility, Decreased scar mobility, Decreased activity tolerance, Decreased endurance, Decreased range of motion, Decreased strength, Hypomobility, Decreased balance, Difficulty walking, Increased edema, Impaired flexibility  Visit Diagnosis: Right knee pain, unspecified chronicity  Other abnormalities of gait and mobility  Difficulty in walking, not elsewhere classified     Problem List Patient Active Problem List   Diagnosis Date Noted  . Osteoarthritis of right knee 04/03/2019  . Preventative health care 08/12/2018  . Neck pain 04/29/2015  .  Degeneration of cervical intervertebral disc 09/24/2013  . Pain in joint, shoulder region 09/24/2013  . Muscle weakness (generalized) 09/24/2013  . Decreased range of motion of neck 09/24/2013   12:57 PM, 04/24/19 04/26/19 PT DPT  Physical Therapist with [facility]  [facility]  [phone]   [facility] [address] [place], [place], [place] Phone: [phone]   Fax:  [phone]  Name: Linda Page MRN: Orlena Sheldon Date of Birth: 16-Aug-1950

## 2019-04-28 ENCOUNTER — Ambulatory Visit (HOSPITAL_COMMUNITY): Payer: BC Managed Care – PPO | Admitting: Physical Therapy

## 2019-04-28 ENCOUNTER — Other Ambulatory Visit: Payer: Self-pay

## 2019-04-28 ENCOUNTER — Encounter (HOSPITAL_COMMUNITY): Payer: Self-pay | Admitting: Physical Therapy

## 2019-04-28 DIAGNOSIS — M25561 Pain in right knee: Secondary | ICD-10-CM | POA: Diagnosis not present

## 2019-04-28 DIAGNOSIS — R262 Difficulty in walking, not elsewhere classified: Secondary | ICD-10-CM

## 2019-04-28 DIAGNOSIS — R2689 Other abnormalities of gait and mobility: Secondary | ICD-10-CM

## 2019-04-28 NOTE — Therapy (Signed)
[facility] [address] [place], [place], [address] Phone: [phone]   Fax:  [phone]  Physical Therapy Treatment  Patient Details  Name: Linda Page MRN: [id number] Date of Birth: [birth year] Referring Provider (PT): Samson Frederic MD    Encounter Date: 04/28/2019  PT End of Session - 04/28/19 1039    Visit Number  7    Number of Visits  18    Date for PT Re-Evaluation  05/23/19    Authorization Type  [facility] PPO, no visit limit, no auth req    Progress Note Due on Visit  10   05/02/19   PT Start Time  1035    PT Stop Time  1115    PT Time Calculation (min)  40 min    Equipment Utilized During Treatment  Gait belt    Activity Tolerance  Patient tolerated treatment well    Behavior During Therapy  [facility] for tasks assessed/performed       Past Medical History:  Diagnosis Date  . Anxiety   . Arthritis    in neck, in knee has gel shots givin in knee once a week for 3 weeks  . Depression   . Hypertension     Past Surgical History:  Procedure Laterality Date  . ANTERIOR CERVICAL DECOMP/DISCECTOMY FUSION N/A 04/29/2015   Procedure: ACDF C4-7;  Surgeon: Venita Lick, MD;  [facility];  Service: Orthopedics;  Laterality: N/A;  . cataract surgery     bilateral  . COLONOSCOPY WITH PROPOFOL N/A 11/05/2018   Procedure: COLONOSCOPY WITH PROPOFOL;  Surgeon: West Bali, MD;  [facility];  Service: Endoscopy;  Laterality: N/A;  10:30am  . HAND SURGERY     rt  . KNEE ARTHROPLASTY Right 04/03/2019   Procedure: COMPUTER ASSISTED TOTAL KNEE ARTHROPLASTY;  Surgeon: Samson Frederic, MD;  Location[facility];  Service: Orthopedics;  Laterality: Right;  . KNEE SURGERY     rt    There were no vitals filed for this visit.  Subjective Assessment - 04/28/19 1038    Subjective  Patient says she has been walking more with no AD, says she is doing well and has no pain currenlty, "just sore".    Currently in Pain?   No/denies                       [facility] Adult PT Treatment/Exercise - 04/28/19 0001      Knee/Hip Exercises: Stretches   Knee: Self-Stretch to increase Flexion  Right;5 reps;10 seconds    Knee: Self-Stretch Limitations  on 12 inch box     Gastroc Stretch  Both;3 reps;30 seconds    Gastroc Stretch Limitations  standing slant board      Knee/Hip Exercises: Aerobic   Recumbent Bike  4 minutes lv 3 warmup for mobility       Knee/Hip Exercises: Standing   Heel Raises  Both;20 reps    Terminal Knee Extension  Right;10 reps;Theraband    Theraband Level (Terminal Knee Extension)  Other (comment)    Terminal Knee Extension Limitations  purple 5"     Forward Step Up  Both;1 set;Hand Hold: 2;15 reps;Step Height: 6"    Gait Training  226' no AD, cues for heel/ toe transition     Other Standing Knee Exercises  tandem stance, solid floor, 2 x 30"      Knee/Hip Exercises: Seated   Sit to Sand  10 reps;with UE  support      Knee/Hip Exercises: Supine   Quad Sets  Right;10 reps    Quad Sets Limitations  with heel prop     Heel Slides  Right;10 reps    Knee Extension  AROM;Right    Knee Extension Limitations  3    Knee Flexion  AROM;Right    Knee Flexion Limitations  110      Manual Therapy   Manual Therapy  Soft tissue mobilization    Manual therapy comments  Manual treatment performed separate from all other activity    Soft tissue mobilization  scar tissue message to distal incision, STM to RT adductors, distal quad, lateral quad                PT Short Term Goals - 04/07/19 1344      PT SHORT TERM GOAL #1   Title  Patient will be independent with initial HEP to improve functional outcomes    Time  3    Period  Weeks    Status  New    Target Date  05/02/19      PT SHORT TERM GOAL #2   Title  Patient will have RT knee AROM 0-100 degrees to improve functional mobility and facilitate normal ambulation.    Time  3    Period  Weeks    Status  New    Target  Date  05/02/19        PT Long Term Goals - 04/07/19 1345      PT LONG TERM GOAL #1   Title  Patient will have RT knee AROM 0-120 degrees to improve functional mobility and facilitate squatting to pick up items from floor.    Time  6    Period  Weeks    Status  New    Target Date  05/23/19      PT LONG TERM GOAL #2   Title  Patient will improve FOTO score to <30% to indicate improvement in functional outcomes    Time  6    Period  Weeks    Status  New    Target Date  05/23/19      PT LONG TERM GOAL #3   Title  Patient will have equal to or > 4+/5 MMT throughout RLE to improve ability to perform functional mobility, stair ambulation and ADLs.    Time  6    Period  Weeks    Status  New    Target Date  05/23/19      PT LONG TERM GOAL #4   Title  Patient will be able to ambulate at least 350 feet during with LRAD to demonstrate improved ability to perform functional mobility and associated tasks.    Time  6    Period  Weeks    Status  New    Target Date  05/23/19            Plan - 04/28/19 1200    Clinical Impression Statement  Patient is making good progress toward therapy goals. Patient showing steady improvement in knee AROM, but continues to be limited by RT quad weakness. Added band TKEs and sit to stands today, as well as increased step height to 6 inch box. Patient tolerated this well. Patient continues to have minimal swelling diffuse about Rt knee joint, but is improving with manual treatment.    Examination-Activity Limitations  Bathing;Lift;Stand;Bed Mobility;Bend;Transfers;Toileting;Locomotion Level;Carry;Sleep;Dressing;Squat;Hygiene/Grooming;Stairs    Examination-Participation Restrictions  Community Activity;Pincus Badder Work  Stability/Clinical Decision Making  Stable/Uncomplicated    Rehab Potential  Good    PT Frequency  3x / week    PT Duration  6 weeks    PT Treatment/Interventions  ADLs/Self Care Home Management;Aquatic  Therapy;Biofeedback;Cryotherapy;Electrical Stimulation;Therapeutic exercise;Therapeutic activities;Ultrasound;Functional mobility training;Moist Heat;Stair training;Iontophoresis 4mg /ml Dexamethasone;DME Instruction;Gait training;Balance training;Neuromuscular re-education;Manual techniques;Patient/family education;Orthotic Fit/Training;Compression bandaging;Vasopneumatic Device;Taping;Joint Manipulations;Splinting;Energy conservation;Spinal Manipulations;Dry needling;Scar mobilization;Passive range of motion    PT Next Visit Plan  Progress RT knee AROM, and strength as tolerated, with focus on full extension, and gait with LRAD. Add step downs, sidestepping next visit    PT Home Exercise Plan  04/07/19: quad set, heel slide with rope, glute set, heel prop, 04/09/19: ankle pumps    Consulted and Agree with Plan of Care  Patient       Patient will benefit from skilled therapeutic intervention in order to improve the following deficits and impairments:  Abnormal gait, Pain, Decreased mobility, Decreased scar mobility, Decreased activity tolerance, Decreased endurance, Decreased range of motion, Decreased strength, Hypomobility, Decreased balance, Difficulty walking, Increased edema, Impaired flexibility  Visit Diagnosis: Right knee pain, unspecified chronicity  Other abnormalities of gait and mobility  Difficulty in walking, not elsewhere classified     Problem List Patient Active Problem List   Diagnosis Date Noted  . Osteoarthritis of right knee 04/03/2019  . Preventative health care 08/12/2018  . Neck pain 04/29/2015  . Degeneration of cervical intervertebral disc 09/24/2013  . Pain in joint, shoulder region 09/24/2013  . Muscle weakness (generalized) 09/24/2013  . Decreased range of motion of neck 09/24/2013   12:02 PM, 04/28/19 Josue Hector PT DPT  Physical Therapist with [facility]  [phone] [place] [address] [place], [place], [address] Phone: [phone]   Fax:  [phone]  Name: Linda Page MRN: [id number] Date of Birth: 09/14/1950

## 2019-04-30 ENCOUNTER — Encounter (HOSPITAL_COMMUNITY): Payer: Self-pay | Admitting: Physical Therapy

## 2019-04-30 ENCOUNTER — Ambulatory Visit (HOSPITAL_COMMUNITY): Payer: BC Managed Care – PPO | Admitting: Physical Therapy

## 2019-04-30 ENCOUNTER — Other Ambulatory Visit: Payer: Self-pay

## 2019-04-30 DIAGNOSIS — M25561 Pain in right knee: Secondary | ICD-10-CM | POA: Diagnosis not present

## 2019-04-30 DIAGNOSIS — R262 Difficulty in walking, not elsewhere classified: Secondary | ICD-10-CM

## 2019-04-30 DIAGNOSIS — R2689 Other abnormalities of gait and mobility: Secondary | ICD-10-CM

## 2019-04-30 NOTE — Therapy (Signed)
[facility] [place], [place], [address] Phone: [phone]   Fax:  [phone]  Physical Therapy Treatment  Patient Details  Name: Linda Page MRN: [id number] Date of Birth: 1950/08/27 Referring Provider (PT): Rod Can MD    Encounter Date: 04/30/2019  PT End of Session - 04/30/19 1043    Visit Number  8    Number of Visits  18    Date for PT Re-Evaluation  05/23/19    Authorization Type  [facility] PPO, no visit limit, no auth req    Progress Note Due on Visit  10   05/02/19   PT Start Time  1033    PT Stop Time  1115    PT Time Calculation (min)  42 min    Equipment Utilized During Treatment  Gait belt    Activity Tolerance  Patient tolerated treatment well    Behavior During Therapy  [facility] for tasks assessed/performed       Past Medical History:  Diagnosis Date  . Anxiety   . Arthritis    in neck, in knee has gel shots givin in knee once a week for 3 weeks  . Depression   . Hypertension     Past Surgical History:  Procedure Laterality Date  . ANTERIOR CERVICAL DECOMP/DISCECTOMY FUSION N/A 04/29/2015   Procedure: ACDF C4-7;  Surgeon: Melina Schools, MD;  Location: [facility];  Service: Orthopedics;  Laterality: N/A;  . cataract surgery     bilateral  . COLONOSCOPY WITH PROPOFOL N/A 11/05/2018   Procedure: COLONOSCOPY WITH PROPOFOL;  Surgeon: Danie Binder, MD;  [facility];  Service: Endoscopy;  Laterality: N/A;  10:30am  . HAND SURGERY     rt  . KNEE ARTHROPLASTY Right 04/03/2019   Procedure: COMPUTER ASSISTED TOTAL KNEE ARTHROPLASTY;  Surgeon: Rod Can, MD;  [facility];  Service: Orthopedics;  Laterality: Right;  . KNEE SURGERY     rt    There were no vitals filed for this visit.  Subjective Assessment - 04/30/19 1041    Subjective  Patient says she is doing well, still a little sore. Did some work in her yard yesterday and was able to bend down without too much  difficulty.    Currently in Pain?  No/denies                       [facility] Adult PT Treatment/Exercise - 04/30/19 0001      Knee/Hip Exercises: Stretches   Passive Hamstring Stretch  Right;1 rep;30 seconds    Passive Hamstring Stretch Limitations  seated     Knee: Self-Stretch to increase Flexion  Right;5 reps;10 seconds    Knee: Self-Stretch Limitations  on 12 inch box     Gastroc Stretch  Both;3 reps;30 seconds    Gastroc Stretch Limitations  standing slant board      Knee/Hip Exercises: Aerobic   Recumbent Bike  4 minutes lv 3 warmup for mobility       Knee/Hip Exercises: Standing   Heel Raises  Both;20 reps    Terminal Knee Extension  Right;10 reps;Theraband    Theraband Level (Terminal Knee Extension)  Other (comment)    Terminal Knee Extension Limitations  purple 5"     Forward Step Up  Both;1 set;15 reps;Step Height: 6";Hand Hold: 1    Step Down  Both;1 set;15 reps;Hand Hold: 2;Step Height: 4"    Other Standing Knee Exercises  tandem  stance, solid floor, 2 x 30"    Other Standing Knee Exercises  sidestepping blue line 15' 2 RT      Knee/Hip Exercises: Supine   Knee Extension  AROM;Right    Knee Extension Limitations  4    Knee Flexion  AROM;Right    Knee Flexion Limitations  108      Manual Therapy   Manual Therapy  Myofascial release;Passive ROM    Manual therapy comments  Manual treatment performed separate from all other activity    Edema Management  --    Soft tissue mobilization  scar tissue message    Passive ROM  PROM RT knee extension 5 x 30", PROM knee flexion 3 x 15" holds                PT Short Term Goals - 04/07/19 1344      PT SHORT TERM GOAL #1   Title  Patient will be independent with initial HEP to improve functional outcomes    Time  3    Period  Weeks    Status  New    Target Date  05/02/19      PT SHORT TERM GOAL #2   Title  Patient will have RT knee AROM 0-100 degrees to improve functional mobility and facilitate  normal ambulation.    Time  3    Period  Weeks    Status  New    Target Date  05/02/19        PT Long Term Goals - 04/07/19 1345      PT LONG TERM GOAL #1   Title  Patient will have RT knee AROM 0-120 degrees to improve functional mobility and facilitate squatting to pick up items from floor.    Time  6    Period  Weeks    Status  New    Target Date  05/23/19      PT LONG TERM GOAL #2   Title  Patient will improve FOTO score to <30% to indicate improvement in functional outcomes    Time  6    Period  Weeks    Status  New    Target Date  05/23/19      PT LONG TERM GOAL #3   Title  Patient will have equal to or > 4+/5 MMT throughout RLE to improve ability to perform functional mobility, stair ambulation and ADLs.    Time  6    Period  Weeks    Status  New    Target Date  05/23/19      PT LONG TERM GOAL #4   Title  Patient will be able to ambulate at least 350 feet during with LRAD to demonstrate improved ability to perform functional mobility and associated tasks.    Time  6    Period  Weeks    Status  New    Target Date  05/23/19            Plan - 04/30/19 1121    Clinical Impression Statement  Patient doing well with functional activity. Showing improved static balance and good tolerance with added step downs today. Patient required frequent cues for sequencing LEs on steps, and needs use of both hands for stability with descending steps due to ongoing quad weakness. Patient with little AROM progress since last session, but encouraged to continue HEP stretching at least 3 x daily. Added seated hamstring stretching for improving knee extension AROM. Reassess next visit prior  to next MD visit.    Examination-Activity Limitations  Bathing;Lift;Stand;Bed Mobility;Bend;Transfers;Toileting;Locomotion Level;Carry;Sleep;Dressing;Squat;Hygiene/Grooming;Stairs    Examination-Participation Restrictions  Community Activity;Yard Work    Stability/Clinical Decision Making   Stable/Uncomplicated    Rehab Potential  Good    PT Frequency  3x / week    PT Duration  6 weeks    PT Treatment/Interventions  ADLs/Self Care Home Management;Aquatic Therapy;Biofeedback;Cryotherapy;Electrical Stimulation;Therapeutic exercise;Therapeutic activities;Ultrasound;Functional mobility training;Moist Heat;Stair training;Iontophoresis 4mg /ml Dexamethasone;DME Instruction;Gait training;Balance training;Neuromuscular re-education;Manual techniques;Patient/family education;Orthotic Fit/Training;Compression bandaging;Vasopneumatic Device;Taping;Joint Manipulations;Splinting;Energy conservation;Spinal Manipulations;Dry needling;Scar mobilization;Passive range of motion    PT Next Visit Plan  Reassess next visit for MD appt. Add HS strength, try prone hangs for knee extension    PT Home Exercise Plan  04/07/19: quad set, heel slide with rope, glute set, heel prop, 04/09/19: ankle pumps    Consulted and Agree with Plan of Care  Patient       Patient will benefit from skilled therapeutic intervention in order to improve the following deficits and impairments:  Abnormal gait, Pain, Decreased mobility, Decreased scar mobility, Decreased activity tolerance, Decreased endurance, Decreased range of motion, Decreased strength, Hypomobility, Decreased balance, Difficulty walking, Increased edema, Impaired flexibility  Visit Diagnosis: Right knee pain, unspecified chronicity  Other abnormalities of gait and mobility  Difficulty in walking, not elsewhere classified     Problem List Patient Active Problem List   Diagnosis Date Noted  . Osteoarthritis of right knee 04/03/2019  . Preventative health care 08/12/2018  . Neck pain 04/29/2015  . Degeneration of cervical intervertebral disc 09/24/2013  . Pain in joint, shoulder region 09/24/2013  . Muscle weakness (generalized) 09/24/2013  . Decreased range of motion of neck 09/24/2013    11:23 AM, 04/30/19 05/02/19 PT DPT  Physical  Therapist with [facility]  [facility]  [phone]   [facility] [address] [place], [place], [place] Phone: [phone]   Fax:  [phone]  Name: Linda Page MRN: Orlena Sheldon Date of Birth: 05/24/1950

## 2019-05-02 ENCOUNTER — Other Ambulatory Visit: Payer: Self-pay

## 2019-05-02 ENCOUNTER — Encounter (HOSPITAL_COMMUNITY): Payer: Self-pay | Admitting: Physical Therapy

## 2019-05-02 ENCOUNTER — Ambulatory Visit (HOSPITAL_COMMUNITY): Payer: BC Managed Care – PPO | Admitting: Physical Therapy

## 2019-05-02 DIAGNOSIS — M25561 Pain in right knee: Secondary | ICD-10-CM | POA: Diagnosis not present

## 2019-05-02 DIAGNOSIS — R2689 Other abnormalities of gait and mobility: Secondary | ICD-10-CM

## 2019-05-02 DIAGNOSIS — R262 Difficulty in walking, not elsewhere classified: Secondary | ICD-10-CM

## 2019-05-02 NOTE — Therapy (Signed)
[facility] [address] [place], [place], [address] Phone: [phone]   Fax:  [phone]  Physical Therapy Treatment  Patient Details  Name: Linda Page MRN: [id number] Date of Birth: 02/09/1950 Referring Provider (PT): Samson Frederic MD    Encounter Date: 05/02/2019  PT End of Session - 05/02/19 1035    Visit Number  9    Number of Visits  18    Date for PT Re-Evaluation  05/23/19    Authorization Type  [facility] PPO, no visit limit, no auth req    Progress Note Due on Visit  10   05/02/19   PT Start Time  1030    PT Stop Time  1115    PT Time Calculation (min)  45 min    Equipment Utilized During Treatment  Gait belt    Activity Tolerance  Patient tolerated treatment well    Behavior During Therapy  [facility] for tasks assessed/performed       Past Medical History:  Diagnosis Date  . Anxiety   . Arthritis    in neck, in knee has gel shots givin in knee once a week for 3 weeks  . Depression   . Hypertension     Past Surgical History:  Procedure Laterality Date  . ANTERIOR CERVICAL DECOMP/DISCECTOMY FUSION N/A 04/29/2015   Procedure: ACDF C4-7;  Surgeon: Venita Lick, MD;  Location: [facility];  Service: Orthopedics;  Laterality: N/A;  . cataract surgery     bilateral  . COLONOSCOPY WITH PROPOFOL N/A 11/05/2018   Procedure: COLONOSCOPY WITH PROPOFOL;  Surgeon: West Bali, MD;  Location: [facility];  Service: Endoscopy;  Laterality: N/A;  10:30am  . HAND SURGERY     rt  . KNEE ARTHROPLASTY Right 04/03/2019   Procedure: COMPUTER ASSISTED TOTAL KNEE ARTHROPLASTY;  Surgeon: Samson Frederic, MD;  Location: [facility];  Service: Orthopedics;  Laterality: Right;  . KNEE SURGERY     rt    There were no vitals filed for this visit.  Subjective Assessment - 05/02/19 1034    Subjective  Patient reported she is doing well she denied any pain currently.    Currently in Pain?  No/denies                        [facility] Adult PT Treatment/Exercise - 05/02/19 0001      Knee/Hip Exercises: Stretches   Passive Hamstring Stretch  Right;30 seconds;2 reps    Passive Hamstring Stretch Limitations  seated     Knee: Self-Stretch to increase Flexion  Right;10 seconds    Knee: Self-Stretch Limitations  10x on 12 inch box     Gastroc Stretch  Both;3 reps;30 seconds    Gastroc Stretch Limitations  standing slant board      Knee/Hip Exercises: Aerobic   Recumbent Bike  4 minutes lv 3 warmup for mobility       Knee/Hip Exercises: Standing   Knee Flexion  Strengthening;Right;AROM;1 set;15 reps    Terminal Knee Extension  Right;10 reps;Theraband    Theraband Level (Terminal Knee Extension)  Other (comment)    Terminal Knee Extension Limitations  purple 5"     Forward Step Up  Both;1 set;15 reps;Step Height: 6";Hand Hold: 1    Step Down  Both;1 set;15 reps;Hand Hold: 2;Step Height: 4"    Other Standing Knee Exercises  tandem stance, solid floor, 2 x 30"    Other Standing Knee Exercises  sidestepping  blue line 15' 2 RT      Knee/Hip Exercises: Supine   Knee Extension  AROM;Right    Knee Extension Limitations  4    Knee Flexion  Right;AAROM    Knee Flexion Limitations  111      Manual Therapy   Manual Therapy  Myofascial release;Passive ROM    Manual therapy comments  Manual treatment performed separate from all other activity    Soft tissue mobilization  scar tissue massage    Passive ROM  PROM knee flexion 3 x 15" holds                PT Short Term Goals - 04/07/19 1344      PT SHORT TERM GOAL #1   Title  Patient will be independent with initial HEP to improve functional outcomes    Time  3    Period  Weeks    Status  New    Target Date  05/02/19      PT SHORT TERM GOAL #2   Title  Patient will have RT knee AROM 0-100 degrees to improve functional mobility and facilitate normal ambulation.    Time  3    Period  Weeks    Status  New    Target Date   05/02/19        PT Long Term Goals - 04/07/19 1345      PT LONG TERM GOAL #1   Title  Patient will have RT knee AROM 0-120 degrees to improve functional mobility and facilitate squatting to pick up items from floor.    Time  6    Period  Weeks    Status  New    Target Date  05/23/19      PT LONG TERM GOAL #2   Title  Patient will improve FOTO score to <30% to indicate improvement in functional outcomes    Time  6    Period  Weeks    Status  New    Target Date  05/23/19      PT LONG TERM GOAL #3   Title  Patient will have equal to or > 4+/5 MMT throughout RLE to improve ability to perform functional mobility, stair ambulation and ADLs.    Time  6    Period  Weeks    Status  New    Target Date  05/23/19      PT LONG TERM GOAL #4   Title  Patient will be able to ambulate at least 350 feet during with LRAD to demonstrate improved ability to perform functional mobility and associated tasks.    Time  6    Period  Weeks    Status  New    Target Date  05/23/19            Plan - 05/02/19 1121    Clinical Impression Statement  Continued with a focus on ROM this session more on knee flexion than extension. Patient's MD appointment got moved to Tuesday, so held off on the re-assessment until Monday. Added standing knee flexion or hamstring curls for improved strength and mobility of knee flexion. Provided cueing with step-ups on proper sequencing and use of eccentric control when stepping down. Patient's ROM ranged from 4-111 degrees this session with knee flexion being AAROM. Time on the bike was not included in billed time.    Examination-Activity Limitations  Bathing;Lift;Stand;Bed Mobility;Bend;Transfers;Toileting;Locomotion Level;Carry;Sleep;Dressing;Squat;Hygiene/Grooming;Stairs    Examination-Participation Restrictions  Community Activity;Yard Work    Stability/Clinical  Decision Making  Stable/Uncomplicated    Rehab Potential  Good    PT Frequency  3x / week    PT  Duration  6 weeks    PT Treatment/Interventions  ADLs/Self Care Home Management;Aquatic Therapy;Biofeedback;Cryotherapy;Electrical Stimulation;Therapeutic exercise;Therapeutic activities;Ultrasound;Functional mobility training;Moist Heat;Stair training;Iontophoresis 4mg /ml Dexamethasone;DME Instruction;Gait training;Balance training;Neuromuscular re-education;Manual techniques;Patient/family education;Orthotic Fit/Training;Compression bandaging;Vasopneumatic Device;Taping;Joint Manipulations;Splinting;Energy conservation;Spinal Manipulations;Dry needling;Scar mobilization;Passive range of motion    PT Next Visit Plan  Reassess next visit for MD appt and 10th visit progress note (patient changed date of MD appointment). try prone hangs for knee extension    PT Home Exercise Plan  04/07/19: quad set, heel slide with rope, glute set, heel prop, 04/09/19: ankle pumps    Consulted and Agree with Plan of Care  Patient       Patient will benefit from skilled therapeutic intervention in order to improve the following deficits and impairments:  Abnormal gait, Pain, Decreased mobility, Decreased scar mobility, Decreased activity tolerance, Decreased endurance, Decreased range of motion, Decreased strength, Hypomobility, Decreased balance, Difficulty walking, Increased edema, Impaired flexibility  Visit Diagnosis: Right knee pain, unspecified chronicity  Other abnormalities of gait and mobility  Difficulty in walking, not elsewhere classified     Problem List Patient Active Problem List   Diagnosis Date Noted  . Osteoarthritis of right knee 04/03/2019  . Preventative health care 08/12/2018  . Neck pain 04/29/2015  . Degeneration of cervical intervertebral disc 09/24/2013  . Pain in joint, shoulder region 09/24/2013  . Muscle weakness (generalized) 09/24/2013  . Decreased range of motion of neck 09/24/2013   Clarene Critchley PT, DPT 11:23 AM, 05/02/19 [place], [place], [address] Phone: [phone]   Fax:  [phone]  Name: Linda Page MRN: [id number] Date of Birth: [birth year]

## 2019-05-05 ENCOUNTER — Other Ambulatory Visit: Payer: Self-pay

## 2019-05-05 ENCOUNTER — Encounter (HOSPITAL_COMMUNITY): Payer: Self-pay | Admitting: Physical Therapy

## 2019-05-05 ENCOUNTER — Ambulatory Visit (HOSPITAL_COMMUNITY): Payer: BC Managed Care – PPO | Admitting: Physical Therapy

## 2019-05-05 DIAGNOSIS — M25561 Pain in right knee: Secondary | ICD-10-CM | POA: Diagnosis not present

## 2019-05-05 DIAGNOSIS — R2689 Other abnormalities of gait and mobility: Secondary | ICD-10-CM

## 2019-05-05 DIAGNOSIS — R262 Difficulty in walking, not elsewhere classified: Secondary | ICD-10-CM

## 2019-05-05 NOTE — Therapy (Addendum)
[place], [place], [address] Phone: [phone]   Fax:  [phone]  Physical Therapy Treatment/ Progress Note  Patient Details  Name: Linda Page MRN: [id number] Date of Birth: 10/10/1950 Referring Provider (PT): Rod Can MD    Encounter Date: 05/05/2019  Progress Note Reporting Period 04/07/19 to 05/05/19  See note below for Objective Data and Assessment of Progress/Goals.      PT End of Session - 05/05/19 1040    Visit Number  10    Number of Visits  18    Date for PT Re-Evaluation  05/23/19    Authorization Type  [facility] PPO, no visit limit, no auth req    Progress Note Due on Visit  18    PT Start Time  1030    PT Stop Time  1115    PT Time Calculation (min)  45 min    Equipment Utilized During Treatment  Gait belt    Activity Tolerance  Patient tolerated treatment well    Behavior During Therapy  WFL for tasks assessed/performed       Past Medical History:  Diagnosis Date  . Anxiety   . Arthritis    in neck, in knee has gel shots givin in knee once a week for 3 weeks  . Depression   . Hypertension     Past Surgical History:  Procedure Laterality Date  . ANTERIOR CERVICAL DECOMP/DISCECTOMY FUSION N/A 04/29/2015   Procedure: ACDF C4-7;  Surgeon: Melina Schools, MD;  Location: [place];  Service: Orthopedics;  Laterality: N/A;  . cataract surgery     bilateral  . COLONOSCOPY WITH PROPOFOL N/A 11/05/2018   Procedure: COLONOSCOPY WITH PROPOFOL;  Surgeon: Danie Binder, MD;  [facility];  Service: Endoscopy;  Laterality: N/A;  10:30am  . HAND SURGERY     rt  . KNEE ARTHROPLASTY Right 04/03/2019   Procedure: COMPUTER ASSISTED TOTAL KNEE ARTHROPLASTY;  Surgeon: Rod Can, MD;  Location: [facility];  Service: Orthopedics;  Laterality: Right;  . KNEE SURGERY     rt    There were no vitals filed for this visit.  Subjective Assessment - 05/05/19 1033    Subjective   Patient says she is doing well and feels almost 90% improved since starting therapy. Says she still has stiffness and swelling that continues to bother her. Says she is walking pretty good, but is still very careful walking in certain places and outside.    Limitations  Sitting;Lifting;Standing;Walking;House hold activities    How long can you stand comfortably?  no limit    How long can you walk comfortably?  walk as far as wants in home, hasn't tried outside much    Patient Stated Goals  Get to point where I am healing again    Currently in Pain?  No/denies    Aggravating Factors   standing, walking, bending    Pain Relieving Factors  rest, ice, meds    Effect of Pain on Daily Activities  Limits         [facility] PT Assessment - 05/05/19 0001      Assessment   Medical Diagnosis  RT TKA     Referring Provider (PT)  Rod Can MD     Onset Date/Surgical Date  04/03/19    Next MD Visit  05/06/19    Prior Therapy  Yes       Precautions   Precautions  None  Restrictions   Weight Bearing Restrictions  No      Balance Screen   Has the patient fallen in the past 6 months  No    Has the patient had a decrease in activity level because of a fear of falling?   No    Is the patient reluctant to leave their home because of a fear of falling?   No      Home Film/video editor residence      Prior Function   Level of Independence  Independent      Cognition   Overall Cognitive Status  Within Functional Limits for tasks assessed      Observation/Other Assessments   Observations  incision is intact and appears to be well healed     Focus on Therapeutic Outcomes (FOTO)   24% limited   was 48%     Sensation   Light Touch  Appears Intact      AROM   Right Knee Extension  4    Right Knee Flexion  115      Strength   Right Hip Flexion  4+/5   was 3-   Right Hip Extension  4/5   was 4-   Right Hip ABduction  4/5   was 3+   Left Hip Flexion  5/5     Left Hip Extension  4+/5    Left Hip ABduction  4+/5    Right Knee Flexion  4+/5   was 4   Right Knee Extension  4/5   was 3-   Left Knee Flexion  5/5    Left Knee Extension  5/5      Palpation   Palpation comment  Min TTP about lateral RT knee joint      Ambulation/Gait   Ambulation/Gait  Yes    Ambulation/Gait Assistance  7: Independent    Ambulation Distance (Feet)  400 Feet    Assistive device  None    Gait Pattern  Decreased step length - right;Decreased stride length;Decreased hip/knee flexion - right    Ambulation Surface  Level;Indoor    Gait Comments  2MWT                   [facility] Adult PT Treatment/Exercise - 05/05/19 0001      Knee/Hip Exercises: Aerobic   Recumbent Bike  4 minutes lv 3 warmup for mobility       Manual Therapy   Manual Therapy  Passive ROM    Manual therapy comments  Manual treatment performed separate from all other activity    Passive ROM  PROM knee flexion 3 x 15" holds; knee extension 5 x10" holds               PT Education - 05/05/19 1040    Education Details  on reassessment findings, and POC    Person(s) Educated  Patient    Methods  Explanation    Comprehension  Verbalized understanding       PT Short Term Goals - 05/05/19 1116      PT SHORT TERM GOAL #1   Title  Patient will be independent with initial HEP to improve functional outcomes    Baseline  Reports compliance    Time  3    Period  Weeks    Status  Achieved    Target Date  05/02/19      PT SHORT TERM GOAL #2   Title  Patient will have  RT knee AROM 0-100 degrees to improve functional mobility and facilitate normal ambulation.    Baseline  4-115    Time  3    Period  Weeks    Status  Partially Met    Target Date  05/02/19        PT Long Term Goals - 05/05/19 1116      PT LONG TERM GOAL #1   Title  Patient will have RT knee AROM 0-120 degrees to improve functional mobility and facilitate squatting to pick up items from floor.    Baseline   4-115    Time  6    Period  Weeks    Status  On-going      PT LONG TERM GOAL #2   Title  Patient will improve FOTO score to <30% to indicate improvement in functional outcomes    Baseline  24% limited    Time  6    Period  Weeks    Status  Achieved      PT LONG TERM GOAL #3   Title  Patient will have equal to or > 4+/5 MMT throughout RLE to improve ability to perform functional mobility, stair ambulation and ADLs.    Baseline  See MMT    Time  6    Period  Weeks    Status  Partially Met      PT LONG TERM GOAL #4   Title  Patient will be able to ambulate at least 350 feet during 2MWT with LRAD to demonstrate improved ability to perform functional mobility and associated tasks.    Baseline  400 feet with no AD    Time  6    Period  Weeks    Status  Achieved            Plan - 05/05/19 1319    Clinical Impression Statement  Patient is making good progress toward therapy goals. Patient currently with  short term and 2/4 long term goals met. Patient continues to be limited by mild knee AROM limitations, LE weakness, balance and gait abnormalities which continue to negatively impact function. Patient will continue to benefit from skilled therapy services to address these remaining deficits to reduce pain, improve LOF with ADLs and functional mobility and reduce risk for future falls.    Examination-Activity Limitations  Bathing;Lift;Stand;Bed Mobility;Bend;Transfers;Toileting;Locomotion Level;Carry;Sleep;Dressing;Squat;Hygiene/Grooming;Stairs    Examination-Participation Restrictions  Community Activity;Yard Work    Stability/Clinical Decision Making  Stable/Uncomplicated    Rehab Potential  Good    PT Frequency  3x / week    PT Duration  6 weeks    PT Treatment/Interventions  ADLs/Self Care Home Management;Aquatic Therapy;Biofeedback;Cryotherapy;Electrical Stimulation;Therapeutic exercise;Therapeutic activities;Ultrasound;Functional mobility training;Moist Heat;Stair  training;Iontophoresis '4mg'$ /ml Dexamethasone;DME Instruction;Gait training;Balance training;Neuromuscular re-education;Manual techniques;Patient/family education;Orthotic Fit/Training;Compression bandaging;Vasopneumatic Device;Taping;Joint Manipulations;Splinting;Energy conservation;Spinal Manipulations;Dry needling;Scar mobilization;Passive range of motion    PT Next Visit Plan  Continue to progress activity as tolerated with focus on gait, balance, and functional LE strengthening. Try prone hangs for knee extension next visit    PT Home Exercise Plan  04/07/19: quad set, heel slide with rope, glute set, heel prop, 04/09/19: ankle pumps    Consulted and Agree with Plan of Care  Patient       Patient will benefit from skilled therapeutic intervention in order to improve the following deficits and impairments:  Abnormal gait, Pain, Decreased mobility, Decreased scar mobility, Decreased activity tolerance, Decreased endurance, Decreased range of motion, Decreased strength, Hypomobility, Decreased balance, Difficulty walking, Increased edema, Impaired flexibility  Visit  Diagnosis: Right knee pain, unspecified chronicity  Other abnormalities of gait and mobility  Difficulty in walking, not elsewhere classified     Problem List Patient Active Problem List   Diagnosis Date Noted  . Osteoarthritis of right knee 04/03/2019  . Preventative health care 08/12/2018  . Neck pain 04/29/2015  . Degeneration of cervical intervertebral disc 09/24/2013  . Pain in joint, shoulder region 09/24/2013  . Muscle weakness (generalized) 09/24/2013  . Decreased range of motion of neck 09/24/2013    1:28 PM, 05/05/19 Josue Hector PT DPT  Physical Therapist with [facility]  [phone] [place] [address] [place], [place], [address] Phone: [phone]   Fax:  [phone]  Name: Linda Page MRN: [id number] Date of Birth:  04-Apr-1950

## 2019-05-07 ENCOUNTER — Ambulatory Visit (HOSPITAL_COMMUNITY): Payer: BC Managed Care – PPO | Admitting: Physical Therapy

## 2019-05-07 ENCOUNTER — Other Ambulatory Visit: Payer: Self-pay

## 2019-05-07 ENCOUNTER — Encounter (HOSPITAL_COMMUNITY): Payer: Self-pay | Admitting: Physical Therapy

## 2019-05-07 DIAGNOSIS — M25561 Pain in right knee: Secondary | ICD-10-CM

## 2019-05-07 DIAGNOSIS — R262 Difficulty in walking, not elsewhere classified: Secondary | ICD-10-CM

## 2019-05-07 DIAGNOSIS — R2689 Other abnormalities of gait and mobility: Secondary | ICD-10-CM

## 2019-05-07 NOTE — Therapy (Signed)
[place], [place], [address] Phone: [phone]   Fax:  [phone]  Physical Therapy Treatment  Patient Details  Name: Linda Page MRN: [id number] Date of Birth: 04-27-1950 Referring Provider (PT): Rod Can MD    Encounter Date: 05/07/2019  PT End of Session - 05/07/19 1044    Visit Number  11    Number of Visits  18    Date for PT Re-Evaluation  05/23/19    Authorization Type  [employer], no visit limit, no auth req    Progress Note Due on Visit  18    PT Start Time  1030    PT Stop Time  1113    PT Time Calculation (min)  43 min    Equipment Utilized During Treatment  --    Activity Tolerance  Patient tolerated treatment well    Behavior During Therapy  [facility] for tasks assessed/performed       Past Medical History:  Diagnosis Date  . Anxiety   . Arthritis    in neck, in knee has gel shots givin in knee once a week for 3 weeks  . Depression   . Hypertension     Past Surgical History:  Procedure Laterality Date  . ANTERIOR CERVICAL DECOMP/DISCECTOMY FUSION N/A 04/29/2015   Procedure: ACDF C4-7;  Surgeon: Melina Schools, MD;  Location: [place];  Service: Orthopedics;  Laterality: N/A;  . cataract surgery     bilateral  . COLONOSCOPY WITH PROPOFOL N/A 11/05/2018   Procedure: COLONOSCOPY WITH PROPOFOL;  Surgeon: Danie Binder, MD;  [facility];  Service: Endoscopy;  Laterality: N/A;  10:30am  . HAND SURGERY     rt  . KNEE ARTHROPLASTY Right 04/03/2019   Procedure: COMPUTER ASSISTED TOTAL KNEE ARTHROPLASTY;  Surgeon: Rod Can, MD;  Location: [facility];  Service: Orthopedics;  Laterality: Right;  . KNEE SURGERY     rt    There were no vitals filed for this visit.  Subjective Assessment - 05/07/19 1042    Subjective  Patient says she had f/u with MD yesterday and it went well, says he is pleased with her progress. Patient says she was able to walk down stairs  yesterday with less pain and better form. Reports no pain currently. Also noted MD has cleared her to drive.    Limitations  Sitting;Lifting;Standing;Walking;House hold activities    How long can you stand comfortably?  no limit    How long can you walk comfortably?  walk as far as wants in home, hasn't tried outside much    Patient Stated Goals  Get to point where I am healing again    Currently in Pain?  No/denies                       [facility] Adult PT Treatment/Exercise - 05/07/19 0001      Knee/Hip Exercises: Stretches   Knee: Self-Stretch to increase Flexion  Right;10 seconds    Knee: Self-Stretch Limitations  10x on 12 inch box     Gastroc Stretch  Both;3 reps;30 seconds    Gastroc Stretch Limitations  standing slant board      Knee/Hip Exercises: Aerobic   Recumbent Bike  4 minutes lv 3 warmup for mobility       Knee/Hip Exercises: Machines for Strengthening   Cybex Leg Press  30# 2 x 10      Knee/Hip Exercises: Standing  Heel Raises  Both;20 reps    Knee Flexion  Right;2 sets;10 reps;Other (comment)    Knee Flexion Limitations  2#    Hip Abduction  Both;2 sets;10 reps;Other (comment)    Abduction Limitations  2#    Hip Extension  Both;2 sets;10 reps    Extension Limitations  2#    Functional Squat  2 sets;10 reps    Stairs  up 7 in down 4 in 3RT single hand rail reciprocal gait     Other Standing Knee Exercises  tandem stance, on foam, 2 x 30"      Knee/Hip Exercises: Supine   Heel Slides  Right;5 reps    Heel Slides Limitations  5" with strap     Knee Extension  AROM;Right    Knee Extension Limitations  4    Knee Flexion  Right;AROM    Knee Flexion Limitations  114      Manual Therapy   Manual Therapy  Passive ROM    Manual therapy comments  Manual treatment performed separate from all other activity    Passive ROM  PROM knee flexion 3 x 15" holds; knee extension 5 x10" holds                 PT Short Term Goals - 05/05/19 1116      PT  SHORT TERM GOAL #1   Title  Patient will be independent with initial HEP to improve functional outcomes    Baseline  Reports compliance    Time  3    Period  Weeks    Status  Achieved    Target Date  05/02/19      PT SHORT TERM GOAL #2   Title  Patient will have RT knee AROM 0-100 degrees to improve functional mobility and facilitate normal ambulation.    Baseline  4-115    Time  3    Period  Weeks    Status  Partially Met    Target Date  05/02/19        PT Long Term Goals - 05/05/19 1116      PT LONG TERM GOAL #1   Title  Patient will have RT knee AROM 0-120 degrees to improve functional mobility and facilitate squatting to pick up items from floor.    Baseline  4-115    Time  6    Period  Weeks    Status  On-going      PT LONG TERM GOAL #2   Title  Patient will improve FOTO score to <30% to indicate improvement in functional outcomes    Baseline  24% limited    Time  6    Period  Weeks    Status  Achieved      PT LONG TERM GOAL #3   Title  Patient will have equal to or > 4+/5 MMT throughout RLE to improve ability to perform functional mobility, stair ambulation and ADLs.    Baseline  See MMT    Time  6    Period  Weeks    Status  Partially Met      PT LONG TERM GOAL #4   Title  Patient will be able to ambulate at least 350 feet during 2MWT with LRAD to demonstrate improved ability to perform functional mobility and associated tasks.    Baseline  400 feet with no AD    Time  6    Period  Weeks    Status  Achieved  Plan - 05/07/19 1657    Clinical Impression Statement  Patient able to progress LE strengthening today with no increased complaint of pain. Patient cued on proper form and mechanics with added machine leg press and standing weighted HS curls. Patient still with mild AROM limitations which continue to impact function. Patient still with decreased stability descending stairs in reciprocal pattern due to RLE weakness. Will continue to benefit  from skilled therapy service to progress LE strength for improved functional mobility and stair ambulation.    Examination-Activity Limitations  Bathing;Lift;Stand;Bed Mobility;Bend;Transfers;Toileting;Locomotion Level;Carry;Sleep;Dressing;Squat;Hygiene/Grooming;Stairs    Examination-Participation Restrictions  Community Activity;Yard Work    Stability/Clinical Decision Making  Stable/Uncomplicated    Rehab Potential  Good    PT Frequency  3x / week    PT Duration  6 weeks    PT Treatment/Interventions  ADLs/Self Care Home Management;Aquatic Therapy;Biofeedback;Cryotherapy;Electrical Stimulation;Therapeutic exercise;Therapeutic activities;Ultrasound;Functional mobility training;Moist Heat;Stair training;Iontophoresis '4mg'$ /ml Dexamethasone;DME Instruction;Gait training;Balance training;Neuromuscular re-education;Manual techniques;Patient/family education;Orthotic Fit/Training;Compression bandaging;Vasopneumatic Device;Taping;Joint Manipulations;Splinting;Energy conservation;Spinal Manipulations;Dry needling;Scar mobilization;Passive range of motion    PT Next Visit Plan  Continue to progress activity as tolerated with focus on gait, balance, and functional LE strengthening. Try prone hangs for knee extension, and step down from 6 inch box next visit    PT Home Exercise Plan  04/07/19: quad set, heel slide with rope, glute set, heel prop, 04/09/19: ankle pumps    Consulted and Agree with Plan of Care  Patient       Patient will benefit from skilled therapeutic intervention in order to improve the following deficits and impairments:  Abnormal gait, Pain, Decreased mobility, Decreased scar mobility, Decreased activity tolerance, Decreased endurance, Decreased range of motion, Decreased strength, Hypomobility, Decreased balance, Difficulty walking, Increased edema, Impaired flexibility  Visit Diagnosis: Right knee pain, unspecified chronicity  Other abnormalities of gait and mobility  Difficulty in  walking, not elsewhere classified     Problem List Patient Active Problem List   Diagnosis Date Noted  . Osteoarthritis of right knee 04/03/2019  . Preventative health care 08/12/2018  . Neck pain 04/29/2015  . Degeneration of cervical intervertebral disc 09/24/2013  . Pain in joint, shoulder region 09/24/2013  . Muscle weakness (generalized) 09/24/2013  . Decreased range of motion of neck 09/24/2013    5:04 PM, 05/07/19 Josue Hector PT DPT  Physical Therapist with [facility]  [phone] [place] [address] [place], [place], [address] Phone: [phone]   Fax:  [phone]  Name: Linda Page MRN: [id number] Date of Birth: 1950-08-06

## 2019-05-09 ENCOUNTER — Other Ambulatory Visit: Payer: Self-pay

## 2019-05-09 ENCOUNTER — Ambulatory Visit (HOSPITAL_COMMUNITY): Payer: BC Managed Care – PPO | Admitting: Physical Therapy

## 2019-05-09 ENCOUNTER — Encounter (HOSPITAL_COMMUNITY): Payer: Self-pay | Admitting: Physical Therapy

## 2019-05-09 DIAGNOSIS — M25561 Pain in right knee: Secondary | ICD-10-CM

## 2019-05-09 DIAGNOSIS — R2689 Other abnormalities of gait and mobility: Secondary | ICD-10-CM

## 2019-05-09 DIAGNOSIS — R262 Difficulty in walking, not elsewhere classified: Secondary | ICD-10-CM

## 2019-05-09 NOTE — Therapy (Signed)
[place], [place], [address] Phone: [phone]   Fax:  [phone]  Physical Therapy Treatment  Patient Details  Name: Linda Page MRN: [id number] Date of Birth: 24-Mar-1950 Referring Provider (PT): Rod Can MD    Encounter Date: 05/09/2019  PT End of Session - 05/09/19 1034    Visit Number  12    Number of Visits  18    Date for PT Re-Evaluation  05/23/19    Authorization Type  [employer], no visit limit, no auth req    Progress Note Due on Visit  18    PT Start Time  1030    PT Stop Time  1112    PT Time Calculation (min)  42 min    Activity Tolerance  Patient tolerated treatment well    Behavior During Therapy  [facility] for tasks assessed/performed       Past Medical History:  Diagnosis Date  . Anxiety   . Arthritis    in neck, in knee has gel shots givin in knee once a week for 3 weeks  . Depression   . Hypertension     Past Surgical History:  Procedure Laterality Date  . ANTERIOR CERVICAL DECOMP/DISCECTOMY FUSION N/A 04/29/2015   Procedure: ACDF C4-7;  Surgeon: Melina Schools, MD;  Location: [place];  Service: Orthopedics;  Laterality: N/A;  . cataract surgery     bilateral  . COLONOSCOPY WITH PROPOFOL N/A 11/05/2018   Procedure: COLONOSCOPY WITH PROPOFOL;  Surgeon: Danie Binder, MD;  [facility];  Service: Endoscopy;  Laterality: N/A;  10:30am  . HAND SURGERY     rt  . KNEE ARTHROPLASTY Right 04/03/2019   Procedure: COMPUTER ASSISTED TOTAL KNEE ARTHROPLASTY;  Surgeon: Rod Can, MD;  Location[facility];  Service: Orthopedics;  Laterality: Right;  . KNEE SURGERY     rt    There were no vitals filed for this visit.  Subjective Assessment - 05/09/19 1033    Subjective  Patient reported that she is doing okay today. She denied any pain currently.    Limitations  Sitting;Lifting;Standing;Walking;House hold activities    How long can you stand comfortably?  no  limit    How long can you walk comfortably?  walk as far as wants in home, hasn't tried outside much    Patient Stated Goals  Get to point where I am healing again    Currently in Pain?  No/denies                       [facility] Adult PT Treatment/Exercise - 05/09/19 0001      Knee/Hip Exercises: Stretches   Knee: Self-Stretch to increase Flexion  Right;10 seconds    Knee: Self-Stretch Limitations  10x on 12 inch box     Gastroc Stretch  Both;3 reps;30 seconds    Gastroc Stretch Limitations  standing slant board      Knee/Hip Exercises: Aerobic   Recumbent Bike  4 minutes lv 3 warmup for mobility       Knee/Hip Exercises: Standing   Heel Raises  Both;20 reps    Knee Flexion  Right;2 sets;10 reps;Other (comment)    Knee Flexion Limitations  2#    Hip Abduction  Both;2 sets;10 reps;Other (comment)    Abduction Limitations  2#    Hip Extension  Both;2 sets;10 reps    Extension Limitations  2#    Step Down  1 set;Right;10 reps;Step Height: 6";Hand Hold: 2    Functional Squat  2 sets;10 reps    Other Standing Knee Exercises  tandem stance, on foam, 2 x 30"      Knee/Hip Exercises: Supine   Heel Slides  Right;5 reps    Heel Slides Limitations  5" with strap     Knee Extension  AROM;Right    Knee Extension Limitations  4    Knee Flexion  Right;AROM    Knee Flexion Limitations  118      Knee/Hip Exercises: Prone   Prone Knee Hang  3 minutes    Prone Knee Hang Weights (lbs)  2#      Manual Therapy   Manual Therapy  Passive ROM    Manual therapy comments  Manual treatment performed separate from all other activity    Passive ROM  PROM knee flexion 3 x 15" holds; knee extension 5 x10" holds                 PT Short Term Goals - 05/05/19 1116      PT SHORT TERM GOAL #1   Title  Patient will be independent with initial HEP to improve functional outcomes    Baseline  Reports compliance    Time  3    Period  Weeks    Status  Achieved    Target Date   05/02/19      PT SHORT TERM GOAL #2   Title  Patient will have RT knee AROM 0-100 degrees to improve functional mobility and facilitate normal ambulation.    Baseline  4-115    Time  3    Period  Weeks    Status  Partially Met    Target Date  05/02/19        PT Long Term Goals - 05/05/19 1116      PT LONG TERM GOAL #1   Title  Patient will have RT knee AROM 0-120 degrees to improve functional mobility and facilitate squatting to pick up items from floor.    Baseline  4-115    Time  6    Period  Weeks    Status  On-going      PT LONG TERM GOAL #2   Title  Patient will improve FOTO score to <30% to indicate improvement in functional outcomes    Baseline  24% limited    Time  6    Period  Weeks    Status  Achieved      PT LONG TERM GOAL #3   Title  Patient will have equal to or > 4+/5 MMT throughout RLE to improve ability to perform functional mobility, stair ambulation and ADLs.    Baseline  See MMT    Time  6    Period  Weeks    Status  Partially Met      PT LONG TERM GOAL #4   Title  Patient will be able to ambulate at least 350 feet during 2MWT with LRAD to demonstrate improved ability to perform functional mobility and associated tasks.    Baseline  400 feet with no AD    Time  6    Period  Weeks    Status  Achieved            Plan - 05/09/19 1114    Clinical Impression Statement  Focused on strengthening this session, but continued to work on ROM as well . Patient reported some soreness following using the  leg press last session, so discontinued this session. Added prone knee hangs as well as 6'' step-downs this session.  Patient tolerated these well with minimal cueing for form with the step downs. Patient's knee flexion AROM increased to 118 this session.    Examination-Activity Limitations  Bathing;Lift;Stand;Bed Mobility;Bend;Transfers;Toileting;Locomotion Level;Carry;Sleep;Dressing;Squat;Hygiene/Grooming;Stairs    Examination-Participation Restrictions   Community Activity;Yard Work    Stability/Clinical Decision Making  Stable/Uncomplicated    Rehab Potential  Good    PT Frequency  3x / week    PT Duration  6 weeks    PT Treatment/Interventions  ADLs/Self Care Home Management;Aquatic Therapy;Biofeedback;Cryotherapy;Electrical Stimulation;Therapeutic exercise;Therapeutic activities;Ultrasound;Functional mobility training;Moist Heat;Stair training;Iontophoresis 41m/ml Dexamethasone;DME Instruction;Gait training;Balance training;Neuromuscular re-education;Manual techniques;Patient/family education;Orthotic Fit/Training;Compression bandaging;Vasopneumatic Device;Taping;Joint Manipulations;Splinting;Energy conservation;Spinal Manipulations;Dry needling;Scar mobilization;Passive range of motion    PT Next Visit Plan  Continue to progress activity as tolerated with focus on gait, balance, and functional LE strengthening. Retro walking next session    PT Home Exercise Plan  04/07/19: quad set, heel slide with rope, glute set, heel prop, 04/09/19: ankle pumps    Consulted and Agree with Plan of Care  Patient       Patient will benefit from skilled therapeutic intervention in order to improve the following deficits and impairments:  Abnormal gait, Pain, Decreased mobility, Decreased scar mobility, Decreased activity tolerance, Decreased endurance, Decreased range of motion, Decreased strength, Hypomobility, Decreased balance, Difficulty walking, Increased edema, Impaired flexibility  Visit Diagnosis: Right knee pain, unspecified chronicity  Other abnormalities of gait and mobility  Difficulty in walking, not elsewhere classified     Problem List Patient Active Problem List   Diagnosis Date Noted  . Osteoarthritis of right knee 04/03/2019  . Preventative health care 08/12/2018  . Neck pain 04/29/2015  . Degeneration of cervical intervertebral disc 09/24/2013  . Pain in joint, shoulder region 09/24/2013  . Muscle weakness (generalized) 09/24/2013   . Decreased range of motion of neck 09/24/2013   MClarene CritchleyPT, DPT 11:16 AM, 05/09/19 [address] [address]Phone: [phone]  Fax:  [phone] Name: Linda KERCHNERMRN: [id number] of Birth: 106-12-1950

## 2019-05-12 ENCOUNTER — Ambulatory Visit (HOSPITAL_COMMUNITY): Payer: BC Managed Care – PPO | Attending: Orthopedic Surgery | Admitting: Physical Therapy

## 2019-05-12 ENCOUNTER — Encounter (HOSPITAL_COMMUNITY): Payer: Self-pay | Admitting: Physical Therapy

## 2019-05-12 ENCOUNTER — Other Ambulatory Visit: Payer: Self-pay

## 2019-05-12 DIAGNOSIS — M25561 Pain in right knee: Secondary | ICD-10-CM | POA: Insufficient documentation

## 2019-05-12 DIAGNOSIS — R2689 Other abnormalities of gait and mobility: Secondary | ICD-10-CM | POA: Insufficient documentation

## 2019-05-12 DIAGNOSIS — R262 Difficulty in walking, not elsewhere classified: Secondary | ICD-10-CM | POA: Insufficient documentation

## 2019-05-12 NOTE — Therapy (Signed)
[place], [place], [address] Phone: [phone]   Fax:  [phone]  Physical Therapy Treatment  Patient Details  Name: GLORIE DOWLEN MRN: [id number] Date of Birth: May 20, 1950 Referring Provider (PT): Rod Can MD    Encounter Date: 05/12/2019  PT End of Session - 05/12/19 1043    Visit Number  13    Number of Visits  18    Date for PT Re-Evaluation  05/23/19    Authorization Type  [employer], no visit limit, no auth req    Progress Note Due on Visit  18    PT Start Time  1035    PT Stop Time  1120    PT Time Calculation (min)  45 min    Activity Tolerance  Patient tolerated treatment well    Behavior During Therapy  [facility] for tasks assessed/performed       Past Medical History:  Diagnosis Date  . Anxiety   . Arthritis    in neck, in knee has gel shots givin in knee once a week for 3 weeks  . Depression   . Hypertension     Past Surgical History:  Procedure Laterality Date  . ANTERIOR CERVICAL DECOMP/DISCECTOMY FUSION N/A 04/29/2015   Procedure: ACDF C4-7;  Surgeon: Melina Schools, MD;  Location: [place];  Service: Orthopedics;  Laterality: N/A;  . cataract surgery     bilateral  . COLONOSCOPY WITH PROPOFOL N/A 11/05/2018   Procedure: COLONOSCOPY WITH PROPOFOL;  Surgeon: Danie Binder, MD;  [facility];  Service: Endoscopy;  Laterality: N/A;  10:30am  . HAND SURGERY     rt  . KNEE ARTHROPLASTY Right 04/03/2019   Procedure: COMPUTER ASSISTED TOTAL KNEE ARTHROPLASTY;  Surgeon: Rod Can, MD;  [facility];  Service: Orthopedics;  Laterality: Right;  . KNEE SURGERY     rt    There were no vitals filed for this visit.  Subjective Assessment - 05/12/19 1041    Subjective  Patient reports no new issues. Says she is getting around better, and can tell that swelling has gone down. Reports no pain currently "just tight"    Limitations   Sitting;Lifting;Standing;Walking;House hold activities    How long can you stand comfortably?  no limit    How long can you walk comfortably?  walk as far as wants in home, hasn't tried outside much    Patient Stated Goals  Get to point where I am healing again    Currently in Pain?  No/denies                       [facility] Adult PT Treatment/Exercise - 05/12/19 0001      Knee/Hip Exercises: Stretches   Quad Stretch  Right;3 reps;30 seconds    Quad Stretch Limitations  AAROM in prone    Press photographer  Both;3 reps;30 seconds    Gastroc Stretch Limitations  standing slant board      Knee/Hip Exercises: Aerobic   Recumbent Bike  4 minutes lv 3 warmup for mobility       Knee/Hip Exercises: Machines for Strengthening   Cybex Leg Press  30# 2 x 10    Other Machine  machine walk out 30# x10       Knee/Hip Exercises: Standing   Heel Raises  Both;20 reps    Knee Flexion  Right;2 sets;10 reps;Other (comment)    Knee Flexion Limitations  2#  Hip Abduction  Both;2 sets;10 reps;Other (comment)    Abduction Limitations  2#    Hip Extension  Both;2 sets;10 reps    Extension Limitations  2#    Step Down  1 set;Right;10 reps;Step Height: 6";Hand Hold: 1    Functional Squat  2 sets;10 reps    Functional Squat Limitations  in front of mirror to avoid LT lean     SLS  3 x10" each on solid floor     Gait Training  226' no AD, cues for heel/ toe transition       Knee/Hip Exercises: Supine   Heel Slides  Right;5 reps    Heel Slides Limitations  5" with strap     Knee Extension  AROM;Right    Knee Extension Limitations  3    Knee Flexion  Right;AROM    Knee Flexion Limitations  117      Knee/Hip Exercises: Prone   Prone Knee Hang  3 minutes    Prone Knee Hang Weights (lbs)  2#               PT Short Term Goals - 05/05/19 1116      PT SHORT TERM GOAL #1   Title  Patient will be independent with initial HEP to improve functional outcomes    Baseline  Reports  compliance    Time  3    Period  Weeks    Status  Achieved    Target Date  05/02/19      PT SHORT TERM GOAL #2   Title  Patient will have RT knee AROM 0-100 degrees to improve functional mobility and facilitate normal ambulation.    Baseline  4-115    Time  3    Period  Weeks    Status  Partially Met    Target Date  05/02/19        PT Long Term Goals - 05/05/19 1116      PT LONG TERM GOAL #1   Title  Patient will have RT knee AROM 0-120 degrees to improve functional mobility and facilitate squatting to pick up items from floor.    Baseline  4-115    Time  6    Period  Weeks    Status  On-going      PT LONG TERM GOAL #2   Title  Patient will improve FOTO score to <30% to indicate improvement in functional outcomes    Baseline  24% limited    Time  6    Period  Weeks    Status  Achieved      PT LONG TERM GOAL #3   Title  Patient will have equal to or > 4+/5 MMT throughout RLE to improve ability to perform functional mobility, stair ambulation and ADLs.    Baseline  See MMT    Time  6    Period  Weeks    Status  Partially Met      PT LONG TERM GOAL #4   Title  Patient will be able to ambulate at least 350 feet during 2MWT with LRAD to demonstrate improved ability to perform functional mobility and associated tasks.    Baseline  400 feet with no AD    Time  6    Period  Weeks    Status  Achieved            Plan - 05/12/19 1332    Clinical Impression Statement  Patient demos heel rise with squatting  today and requires verbal cues for maintaining even weight shift. Used mirror for visual feedback with this. Assessed patient RT ankle mobility and found DF limitation. Patient instructed to increase frequency of RT ankle DF mobility and calf stretching in HEP. Added weighted walkouts and increased weight with leg press today. Patient educated on proper form and function of added exercise. Patient able to progress to single hand hold with step down from 6 inch box but  requires cues for controlled descent due to ongoing functional quad weakness.    Examination-Activity Limitations  Bathing;Lift;Stand;Bed Mobility;Bend;Transfers;Toileting;Locomotion Level;Carry;Sleep;Dressing;Squat;Hygiene/Grooming;Stairs    Examination-Participation Restrictions  Community Activity;Yard Work    Stability/Clinical Decision Making  Stable/Uncomplicated    Rehab Potential  Good    PT Frequency  3x / week    PT Duration  6 weeks    PT Treatment/Interventions  ADLs/Self Care Home Management;Aquatic Therapy;Biofeedback;Cryotherapy;Electrical Stimulation;Therapeutic exercise;Therapeutic activities;Ultrasound;Functional mobility training;Moist Heat;Stair training;Iontophoresis '4mg'$ /ml Dexamethasone;DME Instruction;Gait training;Balance training;Neuromuscular re-education;Manual techniques;Patient/family education;Orthotic Fit/Training;Compression bandaging;Vasopneumatic Device;Taping;Joint Manipulations;Splinting;Energy conservation;Spinal Manipulations;Dry needling;Scar mobilization;Passive range of motion    PT Next Visit Plan  Continue to progress activity as tolerated with focus on gait, balance, and functional LE strengthening. Stair ambulation and sidestepping next visit    PT Home Exercise Plan  04/07/19: quad set, heel slide with rope, glute set, heel prop, 04/09/19: ankle pumps; 05/12/19: calf stretch at wall    Consulted and Agree with Plan of Care  Patient       Patient will benefit from skilled therapeutic intervention in order to improve the following deficits and impairments:  Abnormal gait, Pain, Decreased mobility, Decreased scar mobility, Decreased activity tolerance, Decreased endurance, Decreased range of motion, Decreased strength, Hypomobility, Decreased balance, Difficulty walking, Increased edema, Impaired flexibility  Visit Diagnosis: Right knee pain, unspecified chronicity  Other abnormalities of gait and mobility  Difficulty in walking, not elsewhere  classified     Problem List Patient Active Problem List   Diagnosis Date Noted  . Osteoarthritis of right knee 04/03/2019  . Preventative health care 08/12/2018  . Neck pain 04/29/2015  . Degeneration of cervical intervertebral disc 09/24/2013  . Pain in joint, shoulder region 09/24/2013  . Muscle weakness (generalized) 09/24/2013  . Decreased range of motion of neck 09/24/2013   1:36 PM, 05/12/19 Josue Hector PT DPT  Physical Therapist with [facility]  [phone] [address] [place], [place], [address] Phone: [phone]   Fax:  [phone]  Name: RAIDYN BREINER MRN: [id number] Date of Birth: 1950/09/28

## 2019-05-14 ENCOUNTER — Other Ambulatory Visit: Payer: Self-pay

## 2019-05-14 ENCOUNTER — Encounter (HOSPITAL_COMMUNITY): Payer: Self-pay | Admitting: Physical Therapy

## 2019-05-14 ENCOUNTER — Ambulatory Visit (HOSPITAL_COMMUNITY): Payer: BC Managed Care – PPO | Admitting: Physical Therapy

## 2019-05-14 DIAGNOSIS — R262 Difficulty in walking, not elsewhere classified: Secondary | ICD-10-CM

## 2019-05-14 DIAGNOSIS — R2689 Other abnormalities of gait and mobility: Secondary | ICD-10-CM

## 2019-05-14 DIAGNOSIS — M25561 Pain in right knee: Secondary | ICD-10-CM | POA: Diagnosis not present

## 2019-05-14 NOTE — Therapy (Signed)
[place], [place], [address] Phone: [phone]   Fax:  [phone]  Physical Therapy Treatment  Patient Details  Name: Linda Page MRN: [id number] Date of Birth: February 03, 1950 Referring Provider (PT): Rod Can MD    Encounter Date: 05/14/2019  PT End of Session - 05/14/19 1037    Visit Number  14    Number of Visits  18    Date for PT Re-Evaluation  05/23/19    Authorization Type  [employer], no visit limit, no auth req    Progress Note Due on Visit  18    PT Start Time  1030    PT Stop Time  1115    PT Time Calculation (min)  45 min    Activity Tolerance  Patient tolerated treatment well    Behavior During Therapy  [facility] for tasks assessed/performed       Past Medical History:  Diagnosis Date  . Anxiety   . Arthritis    in neck, in knee has gel shots givin in knee once a week for 3 weeks  . Depression   . Hypertension     Past Surgical History:  Procedure Laterality Date  . ANTERIOR CERVICAL DECOMP/DISCECTOMY FUSION N/A 04/29/2015   Procedure: ACDF C4-7;  Surgeon: Melina Schools, MD;  Location: [place];  Service: Orthopedics;  Laterality: N/A;  . cataract surgery     bilateral  . COLONOSCOPY WITH PROPOFOL N/A 11/05/2018   Procedure: COLONOSCOPY WITH PROPOFOL;  Surgeon: Danie Binder, MD;  [facility];  Service: Endoscopy;  Laterality: N/A;  10:30am  . HAND SURGERY     rt  . KNEE ARTHROPLASTY Right 04/03/2019   Procedure: COMPUTER ASSISTED TOTAL KNEE ARTHROPLASTY;  Surgeon: Rod Can, MD;  [facility];  Service: Orthopedics;  Laterality: Right;  . KNEE SURGERY     rt    There were no vitals filed for this visit.  Subjective Assessment - 05/14/19 1035    Subjective  Patient reports no pain currently. Says she did a lot of walking yesterday with no issue, started doing her prone hang stretch at home with cans in a grocery bag. Was a little achy last night, but  good today.    Limitations  Sitting;Lifting;Standing;Walking;House hold activities    How long can you stand comfortably?  no limit    How long can you walk comfortably?  walk as far as wants in home, hasn't tried outside much    Patient Stated Goals  Get to point where I am healing again    Currently in Pain?  No/denies                       [facility] Adult PT Treatment/Exercise - 05/14/19 0001      Knee/Hip Exercises: Stretches   Quad Stretch  Right;3 reps;30 seconds    Quad Stretch Limitations  AAROM in prone    Comptroller Limitations  standing slant board      Knee/Hip Exercises: Aerobic   Recumbent Bike  4 minutes lv 3 warmup for mobility       Knee/Hip Exercises: Machines for Strengthening   Cybex Leg Press  40# 2 x 10      Knee/Hip Exercises: Standing   Step Down  1 set;Right;10 reps;Step Height: 6";Hand Hold: 1    Functional Squat  2 sets;10 reps  Functional Squat Limitations  in front of mirror to avoid LT lean     Stairs  5RT 7inch reciprocal gait single hand rail     Rocker Board  1 minute   FWD/ Lateral   SLS  3 x10" each on solid floor     Other Standing Knee Exercises  band sidestepping with GTB 2RT      Knee/Hip Exercises: Supine   Heel Slides  Right;5 reps    Heel Slides Limitations  5" with strap     Knee Extension  AROM;Right    Knee Extension Limitations  4    Knee Flexion  Right;AROM    Knee Flexion Limitations  117      Knee/Hip Exercises: Prone   Prone Knee Hang  3 minutes    Prone Knee Hang Weights (lbs)  3#               PT Short Term Goals - 05/05/19 1116      PT SHORT TERM GOAL #1   Title  Patient will be independent with initial HEP to improve functional outcomes    Baseline  Reports compliance    Time  3    Period  Weeks    Status  Achieved    Target Date  05/02/19      PT SHORT TERM GOAL #2   Title  Patient will have RT knee AROM 0-100 degrees to improve  functional mobility and facilitate normal ambulation.    Baseline  4-115    Time  3    Period  Weeks    Status  Partially Met    Target Date  05/02/19        PT Long Term Goals - 05/05/19 1116      PT LONG TERM GOAL #1   Title  Patient will have RT knee AROM 0-120 degrees to improve functional mobility and facilitate squatting to pick up items from floor.    Baseline  4-115    Time  6    Period  Weeks    Status  On-going      PT LONG TERM GOAL #2   Title  Patient will improve FOTO score to <30% to indicate improvement in functional outcomes    Baseline  24% limited    Time  6    Period  Weeks    Status  Achieved      PT LONG TERM GOAL #3   Title  Patient will have equal to or > 4+/5 MMT throughout RLE to improve ability to perform functional mobility, stair ambulation and ADLs.    Baseline  See MMT    Time  6    Period  Weeks    Status  Partially Met      PT LONG TERM GOAL #4   Title  Patient will be able to ambulate at least 350 feet during 2MWT with LRAD to demonstrate improved ability to perform functional mobility and associated tasks.    Baseline  400 feet with no AD    Time  6    Period  Weeks    Status  Achieved            Plan - 05/14/19 1120    Clinical Impression Statement  Patient continues to be limited by mild AROM deficits. Patient able to progress LE strengthening to include stair ambulation and band sidestepping. Patient does show improvement with controlled descent with stair ambulation and is able to perform reciprocal gait  with single hand rail. Patient educated on increasing frequency of knee extension and flexion stretching in HEP to at minimum 4x daily. Patient will continue to benefit form skilled therapy services to address remaining weakness and AROM deficits to reduce pain and improve functional mobility.    Examination-Activity Limitations  Bathing;Lift;Stand;Bed Mobility;Bend;Transfers;Toileting;Locomotion  Level;Carry;Sleep;Dressing;Squat;Hygiene/Grooming;Stairs    Examination-Participation Restrictions  Community Activity;Yard Work    Stability/Clinical Decision Making  Stable/Uncomplicated    Rehab Potential  Good    PT Frequency  3x / week    PT Duration  6 weeks    PT Treatment/Interventions  ADLs/Self Care Home Management;Aquatic Therapy;Biofeedback;Cryotherapy;Electrical Stimulation;Therapeutic exercise;Therapeutic activities;Ultrasound;Functional mobility training;Moist Heat;Stair training;Iontophoresis 36m/ml Dexamethasone;DME Instruction;Gait training;Balance training;Neuromuscular re-education;Manual techniques;Patient/family education;Orthotic Fit/Training;Compression bandaging;Vasopneumatic Device;Taping;Joint Manipulations;Splinting;Energy conservation;Spinal Manipulations;Dry needling;Scar mobilization;Passive range of motion    PT Next Visit Plan  Continue to progress activity as tolerated with focus on gait, balance, and functional LE strengthening.    PT Home Exercise Plan  04/07/19: quad set, heel slide with rope, glute set, heel prop, 04/09/19: ankle pumps; 05/12/19: calf stretch at wall    Consulted and Agree with Plan of Care  Patient       Patient will benefit from skilled therapeutic intervention in order to improve the following deficits and impairments:  Abnormal gait, Pain, Decreased mobility, Decreased scar mobility, Decreased activity tolerance, Decreased endurance, Decreased range of motion, Decreased strength, Hypomobility, Decreased balance, Difficulty walking, Increased edema, Impaired flexibility  Visit Diagnosis: Right knee pain, unspecified chronicity  Other abnormalities of gait and mobility  Difficulty in walking, not elsewhere classified     Problem List Patient Active Problem List   Diagnosis Date Noted  . Osteoarthritis of right knee 04/03/2019  . Preventative health care 08/12/2018  . Neck pain 04/29/2015  . Degeneration of cervical intervertebral  disc 09/24/2013  . Pain in joint, shoulder region 09/24/2013  . Muscle weakness (generalized) 09/24/2013  . Decreased range of motion of neck 09/24/2013   2:39 PM, 05/14/19 CJosue HectorPT DPT  [facility] [phone] [address]Phone: [phone]  Fax:  [phone] Name: Linda DUCHEMINMRN: [id number] of Birth: [birth year]

## 2019-05-16 ENCOUNTER — Other Ambulatory Visit: Payer: Self-pay

## 2019-05-16 ENCOUNTER — Encounter (HOSPITAL_COMMUNITY): Payer: Self-pay | Admitting: Physical Therapy

## 2019-05-16 ENCOUNTER — Ambulatory Visit (HOSPITAL_COMMUNITY): Payer: BC Managed Care – PPO | Admitting: Physical Therapy

## 2019-05-16 DIAGNOSIS — R2689 Other abnormalities of gait and mobility: Secondary | ICD-10-CM

## 2019-05-16 DIAGNOSIS — R262 Difficulty in walking, not elsewhere classified: Secondary | ICD-10-CM

## 2019-05-16 DIAGNOSIS — M25561 Pain in right knee: Secondary | ICD-10-CM | POA: Diagnosis not present

## 2019-05-16 NOTE — Therapy (Signed)
[place], [place], [address] Phone: [phone]   Fax:  [phone]  Physical Therapy Treatment  Patient Details  Name: Linda Page MRN: [id number] Date of Birth: [birth year] Referring Provider (PT): Rod Can MD    Encounter Date: 05/16/2019  PT End of Session - 05/16/19 1038    Visit Number  15    Number of Visits  18    Date for PT Re-Evaluation  05/23/19    Authorization Type  [employer], no visit limit, no auth req    Progress Note Due on Visit  18    PT Start Time  1035    PT Stop Time  1117    PT Time Calculation (min)  42 min    Activity Tolerance  Patient tolerated treatment well    Behavior During Therapy  [facility] for tasks assessed/performed       Past Medical History:  Diagnosis Date  . Anxiety   . Arthritis    in neck, in knee has gel shots givin in knee once a week for 3 weeks  . Depression   . Hypertension     Past Surgical History:  Procedure Laterality Date  . ANTERIOR CERVICAL DECOMP/DISCECTOMY FUSION N/A 04/29/2015   Procedure: ACDF C4-7;  Surgeon: Melina Schools, MD;  Location: [place];  Service: Orthopedics;  Laterality: N/A;  . cataract surgery     bilateral  . COLONOSCOPY WITH PROPOFOL N/A 11/05/2018   Procedure: COLONOSCOPY WITH PROPOFOL;  Surgeon: Danie Binder, MD;  [facility];  Service: Endoscopy;  Laterality: N/A;  10:30am  . HAND SURGERY     rt  . KNEE ARTHROPLASTY Right 04/03/2019   Procedure: COMPUTER ASSISTED TOTAL KNEE ARTHROPLASTY;  Surgeon: Rod Can, MD;  [facility];  Service: Orthopedics;  Laterality: Right;  . KNEE SURGERY     rt    There were no vitals filed for this visit.  Subjective Assessment - 05/16/19 1037    Subjective  Patient reported she was doing prone knee hangs yesterday and therefore her knee is sore. She stated the thing she is having the most difficulty with is balance.    Limitations   Sitting;Lifting;Standing;Walking;House hold activities    How long can you stand comfortably?  no limit    How long can you walk comfortably?  walk as far as wants in home, hasn't tried outside much    Patient Stated Goals  Get to point where I am healing again    Currently in Pain?  Yes    Pain Score  3     Pain Location  Knee    Pain Orientation  Right    Pain Descriptors / Indicators  Aching                       [facility] Adult PT Treatment/Exercise - 05/16/19 0001      Knee/Hip Exercises: Stretches   Gastroc Stretch  Both;30 seconds;2 reps    Gastroc Stretch Limitations  standing slant board      Knee/Hip Exercises: Aerobic   Recumbent Bike  4 minutes lv 3 warmup for mobility       Knee/Hip Exercises: Machines for Strengthening   Cybex Leg Press  40# 2 x 10      Knee/Hip Exercises: Standing   Step Down  1 set;Right;10 reps;Step Height: 6";Hand Hold: 1    Functional Squat  2 sets;10 reps  Functional Squat Limitations  in front of mirror to avoid LT lean     Rocker Board  1 minute   Fwd and lateral   SLS  3 x10" each on solid floor     Other Standing Knee Exercises  band sidestepping with GTB 2RT    Other Standing Knee Exercises  Foam balance beam 15' x 2 tandem and x2 sidestepping      Knee/Hip Exercises: Supine   Heel Slides  Right;10 reps    Knee Extension  AROM;Right    Knee Extension Limitations  3    Knee Flexion  Right;AROM    Knee Flexion Limitations  120      Knee/Hip Exercises: Prone   Prone Knee Hang  3 minutes    Prone Knee Hang Weights (lbs)  3#               PT Short Term Goals - 05/05/19 1116      PT SHORT TERM GOAL #1   Title  Patient will be independent with initial HEP to improve functional outcomes    Baseline  Reports compliance    Time  3    Period  Weeks    Status  Achieved    Target Date  05/02/19      PT SHORT TERM GOAL #2   Title  Patient will have RT knee AROM 0-100 degrees to improve functional mobility and  facilitate normal ambulation.    Baseline  4-115    Time  3    Period  Weeks    Status  Partially Met    Target Date  05/02/19        PT Long Term Goals - 05/05/19 1116      PT LONG TERM GOAL #1   Title  Patient will have RT knee AROM 0-120 degrees to improve functional mobility and facilitate squatting to pick up items from floor.    Baseline  4-115    Time  6    Period  Weeks    Status  On-going      PT LONG TERM GOAL #2   Title  Patient will improve FOTO score to <30% to indicate improvement in functional outcomes    Baseline  24% limited    Time  6    Period  Weeks    Status  Achieved      PT LONG TERM GOAL #3   Title  Patient will have equal to or > 4+/5 MMT throughout RLE to improve ability to perform functional mobility, stair ambulation and ADLs.    Baseline  See MMT    Time  6    Period  Weeks    Status  Partially Met      PT LONG TERM GOAL #4   Title  Patient will be able to ambulate at least 350 feet during 2MWT with LRAD to demonstrate improved ability to perform functional mobility and associated tasks.    Baseline  400 feet with no AD    Time  6    Period  Weeks    Status  Achieved            Plan - 05/16/19 1133    Clinical Impression Statement  Patient reported feeling like balance is the main thing that she still needs to work on. Began on the bicycle, and this time was unbilled. Added tandem and sidestepping balance challenge on the foam. This session the patient's AROM ranged from 3-120 degrees. She  reported feeling better at the end of the session than ho she felt at the start.    Examination-Activity Limitations  Bathing;Lift;Stand;Bed Mobility;Bend;Transfers;Toileting;Locomotion Level;Carry;Sleep;Dressing;Squat;Hygiene/Grooming;Stairs    Examination-Participation Restrictions  Community Activity;Yard Work    Stability/Clinical Decision Making  Stable/Uncomplicated    Rehab Potential  Good    PT Frequency  3x / week    PT Duration  6 weeks     PT Treatment/Interventions  ADLs/Self Care Home Management;Aquatic Therapy;Biofeedback;Cryotherapy;Electrical Stimulation;Therapeutic exercise;Therapeutic activities;Ultrasound;Functional mobility training;Moist Heat;Stair training;Iontophoresis '4mg'$ /ml Dexamethasone;DME Instruction;Gait training;Balance training;Neuromuscular re-education;Manual techniques;Patient/family education;Orthotic Fit/Training;Compression bandaging;Vasopneumatic Device;Taping;Joint Manipulations;Splinting;Energy conservation;Spinal Manipulations;Dry needling;Scar mobilization;Passive range of motion    PT Next Visit Plan  Continue to progress activity as tolerated with focus on gait, balance, and functional LE strengthening.    PT Home Exercise Plan  04/07/19: quad set, heel slide with rope, glute set, heel prop, 04/09/19: ankle pumps; 05/12/19: calf stretch at wall    Consulted and Agree with Plan of Care  Patient       Patient will benefit from skilled therapeutic intervention in order to improve the following deficits and impairments:  Abnormal gait, Pain, Decreased mobility, Decreased scar mobility, Decreased activity tolerance, Decreased endurance, Decreased range of motion, Decreased strength, Hypomobility, Decreased balance, Difficulty walking, Increased edema, Impaired flexibility  Visit Diagnosis: Right knee pain, unspecified chronicity  Other abnormalities of gait and mobility  Difficulty in walking, not elsewhere classified     Problem List Patient Active Problem List   Diagnosis Date Noted  . Osteoarthritis of right knee 04/03/2019  . Preventative health care 08/12/2018  . Neck pain 04/29/2015  . Degeneration of cervical intervertebral disc 09/24/2013  . Pain in joint, shoulder region 09/24/2013  . Muscle weakness (generalized) 09/24/2013  . Decreased range of motion of neck 09/24/2013   Clarene Critchley PT, DPT 11:35 AM, 05/16/19 [place], [place], [address] Phone: [phone]   Fax:  [phone]  Name: Linda Page MRN: [id number] Date of Birth: [birth year]

## 2019-05-19 ENCOUNTER — Ambulatory Visit (HOSPITAL_COMMUNITY): Payer: BC Managed Care – PPO | Admitting: Physical Therapy

## 2019-05-21 ENCOUNTER — Encounter (HOSPITAL_COMMUNITY): Payer: Self-pay | Admitting: Physical Therapy

## 2019-05-21 ENCOUNTER — Ambulatory Visit (HOSPITAL_COMMUNITY): Payer: BC Managed Care – PPO | Admitting: Physical Therapy

## 2019-05-21 ENCOUNTER — Other Ambulatory Visit: Payer: Self-pay

## 2019-05-21 DIAGNOSIS — R262 Difficulty in walking, not elsewhere classified: Secondary | ICD-10-CM

## 2019-05-21 DIAGNOSIS — M25561 Pain in right knee: Secondary | ICD-10-CM | POA: Diagnosis not present

## 2019-05-21 DIAGNOSIS — R2689 Other abnormalities of gait and mobility: Secondary | ICD-10-CM

## 2019-05-21 NOTE — Patient Instructions (Signed)
Access Code: [email or web address] URL: [email or web address] Date: 05/21/2019 Prepared by: Georges Lynch  Exercises Heel rises with counter support - 1 x daily - 4 x weekly - 2 sets - 10 reps Squat with Chair Touch - 1 x daily - 4 x weekly - 2 sets - 10 reps Hip Abduction with Resistance Loop - 1 x daily - 4 x weekly - 2 sets - 10 reps Hip Extension with Resistance Loop - 1 x daily - 4 x weekly - 2 sets - 10 reps Standing Knee Flexion AROM with Chair Support - 1 x daily - 4 x weekly - 2 sets - 10 reps Tandem Stance with Support - 1 x daily - 4 x weekly - 1 sets - 3 reps - 30 hold

## 2019-05-21 NOTE — Therapy (Signed)
[facility] [address] [place], [place], [address] Phone: [phone]   Fax:  [phone]  Physical Therapy Treatment/ Discharge Summary  Patient Details  Name: Linda Page MRN: [id number] Date of Birth: 1950/07/12 Referring Provider (PT): Rod Can MD   PHYSICAL THERAPY DISCHARGE SUMMARY  Visits from Start of Care: 16  Current functional level related to goals / functional outcomes: See below   Remaining deficits: See below   Education / Equipment: See assessment  Plan: Patient agrees to discharge.  Patient goals were met. Patient is being discharged due to being pleased with the current functional level.  ?????      Encounter Date: 05/21/2019  PT End of Session - 05/21/19 0913    Visit Number  16    Number of Visits  18    Date for PT Re-Evaluation  05/23/19    Authorization Type  [employer] PPO, no visit limit, no auth req    Progress Note Due on Visit  18    PT Start Time  0900    PT Stop Time  0945    PT Time Calculation (min)  45 min    Activity Tolerance  Patient tolerated treatment well    Behavior During Therapy  WFL for tasks assessed/performed       Past Medical History:  Diagnosis Date  . Anxiety   . Arthritis    in neck, in knee has gel shots givin in knee once a week for 3 weeks  . Depression   . Hypertension     Past Surgical History:  Procedure Laterality Date  . ANTERIOR CERVICAL DECOMP/DISCECTOMY FUSION N/A 04/29/2015   Procedure: ACDF C4-7;  Surgeon: Melina Schools, MD;  Location: [facility];  Service: Orthopedics;  Laterality: N/A;  . cataract surgery     bilateral  . COLONOSCOPY WITH PROPOFOL N/A 11/05/2018   Procedure: COLONOSCOPY WITH PROPOFOL;  Surgeon: Danie Binder, MD;  [facility];  Service: Endoscopy;  Laterality: N/A;  10:30am  . HAND SURGERY     rt  . KNEE ARTHROPLASTY Right 04/03/2019   Procedure: COMPUTER ASSISTED TOTAL KNEE ARTHROPLASTY;  Surgeon:  Rod Can, MD;  [facility];  Service: Orthopedics;  Laterality: Right;  . KNEE SURGERY     rt    There were no vitals filed for this visit.  Subjective Assessment - 05/21/19 0907    Subjective  Patient says she had f/u with MD last week and was pleased with results. Says she has been released by MD and can return to work on 6/1. Patient says she was somewhat sore last night, possibly due to increased activity over the weekend. Reports no pain currently. Patient says she feels about 98-99% improvement since starting therapy and feels ready for DC today.    Limitations  Sitting;Lifting;Standing;Walking;House hold activities    How long can you stand comfortably?  no limit    How long can you walk comfortably?  not limited    Patient Stated Goals  Get to point where I am healing again    Currently in Pain?  No/denies         [facility] PT Assessment - 05/21/19 0001      Assessment   Medical Diagnosis  RT TKA     Referring Provider (PT)  Rod Can MD     Onset Date/Surgical Date  04/03/19    Prior Therapy  Yes       Precautions  Precautions  None      Restrictions   Weight Bearing Restrictions  No      Balance Screen   Has the patient fallen in the past 6 months  No    Has the patient had a decrease in activity level because of a fear of falling?   No    Is the patient reluctant to leave their home because of a fear of falling?   No      Home Film/video editor residence      Prior Function   Level of Independence  Independent      Cognition   Overall Cognitive Status  Within Functional Limits for tasks assessed      Observation/Other Assessments   Observations  incision is intact and appears to be well healed     Focus on Therapeutic Outcomes (FOTO)   1% limited    was 24%      Sensation   Light Touch  Appears Intact      AROM   Right Knee Extension  3   was 4   Right Knee Flexion  120   was 115     Strength   Right Hip  Flexion  5/5   was 4+   Right Hip Extension  4+/5   was 4   Right Hip ABduction  4+/5   was 4   Left Hip Flexion  5/5    Left Hip Extension  5/5   was 4+   Left Hip ABduction  4+/5    Right Knee Flexion  5/5   was 4+   Right Knee Extension  5/5   was 4   Left Knee Flexion  5/5    Left Knee Extension  5/5      Ambulation/Gait   Ambulation/Gait  Yes    Ambulation/Gait Assistance  7: Independent    Assistive device  None    Gait Pattern  Decreased step length - right;Decreased step length - left    Ambulation Surface  Level;Indoor      Static Standing Balance   Static Standing Balance -  Activities   Tandam Stance - Right Leg;Tandam Stance - Left Leg    Static Standing - Comment/# of Minutes  30 sec, 30 sec                    [facility] Adult PT Treatment/Exercise - 05/21/19 0001      Knee/Hip Exercises: Stretches   Sports administrator  Right;3 reps;30 seconds    Quad Stretch Limitations  AAROM in prone    Knee: Self-Stretch to increase Flexion  Right;10 seconds    Knee: Self-Stretch Limitations  10x on 12 inch box     Gastroc Stretch  Both;30 seconds;3 reps    Gastroc Stretch Limitations  standing slant board      Knee/Hip Exercises: Aerobic   Recumbent Bike  4 minutes lv 3 warmup for mobility       Knee/Hip Exercises: Standing   Stairs  3RT 7inch reciprocal gait single hand rail              PT Education - 05/21/19 0913    Education Details  On transition to DC to HEP and HEP handout    Person(s) Educated  Patient    Methods  Explanation;Handout    Comprehension  Verbalized understanding       PT Short Term Goals - 05/21/19 [phone]  PT SHORT TERM GOAL #1   Title  Patient will be independent with initial HEP to improve functional outcomes    Baseline  Reports compliance    Time  3    Period  Weeks    Status  Achieved    Target Date  05/02/19      PT SHORT TERM GOAL #2   Title  Patient will have RT knee AROM 0-100 degrees to improve functional  mobility and facilitate normal ambulation.    Baseline  3-120    Time  3    Period  Weeks    Status  Partially Met    Target Date  05/02/19        PT Long Term Goals - 05/21/19 0941      PT LONG TERM GOAL #1   Title  Patient will have RT knee AROM 0-120 degrees to improve functional mobility and facilitate squatting to pick up items from floor.    Baseline  3-120    Time  6    Period  Weeks    Status  Partially Met      PT LONG TERM GOAL #2   Title  Patient will improve FOTO score to <30% to indicate improvement in functional outcomes    Baseline  1% limited    Time  6    Period  Weeks    Status  Achieved      PT LONG TERM GOAL #3   Title  Patient will have equal to or > 4+/5 MMT throughout RLE to improve ability to perform functional mobility, stair ambulation and ADLs.    Baseline  See MMT    Time  6    Period  Weeks    Status  Achieved      PT LONG TERM GOAL #4   Title  Patient will be able to ambulate at least 350 feet during 2MWT with LRAD to demonstrate improved ability to perform functional mobility and associated tasks.    Baseline  400 feet with no AD    Time  6    Period  Weeks    Status  Achieved            Plan - 05/21/19 1157    Clinical Impression Statement  Patient has made good progress toward therapy goals. Patient has currently met/ partially met all therapy goals and is being DC from therapy today. Patient shows significant improvement in gait, balance, stair ambulation and overall reduction in subjective complaint of pain. Patient still limited by slight AROM knee extension restriction, but with no apparent impact on functional ability. Patient educated on and issued update HEP handout and encouraged to continue working knee ROM and functional strengthening to maintain current functional level. Patient instructed to follow up with therapy services with any further questions or concerns.    Examination-Activity Limitations  Bathing;Lift;Stand;Bed  Mobility;Bend;Transfers;Toileting;Locomotion Level;Carry;Sleep;Dressing;Squat;Hygiene/Grooming;Stairs    Examination-Participation Restrictions  Community Activity;Yard Work    Stability/Clinical Decision Making  Stable/Uncomplicated    Rehab Potential  Good    PT Treatment/Interventions  ADLs/Self Care Home Management;Aquatic Therapy;Biofeedback;Cryotherapy;Electrical Stimulation;Therapeutic exercise;Therapeutic activities;Ultrasound;Functional mobility training;Moist Heat;Stair training;Iontophoresis 20m/ml Dexamethasone;DME Instruction;Gait training;Balance training;Neuromuscular re-education;Manual techniques;Patient/family education;Orthotic Fit/Training;Compression bandaging;Vasopneumatic Device;Taping;Joint Manipulations;Splinting;Energy conservation;Spinal Manipulations;Dry needling;Scar mobilization;Passive range of motion    PT Next Visit Plan  DC    PT Home Exercise Plan  04/07/19: quad set, heel slide with rope, glute set, heel prop, 04/09/19: ankle pumps; 05/12/19: calf stretch at wall    Consulted and Agree with  Plan of Care  Patient       Patient will benefit from skilled therapeutic intervention in order to improve the following deficits and impairments:  Abnormal gait, Pain, Decreased mobility, Decreased scar mobility, Decreased activity tolerance, Decreased endurance, Decreased range of motion, Decreased strength, Hypomobility, Decreased balance, Difficulty walking, Increased edema, Impaired flexibility  Visit Diagnosis: Right knee pain, unspecified chronicity  Other abnormalities of gait and mobility  Difficulty in walking, not elsewhere classified     Problem List Patient Active Problem List   Diagnosis Date Noted  . Osteoarthritis of right knee 04/03/2019  . Preventative health care 08/12/2018  . Neck pain 04/29/2015  . Degeneration of cervical intervertebral disc 09/24/2013  . Pain in joint, shoulder region 09/24/2013  . Muscle weakness (generalized) 09/24/2013  .  Decreased range of motion of neck 09/24/2013   12:07 PM, 05/21/19 Josue Hector PT DPT  Physical Therapist with [facility]  [phone] [place] [address] [place], [place], [address] Phone: [phone]   Fax:  [phone]  Name: CARLEE VONDERHAAR MRN: [id number] Date of Birth: 1950/06/18

## 2019-05-23 ENCOUNTER — Encounter (HOSPITAL_COMMUNITY): Payer: BC Managed Care – PPO | Admitting: Physical Therapy

## 2020-01-13 ENCOUNTER — Other Ambulatory Visit: Payer: Self-pay

## 2020-01-13 ENCOUNTER — Encounter (HOSPITAL_COMMUNITY): Payer: Self-pay

## 2020-01-13 ENCOUNTER — Ambulatory Visit (HOSPITAL_COMMUNITY): Payer: BC Managed Care – PPO | Attending: Orthopedic Surgery

## 2020-01-13 DIAGNOSIS — M25611 Stiffness of right shoulder, not elsewhere classified: Secondary | ICD-10-CM | POA: Diagnosis present

## 2020-01-13 DIAGNOSIS — R29898 Other symptoms and signs involving the musculoskeletal system: Secondary | ICD-10-CM | POA: Diagnosis present

## 2020-01-13 DIAGNOSIS — M25511 Pain in right shoulder: Secondary | ICD-10-CM | POA: Insufficient documentation

## 2020-01-13 DIAGNOSIS — G8929 Other chronic pain: Secondary | ICD-10-CM | POA: Diagnosis present

## 2020-01-13 NOTE — Therapy (Signed)
[facility] [address] [place], [place], [address] Phone: [phone]   Fax:  [phone]  Occupational Therapy Evaluation  Patient Details  Name: Linda Page MRN: [id number] Date of Birth: 1950/12/24 Referring Provider (OT): Nicholes Stairs, MD   Encounter Date: 01/13/2020   OT End of Session - 01/13/20 1253    Visit Number 1    Number of Visits 5    Date for OT Re-Evaluation 02/17/20    Authorization Type [employer] state health plan    Authorization Time Period 01/10/20-01/08/21 $52 copay No visit limit    OT Start Time 0816    OT Stop Time 0856    OT Time Calculation (min) 40 min    Activity Tolerance Patient tolerated treatment well    Behavior During Therapy [facility] for tasks assessed/performed           Past Medical History:  Diagnosis Date  . Anxiety   . Arthritis    in neck, in knee has gel shots givin in knee once a week for 3 weeks  . Depression   . Hypertension     Past Surgical History:  Procedure Laterality Date  . ANTERIOR CERVICAL DECOMP/DISCECTOMY FUSION N/A 04/29/2015   Procedure: ACDF C4-7;  Surgeon: Melina Schools, MD;  Location: [facility];  Service: Orthopedics;  Laterality: N/A;  . cataract surgery     bilateral  . COLONOSCOPY WITH PROPOFOL N/A 11/05/2018   Procedure: COLONOSCOPY WITH PROPOFOL;  Surgeon: Danie Binder, MD;  [facility];  Service: Endoscopy;  Laterality: N/A;  10:30am  . HAND SURGERY     rt  . KNEE ARTHROPLASTY Right 04/03/2019   Procedure: COMPUTER ASSISTED TOTAL KNEE ARTHROPLASTY;  Surgeon: Rod Can, MD;  [facility];  Service: Orthopedics;  Laterality: Right;  . KNEE SURGERY     rt    There were no vitals filed for this visit.   Subjective Assessment - 01/13/20 0826    Subjective  S: It has been bothering me off and on for a long time but maybe in the past year it became worse/more painful.    Pertinent History Patient is a 70 y/o female presenting with  possible left shoulder rotator cuff tear with chronic pain that has been on and off for several years. Patient received on steriod injection for pain although Dr. Stann Mainland does not recommend any more. Dr. Stann Mainland has referred patient to occupational therapy for evaluation and treatment prior to discussing possible surgery.    Patient Stated Goals To decrease pain    Currently in Pain? Yes    Pain Score 7    with activity   Pain Location Shoulder    Pain Orientation Right    Pain Descriptors / Indicators Sharp;Dull    Pain Type Chronic pain    Pain Radiating Towards down shoulder blade and down into upper arm.    Pain Frequency Intermittent    Aggravating Factors  Any stretching, reaching, raising arm above her head, trying to do any lifting.    Pain Relieving Factors Tramadol or Naproxen if needed.    Effect of Pain on Daily Activities Pt pushes through the pain.    Multiple Pain Sites No             [facility] OT Assessment - 01/13/20 0829      Assessment   Medical Diagnosis possible Right RTC tear.    Referring Provider (OT) Nicholes Stairs, MD    Onset  Date/Surgical Date --   on and off for several years. In the past year it became recently bad.   Hand Dominance Right    Next MD Visit 02/24/20    Prior Therapy PT for right knee replacement in 2019.      Precautions   Precautions None      Restrictions   Weight Bearing Restrictions No      Balance Screen   Has the patient fallen in the past 6 months Yes    How many times? 2-3    Has the patient had a decrease in activity level because of a fear of falling?  No    Is the patient reluctant to leave their home because of a fear of falling?  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     ADL   ADL  comments Difficulty with reaching up overhead, behind her back, lifting, fixing her hair, getting dressed. Sometimes bothers her at work when she has to reach for items.      Mobility   Mobility Status Independent      Vision - History   Baseline Vision No visual deficits      Cognition   Overall Cognitive Status Within Functional Limits for tasks assessed      Observation/Other Assessments   Focus on Therapeutic Outcomes (FOTO)  Complete next session      Posture/Postural Control   Posture/Postural Control Postural limitations    Postural Limitations Rounded Shoulders;Forward head      ROM / Strength   AROM / PROM / Strength AROM;PROM;Strength      Palpation   Palpation comment Max fascial restrictions and muscle tenderness palpated in the right upper arm, trapezius and scapularis region.      AROM   Overall AROM Comments Assessed seated. IR/er adducted.    AROM Assessment Site Shoulder    Right/Left Shoulder Right    Right Shoulder Flexion 115 Degrees    Right Shoulder ABduction 95 Degrees    Right Shoulder Internal Rotation 90 Degrees    Right Shoulder External Rotation 39 Degrees      PROM   Overall PROM Comments Assessed supine. IR/er adducted    PROM Assessment Site Shoulder    Right/Left Shoulder Right    Right Shoulder Flexion 148 Degrees    Right Shoulder ABduction 180 Degrees    Right Shoulder Internal Rotation 90 Degrees    Right Shoulder External Rotation 45 Degrees      Strength   Overall Strength Comments Assessed seated. IR/er adducted    Strength Assessment Site Shoulder    Right/Left Shoulder Right    Right Shoulder Flexion 3/5    Right Shoulder ABduction 3-/5    Right Shoulder Internal Rotation 5/5    Right Shoulder External Rotation 4+/5                             OT Short Term Goals - 01/13/20 1248      OT SHORT TERM GOAL #1   Title patient will be educated and independent with HEP in order to increase functional use of  her RUE and decrease discomfort when performing daily  tasks.    Time 5    Period Weeks    Status New    Target Date 02/17/20      OT SHORT TERM GOAL #2   Title Patient will increase her RUE P/ROM to WNL in order to allow her to complete upper body dressing tasks with less difficulty.    Time 5    Period Weeks    Status New      OT SHORT TERM GOAL #3   Title Patient will report a decrease level of pain of approximately 5/10 or less when utilizing her RUE to complete waist level tasks.    Time 5    Period Weeks    Status New      OT SHORT TERM GOAL #4   Title Patient will decrease her RUE shoulder fascial restrictions to min amount or less in order to increase her functional mobility needed to complete daily tasks.    Time 5    Period Weeks    Status New      OT SHORT TERM GOAL #5   Title Patient will increase her scapular strength and stability while demonstrating overall improved seated and standing posture when attempting reaching tasks at or below shoulder at home and work.    Time 5    Period Weeks    Status New                    Plan - 01/13/20 1255    Clinical Impression Statement A: Patient is a 70 y/o female S/P right shoulder pain with possible RTC tear causing increased fascial restrictions, decreased ROM and strength resulting in difficulty utilizing her RUE as her dominant extremity at home and work. Due to high copay, discussed attending therapy sessions once a week prior to returning to MD for follow up.    OT Occupational Profile and History Detailed Assessment- Review of Records and additional review of physical, cognitive, psychosocial history related to current functional performance    Occupational performance deficits (Please refer to evaluation for details): ADL's;IADL's;Work;Rest and Sleep    Body Structure / Function / Physical Skills ADL;UE functional use;Fascial restriction;Pain;ROM;Mobility;Strength    Rehab Potential Good    Clinical Decision  Making Several treatment options, min-mod task modification necessary    Comorbidities Affecting Occupational Performance: May have comorbidities impacting occupational performance    Modification or Assistance to Complete Evaluation  Min-Moderate modification of tasks or assist with assess necessary to complete eval    OT Frequency 1x / week    OT Duration Other (comment)   5 weeks   OT Treatment/Interventions Self-care/ADL training;Ultrasound;Patient/family education;DME and/or AE instruction;Passive range of motion;Cryotherapy;Electrical Stimulation;Moist Heat;Neuromuscular education;Therapeutic exercise;Manual Therapy;Therapeutic activities    Plan P: Patient will benefit from skilled OT services to increase functional use of RUE with increased comfort when completing basic ADL and work tasks. Treatment Plan: Complete FOTO next session. Myofascial release (gentle), P/ROM, AA/ROM, A/ROM, and scapular strengthening. Update HEP as needed.    OT Home Exercise Plan eval: table slides, scapular A/ROM    Consulted and Agree with Plan of Care Patient           Patient will benefit from skilled therapeutic intervention in order to improve the following deficits and impairments:   Body Structure / Function / Physical Skills: ADL,UE functional use,Fascial restriction,Pain,ROM,Mobility,Strength       Visit Diagnosis: Other symptoms and signs involving the musculoskeletal system - Plan: Ot plan of care cert/re-cert  Chronic right shoulder  pain - Plan: Ot plan of care cert/re-cert  Stiffness of right shoulder, not elsewhere classified - Plan: Ot plan of care cert/re-cert    Problem List Patient Active Problem List   Diagnosis Date Noted  . Osteoarthritis of right knee 04/03/2019  . Preventative health care 08/12/2018  . Neck pain 04/29/2015  . Degeneration of cervical intervertebral disc 09/24/2013  . Pain in joint, shoulder region 09/24/2013  . Muscle weakness (generalized) 09/24/2013  .  Decreased range of motion of neck 09/24/2013   Limmie Patricia, OTR/L,CBIS  [phone]  01/13/2020, 1:26 PM  [facility] [address] [place], [place], [address] Phone: [phone]   Fax:  [phone]  Name: KHRYSTYNE ARPIN MRN: [id number] Date of Birth: 1950-12-12

## 2020-01-13 NOTE — Patient Instructions (Signed)
Try to use either ice or heat for 10-20 minutes for pain in the right shoulder.   1) Seated Row   Sit up straight with elbows by your sides. Pull back with shoulders/elbows, keeping forearms straight, as if pulling back on the reins of a horse. Squeeze shoulder blades together. Repeat _10-15__times, __1-2__sets/day       3) Shoulder Extension    Sit up straight with both arms by your side, draw your arms back behind your waist. Keep your elbows straight. Repeat __10-15__times, __1-2__sets/day.         1) SHOULDER: Flexion On Table   Place hands on towel placed on table, elbows straight. Lean forward with you upper body, pushing towel away from body.  10-15_ reps per set, __1-2_ sets per day  2) Abduction (Passive)   With arm out to side, resting on towel placed on table with palm DOWN, keeping trunk away from table, lean to the side while pushing towel away from body.  Repeat _10-15___ times. Do __1-2__ sessions per day.  Copyright  VHI. All rights reserved.     3) Internal Rotation (Assistive)   Seated with elbow bent at right angle and held against side, slide arm on table surface in an inward arc keeping elbow anchored in place. Repeat __10-15__ times. Do _1-2___ sessions per day. Activity: Use this motion to brush crumbs off the table.  Copyright  VHI. All rights reserved.

## 2020-01-21 ENCOUNTER — Other Ambulatory Visit: Payer: Self-pay

## 2020-01-21 ENCOUNTER — Ambulatory Visit (HOSPITAL_COMMUNITY): Payer: BC Managed Care – PPO

## 2020-01-21 ENCOUNTER — Encounter (HOSPITAL_COMMUNITY): Payer: Self-pay

## 2020-01-21 DIAGNOSIS — R29898 Other symptoms and signs involving the musculoskeletal system: Secondary | ICD-10-CM | POA: Diagnosis not present

## 2020-01-21 DIAGNOSIS — M25611 Stiffness of right shoulder, not elsewhere classified: Secondary | ICD-10-CM

## 2020-01-21 DIAGNOSIS — G8929 Other chronic pain: Secondary | ICD-10-CM

## 2020-01-21 NOTE — Therapy (Signed)
[facility] [address] [place], [place], [address] Phone: [phone]   Fax:  [phone]  Occupational Therapy Treatment  Patient Details  Name: Linda Page MRN: [id number] Date of Birth: Jun 08, 1950 Referring Provider (OT): Yolonda Kida, MD   Encounter Date: 01/21/2020   OT End of Session - 01/21/20 0947    Visit Number 2    Number of Visits 5    Date for OT Re-Evaluation 02/17/20    Authorization Type BCBS state health plan    Authorization Time Period 01/10/20-01/08/21 $52 copay No visit limit    OT Start Time 0815    OT Stop Time 0854    OT Time Calculation (min) 39 min    Activity Tolerance Patient tolerated treatment well    Behavior During Therapy [facility] for tasks assessed/performed           Past Medical History:  Diagnosis Date  . Anxiety   . Arthritis    in neck, in knee has gel shots givin in knee once a week for 3 weeks  . Depression   . Hypertension     Past Surgical History:  Procedure Laterality Date  . ANTERIOR CERVICAL DECOMP/DISCECTOMY FUSION N/A 04/29/2015   Procedure: ACDF C4-7;  Surgeon: Venita Lick, MD;  Location: [facility] OR;  Service: Orthopedics;  Laterality: N/A;  . cataract surgery     bilateral  . COLONOSCOPY WITH PROPOFOL N/A 11/05/2018   Procedure: COLONOSCOPY WITH PROPOFOL;  Surgeon: West Bali, MD;  Location: [facility] ENDO SU[facility];  Service: Endoscopy;  Laterality: N/A;  10:30am  . HAND SURGERY     rt  . KNEE ARTHROPLASTY Right 04/03/2019   Procedure: COMPUTER ASSISTED TOTAL KNEE ARTHROPLASTY;  Surgeon: Samson Frederic, MD;  Location: [facility] ORS;  Service: Orthopedics;  Laterality: Right;  . KNEE SURGERY     rt    There were no vitals filed for this visit.   Subjective Assessment - 01/21/20 0852    Subjective  S: I don't have any pain right now. Night time is usually the worse.    Currently in Pain? No/denies              [facility] OT Assessment - 01/21/20 0849      Assessment    Medical Diagnosis possible Right RTC tear.      Precautions   Precautions None      Observation/Other Assessments   Focus on Therapeutic Outcomes (FOTO)  49/100                    OT Treatments/Exercises (OP) - 01/21/20 0849      Exercises   Exercises Shoulder      Shoulder Exercises: Supine   Protraction PROM;10 reps    Horizontal ABduction PROM;10 reps    External Rotation PROM;10 reps    Internal Rotation PROM;10 reps    Flexion PROM;10 reps    ABduction PROM;10 reps      Shoulder Exercises: Therapy Ball   Flexion Right;10 reps    ABduction Right;10 reps      Manual Therapy   Manual Therapy Myofascial release    Manual therapy comments Manual therapy completed prior to exercises.    Myofascial Release Myofascial release and manual stretching completed to right upper arm, upper trapezius, and scapularis region to decrease fascial restriction and increase joint mobility in a pain free zone.  OT Education - 01/21/20 2297    Education Details reviewed therapy goals. Reviewed the focus on therapy and how it can benefit and strengthen the muscle around the tear and hopefully decrease pain and increase functional mobility. Recommended using ice after therapy for 5-10 minutes to help with any pain    Person(s) Educated Patient    Methods Explanation    Comprehension Verbalized understanding            OT Short Term Goals - 01/21/20 9892      OT SHORT TERM GOAL #1   Title patient will be educated and independent with HEP in order to increase functional use of her RUE and decrease discomfort when performing daily tasks.    Time 5    Period Weeks    Status On-going    Target Date 02/17/20      OT SHORT TERM GOAL #2   Title Patient will increase her RUE P/ROM to WNL in order to allow her to complete upper body dressing tasks with less difficulty.    Time 5    Period Weeks    Status On-going      OT SHORT TERM GOAL #3   Title Patient  will report a decrease level of pain of approximately 5/10 or less when utilizing her RUE to complete waist level tasks.    Time 5    Period Weeks    Status On-going      OT SHORT TERM GOAL #4   Title Patient will decrease her RUE shoulder fascial restrictions to min amount or less in order to increase her functional mobility needed to complete daily tasks.    Time 5    Period Weeks    Status On-going      OT SHORT TERM GOAL #5   Title Patient will increase her scapular strength and stability while demonstrating overall improved seated and standing posture when attempting reaching tasks at or below shoulder at home and work.    Time 5    Period Weeks    Status On-going                    Plan - 01/21/20 0949    Clinical Impression Statement A: Initiated myofascial release and manual stretching. Pt demonstrates increased muscle guarding due to pain with movement. VC and education provided on relaxation techniques. Discussed how pain level increasing when movement is associated with pain. Patient with increased tenderness in upper arm and upper trapezius region. Manual techniques completed to address.    Body Structure / Function / Physical Skills ADL;UE functional use;Fascial restriction;Pain;ROM;Mobility;Strength    Plan P: Follow up on pain level from last session. Continue with P/ROM supine. Add thumb tacks and standing table wash.    Consulted and Agree with Plan of Care Patient           Patient will benefit from skilled therapeutic intervention in order to improve the following deficits and impairments:   Body Structure / Function / Physical Skills: ADL,UE functional use,Fascial restriction,Pain,ROM,Mobility,Strength       Visit Diagnosis: Other symptoms and signs involving the musculoskeletal system  Chronic right shoulder pain  Stiffness of right shoulder, not elsewhere classified    Problem List Patient Active Problem List   Diagnosis Date Noted  .  Osteoarthritis of right knee 04/03/2019  . Preventative health care 08/12/2018  . Neck pain 04/29/2015  . Degeneration of cervical intervertebral disc 09/24/2013  . Pain in joint, shoulder region 09/24/2013  .  Muscle weakness (generalized) 09/24/2013  . Decreased range of motion of neck 09/24/2013   Limmie Patricia, OTR/L,CBIS  [phone]  01/21/2020, 9:51 AM  [place] [facility] [address] [place], [place], [address] Phone: [phone]   Fax:  [phone]  Name: Linda Page MRN: [id number] Date of Birth: 1950-03-06

## 2020-01-28 ENCOUNTER — Encounter (HOSPITAL_COMMUNITY): Payer: Self-pay

## 2020-01-28 ENCOUNTER — Ambulatory Visit (HOSPITAL_COMMUNITY): Payer: BC Managed Care – PPO

## 2020-01-28 ENCOUNTER — Other Ambulatory Visit: Payer: Self-pay

## 2020-01-28 DIAGNOSIS — R29898 Other symptoms and signs involving the musculoskeletal system: Secondary | ICD-10-CM | POA: Diagnosis not present

## 2020-01-28 DIAGNOSIS — M25611 Stiffness of right shoulder, not elsewhere classified: Secondary | ICD-10-CM

## 2020-01-28 DIAGNOSIS — G8929 Other chronic pain: Secondary | ICD-10-CM

## 2020-01-28 NOTE — Therapy (Signed)
[place] [facility] [address] [place], [place], [address] Phone: [phone]   Fax:  [phone]  Occupational Therapy Treatment  Patient Details  Name: Linda Page MRN: [id number] Date of Birth: 1950/11/12 Referring Provider (OT): Yolonda Kida, MD   Encounter Date: 01/28/2020   OT End of Session - 01/28/20 1143    Visit Number 3    Number of Visits 5    Date for OT Re-Evaluation 02/17/20    Authorization Type BCBS state health plan    Authorization Time Period 01/10/20-01/08/21 $52 copay No visit limit    OT Start Time 0815    OT Stop Time 0853    OT Time Calculation (min) 38 min    Activity Tolerance Patient tolerated treatment well    Behavior During Therapy [facility] for tasks assessed/performed           Past Medical History:  Diagnosis Date  . Anxiety   . Arthritis    in neck, in knee has gel shots givin in knee once a week for 3 weeks  . Depression   . Hypertension     Past Surgical History:  Procedure Laterality Date  . ANTERIOR CERVICAL DECOMP/DISCECTOMY FUSION N/A 04/29/2015   Procedure: ACDF C4-7;  Surgeon: Venita Lick, MD;  Location: [facility] OR;  Service: Orthopedics;  Laterality: N/A;  . cataract surgery     bilateral  . COLONOSCOPY WITH PROPOFOL N/A 11/05/2018   Procedure: COLONOSCOPY WITH PROPOFOL;  Surgeon: West Bali, MD;  Location: [facility];  Service: Endoscopy;  Laterality: N/A;  10:30am  . HAND SURGERY     rt  . KNEE ARTHROPLASTY Right 04/03/2019   Procedure: COMPUTER ASSISTED TOTAL KNEE ARTHROPLASTY;  Surgeon: Samson Frederic, MD;  Location: [facility] ORS;  Service: Orthopedics;  Laterality: Right;  . KNEE SURGERY     rt    There were no vitals filed for this visit.   Subjective Assessment - 01/28/20 0821    Subjective  S: I don't know what it is about my couch but I can't get comfortable when I'm sitting.    Currently in Pain? Yes    Pain Score 6     Pain Location Shoulder    Pain Orientation  Right    Pain Descriptors / Indicators Aching    Pain Type Chronic pain    Pain Radiating Towards upper arm    Pain Frequency Constant    Aggravating Factors  unknown    Pain Relieving Factors pain medication    Effect of Pain on Daily Activities pt pushes through the pain    Multiple Pain Sites No                        OT Treatments/Exercises (OP) - 01/28/20 0001      Exercises   Exercises Shoulder      Shoulder Exercises: Supine   Protraction PROM;5 reps    Horizontal ABduction PROM;5 reps    External Rotation PROM;5 reps    Internal Rotation PROM;5 reps    Flexion PROM;5 reps    ABduction PROM;5 reps      Shoulder Exercises: Standing   Other Standing Exercises Standing countertop wash, flexion, horizontal abduction/adduction, circles (left/right) 10X      Modalities   Modalities Electrical Stimulation;Moist Heat      Moist Heat Therapy   Number Minutes Moist Heat 10 Minutes    Moist Heat Location Shoulder  Programme researcher, broadcasting/film/video Location right upper arm    Special educational needs teacher Parameters 6.4 CV    Electrical Stimulation Goals Pain      Manual Therapy   Manual Therapy Myofascial release    Manual therapy comments Manual therapy completed prior to exercises.    Myofascial Release Myofascial release and manual stretching completed to right upper arm, upper trapezius, and scapularis region to decrease fascial restriction and increase joint mobility in a pain free zone.                    OT Short Term Goals - 01/21/20 8250      OT SHORT TERM GOAL #1   Title patient will be educated and independent with HEP in order to increase functional use of her RUE and decrease discomfort when performing daily tasks.    Time 5    Period Weeks    Status On-going    Target Date 02/17/20      OT SHORT TERM GOAL #2   Title Patient will increase her RUE P/ROM to WNL in order  to allow her to complete upper body dressing tasks with less difficulty.    Time 5    Period Weeks    Status On-going      OT SHORT TERM GOAL #3   Title Patient will report a decrease level of pain of approximately 5/10 or less when utilizing her RUE to complete waist level tasks.    Time 5    Period Weeks    Status On-going      OT SHORT TERM GOAL #4   Title Patient will decrease her RUE shoulder fascial restrictions to min amount or less in order to increase her functional mobility needed to complete daily tasks.    Time 5    Period Weeks    Status On-going      OT SHORT TERM GOAL #5   Title Patient will increase her scapular strength and stability while demonstrating overall improved seated and standing posture when attempting reaching tasks at or below shoulder at home and work.    Time 5    Period Weeks    Status On-going                    Plan - 01/28/20 1144    Clinical Impression Statement A: Patient with increased pain and tenderness to gentle touch this session and was unable to tolerate myofascial release. Utilized ES and moist heat to right upper to decrease pain level and increase tolerance to movement. Gentle stretching completed after modalities although decreased repetitions due to pain level increasing due to muscle guarding. Complete AA/ROM task at countertop using washcloth with patient able to tolerate. VC for form and technique were provided during session. Education on not overusing arm during work tasks and remainder of day.    Body Structure / Function / Physical Skills ADL;UE functional use;Fascial restriction;Pain;ROM;Mobility;Strength    Plan P: Continue with P/ROM supine. Add thumb tacks.    Consulted and Agree with Plan of Care Patient           Patient will benefit from skilled therapeutic intervention in order to improve the following deficits and impairments:   Body Structure / Function / Physical Skills: ADL,UE functional use,Fascial  restriction,Pain,ROM,Mobility,Strength       Visit Diagnosis: Stiffness of right shoulder, not elsewhere classified  Chronic right shoulder pain  Other symptoms and  signs involving the musculoskeletal system    Problem List Patient Active Problem List   Diagnosis Date Noted  . Osteoarthritis of right knee 04/03/2019  . Preventative health care 08/12/2018  . Neck pain 04/29/2015  . Degeneration of cervical intervertebral disc 09/24/2013  . Pain in joint, shoulder region 09/24/2013  . Muscle weakness (generalized) 09/24/2013  . Decreased range of motion of neck 09/24/2013   Limmie Patricia, OTR/L,CBIS  [phone]  01/28/2020, 11:47 AM  [place] [facility] [address] [place], [place], [address] Phone: [phone]   Fax:  [phone]  Name: Linda Page MRN: [id number] Date of Birth: 1950/06/26

## 2020-02-04 ENCOUNTER — Other Ambulatory Visit: Payer: Self-pay

## 2020-02-04 ENCOUNTER — Encounter (HOSPITAL_COMMUNITY): Payer: Self-pay | Admitting: Occupational Therapy

## 2020-02-04 ENCOUNTER — Ambulatory Visit (HOSPITAL_COMMUNITY): Payer: BC Managed Care – PPO | Admitting: Occupational Therapy

## 2020-02-04 DIAGNOSIS — M25611 Stiffness of right shoulder, not elsewhere classified: Secondary | ICD-10-CM

## 2020-02-04 DIAGNOSIS — R29898 Other symptoms and signs involving the musculoskeletal system: Secondary | ICD-10-CM | POA: Diagnosis not present

## 2020-02-04 DIAGNOSIS — M25511 Pain in right shoulder: Secondary | ICD-10-CM

## 2020-02-04 DIAGNOSIS — G8929 Other chronic pain: Secondary | ICD-10-CM

## 2020-02-04 NOTE — Therapy (Signed)
[facility] [address] [place], [place], [address] Phone: [phone]   Fax:  [phone]  Occupational Therapy Treatment  Patient Details  Name: Linda Page MRN: [id number] Date of Birth: December 23, 1950 Referring Provider (OT): Yolonda Kida, MD   Encounter Date: 02/04/2020   OT End of Session - 02/04/20 0945    Visit Number 4    Number of Visits 5    Date for OT Re-Evaluation 02/17/20    Authorization Type BCBS state health plan    Authorization Time Period 01/10/20-01/08/21 $52 copay No visit limit    OT Start Time 0904    OT Stop Time 0943    OT Time Calculation (min) 39 min    Activity Tolerance Patient tolerated treatment well    Behavior During Therapy [facility] for tasks assessed/performed           Past Medical History:  Diagnosis Date  . Anxiety   . Arthritis    in neck, in knee has gel shots givin in knee once a week for 3 weeks  . Depression   . Hypertension     Past Surgical History:  Procedure Laterality Date  . ANTERIOR CERVICAL DECOMP/DISCECTOMY FUSION N/A 04/29/2015   Procedure: ACDF C4-7;  Surgeon: Venita Lick, MD;  Location: [facility] OR;  Service: Orthopedics;  Laterality: N/A;  . cataract surgery     bilateral  . COLONOSCOPY WITH PROPOFOL N/A 11/05/2018   Procedure: COLONOSCOPY WITH PROPOFOL;  Surgeon: West Bali, MD;  Location: [facility];  Service: Endoscopy;  Laterality: N/A;  10:30am  . HAND SURGERY     rt  . KNEE ARTHROPLASTY Right 04/03/2019   Procedure: COMPUTER ASSISTED TOTAL KNEE ARTHROPLASTY;  Surgeon: Samson Frederic, MD;  Location: [facility] ORS;  Service: Orthopedics;  Laterality: Right;  . KNEE SURGERY     rt    There were no vitals filed for this visit.   Subjective Assessment - 02/04/20 0921    Subjective  Exercises are going fine at home. They are pretty straight forward.    Pertinent History --    Patient Stated Goals --    Currently in Pain? Yes    Pain Score 2    Progressing  to 4 during passive ROM of R shoulder   Pain Orientation Right    Pain Descriptors / Indicators Aching                        OT Treatments/Exercises (OP) - 02/04/20 0001      Exercises   Exercises Shoulder      Shoulder Exercises: Supine   Protraction PROM;5 reps    Horizontal ABduction PROM;5 reps    External Rotation PROM;5 reps    Internal Rotation PROM;5 reps    Flexion PROM;5 reps    ABduction PROM;5 reps      Shoulder Exercises: Seated   Elevation AROM;10 reps    Extension AROM;10 reps   emphasis on scauplar retraction   Retraction AROM;10 reps    Other Seated Exercises scapular depression 10x      Shoulder Exercises: Standing   Other Standing Exercises thumb tacks at low level x10      Shoulder Exercises: Pulleys   Flexion 1 minute    ABduction 1 minute      Shoulder Exercises: Therapy Ball   Flexion Right;10 reps    ABduction Right;10 reps      Shoulder Exercises: Isometric Strengthening   Flexion  Supine;3X5"    Extension Supine;3X5"    External Rotation Supine;3X5"    Internal Rotation Supine;3X5"    ABduction Supine;3X5"    ADduction Supine;3X5"      Manual Therapy   Manual Therapy Myofascial release    Manual therapy comments Manual therapy completed prior to exercises.    Myofascial Release Myofascial release and manual stretching completed to right upper arm, upper trapezius, and scapularis region to decrease fascial restriction and increase joint mobility in a pain free zone. Used to desensitize the area.                  OT Education - 02/04/20 0920    Education Details Educated on typical therapy for rotator cuff tear. Educated on purpose of manual therapy.    Person(s) Educated Patient    Methods Explanation    Comprehension Verbalized understanding            OT Short Term Goals - 01/21/20 0962      OT SHORT TERM GOAL #1   Title patient will be educated and independent with HEP in order to increase functional use  of her RUE and decrease discomfort when performing daily tasks.    Time 5    Period Weeks    Status On-going    Target Date 02/17/20      OT SHORT TERM GOAL #2   Title Patient will increase her RUE P/ROM to WNL in order to allow her to complete upper body dressing tasks with less difficulty.    Time 5    Period Weeks    Status On-going      OT SHORT TERM GOAL #3   Title Patient will report a decrease level of pain of approximately 5/10 or less when utilizing her RUE to complete waist level tasks.    Time 5    Period Weeks    Status On-going      OT SHORT TERM GOAL #4   Title Patient will decrease her RUE shoulder fascial restrictions to min amount or less in order to increase her functional mobility needed to complete daily tasks.    Time 5    Period Weeks    Status On-going      OT SHORT TERM GOAL #5   Title Patient will increase her scapular strength and stability while demonstrating overall improved seated and standing posture when attempting reaching tasks at or below shoulder at home and work.    Time 5    Period Weeks    Status On-going                    Plan - 02/04/20 0957    Clinical Impression Statement A: Pt with improved pain at beginning of session, very gentle myofascial release completed to address fascial restrictions and desensitize shoulder to tactile stimulation. Pt tolerating P/ROM with joint distraction with improvement in catching sensation. Added isometrics, thumb tacks, and pulleys today. Pt able to tolerate ROM at ~75% during pulleys. Discussed shoulder functioning and potential pros/cons of sx, answering pt questions and deferring to MD when necessary. Verbal cuing for form and technique.    Body Structure / Function / Physical Skills ADL;UE functional use;Fascial restriction;Pain;ROM;Mobility;Strength    Plan P: Continue with gentle myofascial release, attempt supine AA/ROM    OT Home Exercise Plan eval: table slides, scapular A/ROM     Consulted and Agree with Plan of Care Patient           Patient  will benefit from skilled therapeutic intervention in order to improve the following deficits and impairments:   Body Structure / Function / Physical Skills: ADL,UE functional use,Fascial restriction,Pain,ROM,Mobility,Strength       Visit Diagnosis: Stiffness of right shoulder, not elsewhere classified  Chronic right shoulder pain  Other symptoms and signs involving the musculoskeletal system    Problem List Patient Active Problem List   Diagnosis Date Noted  . Osteoarthritis of right knee 04/03/2019  . Preventative health care 08/12/2018  . Neck pain 04/29/2015  . Degeneration of cervical intervertebral disc 09/24/2013  . Pain in joint, shoulder region 09/24/2013  . Muscle weakness (generalized) 09/24/2013  . Decreased range of motion of neck 09/24/2013   Ezra Sites, OTR/L  [phone] 02/04/2020, 10:01 AM  [facility] [address] [place], [place], [address] Phone: [phone]   Fax:  [phone]  Name: CHARLENA HAUB MRN: [id number] Date of Birth: 1950/03/01

## 2020-02-11 ENCOUNTER — Ambulatory Visit (HOSPITAL_COMMUNITY): Payer: BC Managed Care – PPO | Attending: Orthopedic Surgery | Admitting: Occupational Therapy

## 2020-02-11 ENCOUNTER — Encounter (HOSPITAL_COMMUNITY): Payer: Self-pay | Admitting: Occupational Therapy

## 2020-02-11 ENCOUNTER — Other Ambulatory Visit: Payer: Self-pay

## 2020-02-11 DIAGNOSIS — M25611 Stiffness of right shoulder, not elsewhere classified: Secondary | ICD-10-CM | POA: Diagnosis not present

## 2020-02-11 DIAGNOSIS — G8929 Other chronic pain: Secondary | ICD-10-CM | POA: Diagnosis present

## 2020-02-11 DIAGNOSIS — M25511 Pain in right shoulder: Secondary | ICD-10-CM | POA: Diagnosis present

## 2020-02-11 DIAGNOSIS — R29898 Other symptoms and signs involving the musculoskeletal system: Secondary | ICD-10-CM | POA: Insufficient documentation

## 2020-02-11 NOTE — Patient Instructions (Signed)
Perform each exercise ____10-15____ reps. 2-3x days.   1) Protraction   Start by holding a wand or cane at chest height.  Next, slowly push the wand outwards in front of your body so that your elbows become fully straightened. Then, return to the original position.     2) Shoulder FLEXION   In the standing position, hold a wand/cane with both arms, palms down on both sides. Raise up the wand/cane allowing your unaffected arm to perform most of the effort. Your affected arm should be partially relaxed.      3) Internal/External ROTATION   In the standing position, hold a wand/cane with both hands keeping your elbows bent. Move your arms and wand/cane to one side.  Your affected arm should be partially relaxed while your unaffected arm performs most of the effort.       4) Shoulder ABDUCTION   While holding a wand/cane palm face up on the injured side and palm face down on the uninjured side, slowly raise up your injured arm to the side.                

## 2020-02-11 NOTE — Therapy (Addendum)
[place], [place], [address] Phone: [phone]   Fax:  [phone]  Occupational Therapy Reassessment & Treatment  Patient Details  Name: Linda Page MRN: [id number] Date of Birth: [birth year] Referring Provider (OT): Nicholes Stairs, MD   Encounter Date: 02/11/2020   OT End of Session - 02/11/20 0938     Visit Number 5    Number of Visits 5    Date for OT Re-Evaluation 02/17/20    Authorization Type BCBS state health plan    Authorization Time Period 01/10/20-01/08/21 $52 copay No visit limit    OT Start Time 0858    OT Stop Time 0937    OT Time Calculation (min) 39 min    Activity Tolerance Patient tolerated treatment well    Behavior During Therapy [facility] for tasks assessed/performed             Past Medical History:  Diagnosis Date   Anxiety    Arthritis    in neck, in knee has gel shots givin in knee once a week for 3 weeks   Depression    Hypertension     Past Surgical History:  Procedure Laterality Date   ANTERIOR CERVICAL DECOMP/DISCECTOMY FUSION N/A 04/29/2015   Procedure: ACDF C4-7;  Surgeon: Melina Schools, MD;  Location: [facility];  Service: Orthopedics;  Laterality: N/A;   cataract surgery     bilateral   COLONOSCOPY WITH PROPOFOL N/A 11/05/2018   Procedure: COLONOSCOPY WITH PROPOFOL;  Surgeon: Danie Binder, MD;  Location[facility];  Service: Endoscopy;  Laterality: N/A;  10:30am   HAND SURGERY     rt   KNEE ARTHROPLASTY Right 04/03/2019   Procedure: COMPUTER ASSISTED TOTAL KNEE ARTHROPLASTY;  Surgeon: Rod Can, MD;  Location: [facility];  Service: Orthopedics;  Laterality: Right;   KNEE SURGERY     rt    There were no vitals filed for this visit.   Subjective Assessment - 02/11/20 0856     Subjective  S: It's feeling better than it was this morning.    Currently in Pain? Yes    Pain Score 3     Pain Location Shoulder    Pain Orientation Right    Pain Descriptors  / Indicators Aching;Sore    Pain Type Chronic pain    Pain Radiating Towards upper arm    Pain Onset More than a month ago    Pain Frequency Constant    Aggravating Factors  unknown    Pain Relieving Factors pain medication    Effect of Pain on Daily Activities pt pushes through the pain    Multiple Pain Sites No                [facility] OT Assessment - 02/11/20 0855       Assessment   Medical Diagnosis possible Right RTC tear.      Precautions   Precautions None      Observation/Other Assessments   Focus on Therapeutic Outcomes (FOTO)  53/100   49/100 previous     Palpation   Palpation comment Mod fascial restrictions and muscle tenderness palpated in the right upper arm, trapezius and scapularis region.      AROM   Overall AROM Comments Assessed seated. IR/er adducted.    AROM Assessment Site Shoulder    Right/Left Shoulder Right    Right Shoulder Flexion 135 Degrees   115 previous   Right Shoulder ABduction 152 Degrees  95 previous   Right Shoulder Internal Rotation 90 Degrees   same as previous   Right Shoulder External Rotation 46 Degrees   39 previous     PROM   Overall PROM Comments Assessed supine. IR/er adducted    PROM Assessment Site Shoulder    Right/Left Shoulder Right    Right Shoulder Flexion 148 Degrees   148 previous   Right Shoulder ABduction 180 Degrees   180 previous   Right Shoulder Internal Rotation 90 Degrees   same as previous   Right Shoulder External Rotation 65 Degrees   45 previous     Strength   Overall Strength Comments Assessed seated. IR/er adducted    Strength Assessment Site Shoulder    Right/Left Shoulder Right    Right Shoulder Flexion 4-/5   3/5 previous   Right Shoulder ABduction 3+/5   3-/5 previous   Right Shoulder Internal Rotation 5/5   same as previous   Right Shoulder External Rotation 4+/5   same as previous                     OT Treatments/Exercises (OP) - 02/11/20 0920       Exercises   Exercises  Shoulder      Shoulder Exercises: Supine   Protraction PROM;5 reps    Horizontal ABduction PROM;5 reps    External Rotation PROM;5 reps    Internal Rotation PROM;5 reps    Flexion PROM;5 reps    ABduction PROM;5 reps      Shoulder Exercises: Seated   Protraction AAROM;10 reps    External Rotation AAROM;10 reps    Internal Rotation AAROM;10 reps    Flexion AAROM;10 reps    Abduction AAROM;10 reps      Shoulder Exercises: ROM/Strengthening   Wall Wash 1'      Manual Therapy   Manual Therapy Myofascial release    Manual therapy comments Manual therapy completed prior to exercises.    Myofascial Release Myofascial release and manual stretching completed to right upper arm, upper trapezius, and scapularis region to decrease fascial restriction and increase joint mobility in a pain free zone. Used to desensitize the area.                    OT Education - 02/11/20 0930     Education Details AA/ROM with exception of horizontal abduction    Person(s) Educated Patient    Methods Explanation;Demonstration;Handout    Comprehension Verbalized understanding;Returned demonstration              OT Short Term Goals - 02/11/20 0918       OT SHORT TERM GOAL #1   Title patient will be educated and independent with HEP in order to increase functional use of her RUE and decrease discomfort when performing daily tasks.    Time 5    Period Weeks    Status On-going    Target Date 02/17/20      OT SHORT TERM GOAL #2   Title Patient will increase her RUE P/ROM to WNL in order to allow her to complete upper body dressing tasks with less difficulty.    Time 5    Period Weeks    Status Partially Met      OT SHORT TERM GOAL #3   Title Patient will report a decrease level of pain of approximately 5/10 or less when utilizing her RUE to complete waist level tasks.    Time 5  Period Weeks    Status Achieved      OT SHORT TERM GOAL #4   Title Patient will decrease her RUE  shoulder fascial restrictions to min amount or less in order to increase her functional mobility needed to complete daily tasks.    Time 5    Period Weeks    Status On-going      OT SHORT TERM GOAL #5   Title Patient will increase her scapular strength and stability while demonstrating overall improved seated and standing posture when attempting reaching tasks at or below shoulder at home and work.    Time 5    Period Weeks    Status On-going                      Plan - 02/11/20 7322     Clinical Impression Statement A: Reassessment completed this session, pt has made some progress with A/ROM and strength, P/ROM has remained the same. Pt has met 1 goal, 1 additional goal partially met. Pt reports she is having some popping in her shoulder now, continues to have daily pain limiting her ADLs and functional use of the RUE. Discussed progress with pt, as well as options for therapy. Pt would like to hold therapy and discuss functioning and next steps at next MD appt coming up in the next 2 weeks. Updated HEP for AA/ROM, verbal cuing for form and only stretching until point of pain.    Body Structure / Function / Physical Skills ADL;UE functional use;Fascial restriction;Pain;ROM;Mobility;Strength    Plan P: Hold therapy until after pt sees MD. Pt will let us know if MD recommends returning or discharging having surgery    OT Home Exercise Plan eval: table slides, scapular A/ROM; 2/2: AA/ROM    Consulted and Agree with Plan of Care Patient             Patient will benefit from skilled therapeutic intervention in order to improve the following deficits and impairments:   Body Structure / Function / Physical Skills: ADL,UE functional use,Fascial restriction,Pain,ROM,Mobility,Strength       Visit Diagnosis: Stiffness of right shoulder, not elsewhere classified  Chronic right shoulder pain  Other symptoms and signs involving the musculoskeletal system    Problem  List Patient Active Problem List   Diagnosis Date Noted   Osteoarthritis of right knee 04/03/2019   Preventative health care 08/12/2018   Neck pain 04/29/2015   Degeneration of cervical intervertebral disc 09/24/2013   Pain in joint, shoulder region 09/24/2013   Muscle weakness (generalized) 09/24/2013   Decreased range of motion of neck 09/24/2013   Guadelupe Sabin, OTR/L  [phone] 02/11/2020, 9:41 AM  [place], [place], [address] Phone: [phone]   Fax:  [phone]  Name: Linda Page MRN: [id number] Date of Birth: [birth year]   07/22/2020  OCCUPATIONAL THERAPY DISCHARGE SUMMARY  Visits from Start of Care: 5  Current functional level related to goals / functional outcomes: Unknown. Pt did not follow up or return for visits after MD appt.    Remaining deficits: unknown   Education / Equipment: HEP   Patient agrees to discharge. Patient goals were partially met. Patient is being discharged due to not returning since the last visit.Marland Kitchen

## 2020-02-18 ENCOUNTER — Encounter (HOSPITAL_COMMUNITY): Payer: BC Managed Care – PPO

## 2021-03-01 ENCOUNTER — Other Ambulatory Visit: Payer: Self-pay | Admitting: Family Medicine

## 2021-03-01 ENCOUNTER — Other Ambulatory Visit (HOSPITAL_BASED_OUTPATIENT_CLINIC_OR_DEPARTMENT_OTHER): Payer: Self-pay | Admitting: Family Medicine

## 2021-03-01 ENCOUNTER — Other Ambulatory Visit (HOSPITAL_COMMUNITY): Payer: Self-pay | Admitting: Family Medicine

## 2021-03-01 DIAGNOSIS — R109 Unspecified abdominal pain: Secondary | ICD-10-CM

## 2021-03-02 ENCOUNTER — Encounter (HOSPITAL_BASED_OUTPATIENT_CLINIC_OR_DEPARTMENT_OTHER): Payer: Self-pay

## 2021-03-02 ENCOUNTER — Ambulatory Visit (HOSPITAL_BASED_OUTPATIENT_CLINIC_OR_DEPARTMENT_OTHER)
Admission: RE | Admit: 2021-03-02 | Discharge: 2021-03-02 | Disposition: A | Discharge status: Home / Self Care | Payer: Medicare PPO | Source: Ambulatory Visit | Attending: Family Medicine | Admitting: Family Medicine

## 2021-03-02 ENCOUNTER — Other Ambulatory Visit: Payer: Self-pay

## 2021-03-02 DIAGNOSIS — R109 Unspecified abdominal pain: Secondary | ICD-10-CM | POA: Diagnosis not present

## 2021-03-02 LAB — POCT I-STAT CREATININE: Creatinine, Ser: 1.1 mg/dL — ABNORMAL HIGH (ref 0.44–1.00)

## 2021-03-02 MED ORDER — IOHEXOL 300 MG/ML  SOLN
100.0000 mL | Freq: Once | INTRAMUSCULAR | Status: AC | PRN
  Administered 2021-03-02: 80 mL via INTRAVENOUS

## 2021-03-23 ENCOUNTER — Ambulatory Visit (INDEPENDENT_AMBULATORY_CARE_PROVIDER_SITE_OTHER): Payer: Medicare PPO

## 2021-03-23 ENCOUNTER — Other Ambulatory Visit: Payer: Self-pay

## 2021-03-23 ENCOUNTER — Ambulatory Visit: Payer: Medicare PPO | Admitting: Podiatry

## 2021-03-23 DIAGNOSIS — M7672 Peroneal tendinitis, left leg: Secondary | ICD-10-CM | POA: Diagnosis not present

## 2021-03-23 DIAGNOSIS — T148XXA Other injury of unspecified body region, initial encounter: Secondary | ICD-10-CM | POA: Diagnosis not present

## 2021-03-23 DIAGNOSIS — M79672 Pain in left foot: Secondary | ICD-10-CM | POA: Diagnosis not present

## 2021-03-24 NOTE — Progress Notes (Signed)
Subjective:  ? ?Patient ID: Linda Page, female   DOB: 71 y.o.   MRN: [id number]  ? ?HPI ?Patient presents stating she has had pain in her left outside foot for over 3 years.  States that she has seen several other physicians and has had cast immobilization twice for at least 8 weeks.'s several cortisone injections and several years ago MRI CT scan which was negative.  Patient states that the pain is intense especially at night and even sheets can bother her at.  Patient does not smoke likes to be active ? ? ?Review of Systems  ?All other systems reviewed and are negative. ? ? ?   ?Objective:  ?Physical Exam ?Vitals and nursing note reviewed.  ?Constitutional:   ?   Appearance: She is well-developed.  ?Pulmonary:  ?   Effort: Pulmonary effort is normal.  ?Musculoskeletal:     ?   General: Normal range of motion.  ?Skin: ?   General: Skin is warm.  ?Neurological:  ?   Mental Status: She is alert.  ?  ?Neurovascular status intact with patient found to have intense discomfort which is mostly just proximal to the insertion of the peroneal tendon into the base of the fifth metatarsal.  There is fusiform swelling in this area and it is very tender when pressed and there may be some moderate muscle dysfunction in this area.  Patient does not seem to have other pains with slight pain in the more medial dorsal side of the foot but low-grade.  Good digital perfusion well oriented ? ?   ?Assessment:  ?Appears to be some form of pathology to the peroneal tendon left even though we cannot rule out some kind of unusual nerve entrapment or possible calcaneocuboid syndrome or some other bone pathology even though its been going on for a number of years ? ?   ?Plan:  ?H&P x-rays reviewed condition discussed at great length.  At this point I do think we need to repeat the MRI and see if there is any indications of a split tear of the peroneal tendon or pathology within the bone or soft tissue in this area.  Very difficult problem  with no guarantee of resolution ? ?X-rays indicate that there does not appear to be reactivity around the base of the fifth metatarsal or any indication currently of previous fracture ?   ? ? ?

## 2021-03-31 ENCOUNTER — Other Ambulatory Visit: Payer: Self-pay

## 2021-03-31 ENCOUNTER — Ambulatory Visit (INDEPENDENT_AMBULATORY_CARE_PROVIDER_SITE_OTHER): Payer: Medicare PPO | Admitting: Adult Health

## 2021-03-31 ENCOUNTER — Other Ambulatory Visit (HOSPITAL_COMMUNITY)
Admission: RE | Admit: 2021-03-31 | Discharge: 2021-03-31 | Disposition: A | Discharge status: Home / Self Care | Payer: Medicare PPO | Source: Ambulatory Visit | Attending: Adult Health | Admitting: Adult Health

## 2021-03-31 ENCOUNTER — Encounter: Payer: Self-pay | Admitting: Adult Health

## 2021-03-31 VITALS — BP 131/76 | HR 91 | Ht 64.0 in | Wt 224.0 lb

## 2021-03-31 DIAGNOSIS — Z1211 Encounter for screening for malignant neoplasm of colon: Secondary | ICD-10-CM

## 2021-03-31 DIAGNOSIS — Z01419 Encounter for gynecological examination (general) (routine) without abnormal findings: Secondary | ICD-10-CM | POA: Insufficient documentation

## 2021-03-31 DIAGNOSIS — Z1151 Encounter for screening for human papillomavirus (HPV): Secondary | ICD-10-CM | POA: Diagnosis not present

## 2021-03-31 DIAGNOSIS — Z1231 Encounter for screening mammogram for malignant neoplasm of breast: Secondary | ICD-10-CM

## 2021-03-31 DIAGNOSIS — Z124 Encounter for screening for malignant neoplasm of cervix: Secondary | ICD-10-CM

## 2021-03-31 LAB — HEMOCCULT GUIAC POC 1CARD (OFFICE): Fecal Occult Blood, POC: NEGATIVE

## 2021-03-31 NOTE — Progress Notes (Signed)
Patient ID: Linda Page, female   DOB: 05/03/1950, 71 y.o.   MRN: [id number] ?History of Present Illness: ?Linda Page is a 71 year old white female, widowed, PM in for well woman gyn exam and pap, has not had one in over 15 years. And has never had mammogram. ?PCP is Dr Linda Page. ? ? ?Current Medications, Allergies, Past Medical History, Past Surgical History, Family History and Social History were reviewed in [employer] record.   ? ? ?Review of Systems: ?Patient denies any headaches, hearing loss, fatigue, blurred vision, shortness of breath, chest pain, abdominal pain, problems with bowel movements, urination, or intercourse(not active). No joint pain or mood swings.  ?Has pain left foot, getting MRI soon. ?Denies any vaginal bleeding. ? ?Physical Exam:BP 131/76 (BP Location: Right Arm, Patient Position: Sitting, Cuff Size: Large)   Pulse 91   Ht 5\' 4"  (1.626 m)   Wt 224 lb (101.6 kg)   BMI 38.45 kg/m?   ?General:  Well developed, well nourished, no acute distress ?Skin:  Warm and dry ?Neck:  Midline trachea, normal thyroid, good ROM, no lymphadenopathy,no carotid bruits heard ?Lungs; Clear to auscultation bilaterally ?Breast:  No dominant palpable mass, retraction, or nipple discharge ?Cardiovascular: Regular rate and rhythm ?Abdomen:  Soft, non tender, no hepatosplenomegaly ?Pelvic:  External genitalia is normal in appearance, has numerous skin tags.  The vagina is pale and atrophic.Urethra has no lesions or masses. The cervix is smooth, pap with HR HPV genotyping performed.  Uterus is felt to be normal size, shape, and contour.  No adnexal masses or tenderness noted.Bladder is non tender, no masses felt. ?Rectal: Good sphincter tone, no polyps, or hemorrhoids felt.  Hemoccult negative, has grape sized skin tag on left buttock.  ?Extremities/musculoskeletal:  No swelling or varicosities noted, no clubbing or cyanosis ?Psych:  No mood changes, alert and cooperative,seems happy ?AA is 0 ?Fall  risk is low ? ?  03/31/2021  ? 10:25 AM  ?Depression screen PHQ 2/9  ?Decreased Interest 0  ?Down, Depressed, Hopeless 0  ?PHQ - 2 Score 0  ?Altered sleeping 0  ?Tired, decreased energy 0  ?Change in appetite 0  ?Feeling bad or failure about yourself  0  ?Trouble concentrating 0  ?Moving slowly or fidgety/restless 0  ?Suicidal thoughts 0  ?PHQ-9 Score 0  ?  ? ?  03/31/2021  ? 10:25 AM  ?GAD 7 : Generalized Anxiety Score  ?Nervous, Anxious, on Edge 0  ?Control/stop worrying 0  ?Worry too much - different things 0  ?Trouble relaxing 0  ?Restless 0  ?Easily annoyed or irritable 0  ?Afraid - awful might happen 0  ?Total GAD 7 Score 0  ? ?  ? [place]stream - 03/31/21 1025   ? ?  ? Pregnancy Intention Screening  ? Does the patient want to become pregnant in the next year? N/A   ? Does the patient's partner want to become pregnant in the next year? N/A   ? Would the patient like to discuss contraceptive options today? N/A   ?  ? Contraception Wrap Up  ? Current Method --   PM  ? End Method --   PM  ? Contraception Counseling Provided No   ? ?  ?  ? ?  ? Examination chaperoned by 04/02/21 LPN ? ? ?Impression and Plan: ?1. Encounter for gynecological examination with Papanicolaou smear of cervix ?Pap sent ?GYN  physical in 2 year ?Labs with PCP ?Colonoscopy per GI ?- Cytology - PAP(  Linda Page) ? ?2. Encounter for screening fecal occult blood testing ?- POCT occult blood stool ? ?3. Screening mammogram for breast cancer ?Mammogram scheduled for her 04/11/21 at 7 am at [facility]  ?- MM 3D SCREEN BREAST BILATERAL; Future  ? ? ? ?  ?  ?

## 2021-04-01 ENCOUNTER — Other Ambulatory Visit: Payer: Self-pay | Admitting: Podiatry

## 2021-04-01 DIAGNOSIS — M7672 Peroneal tendinitis, left leg: Secondary | ICD-10-CM

## 2021-04-05 LAB — CYTOLOGY - PAP
Adequacy: ABSENT
Comment: NEGATIVE
Comment: NEGATIVE
Comment: NEGATIVE
Diagnosis: NEGATIVE
HPV 16: NEGATIVE
HPV 18 / 45: NEGATIVE
High risk HPV: POSITIVE — AB

## 2021-04-06 ENCOUNTER — Other Ambulatory Visit: Payer: Self-pay

## 2021-04-06 ENCOUNTER — Ambulatory Visit
Admission: RE | Admit: 2021-04-06 | Discharge: 2021-04-06 | Disposition: A | Discharge status: Home / Self Care | Payer: Medicare PPO | Source: Ambulatory Visit | Attending: Podiatry | Admitting: Podiatry

## 2021-04-06 ENCOUNTER — Other Ambulatory Visit: Payer: Self-pay | Admitting: Adult Health

## 2021-04-06 ENCOUNTER — Encounter: Payer: Self-pay | Admitting: Adult Health

## 2021-04-06 DIAGNOSIS — T148XXA Other injury of unspecified body region, initial encounter: Secondary | ICD-10-CM

## 2021-04-06 DIAGNOSIS — R87810 Cervical high risk human papillomavirus (HPV) DNA test positive: Secondary | ICD-10-CM | POA: Insufficient documentation

## 2021-04-06 DIAGNOSIS — N760 Acute vaginitis: Secondary | ICD-10-CM | POA: Insufficient documentation

## 2021-04-06 HISTORY — DX: Other specified bacterial agents as the cause of diseases classified elsewhere: N76.0

## 2021-04-06 HISTORY — DX: Cervical high risk human papillomavirus (HPV) DNA test positive: R87.810

## 2021-04-06 MED ORDER — METRONIDAZOLE 500 MG PO TABS: 500.0000 mg | ORAL_TABLET | Freq: Two times a day (BID) | ORAL | 0 refills | Status: DC

## 2021-04-06 NOTE — Progress Notes (Signed)
+  BV on pap will rx flagyl no alcohol during treatment  ?

## 2021-04-11 ENCOUNTER — Telehealth: Payer: Self-pay

## 2021-04-11 ENCOUNTER — Ambulatory Visit (HOSPITAL_COMMUNITY)
Admission: RE | Admit: 2021-04-11 | Discharge: 2021-04-11 | Disposition: A | Discharge status: Home / Self Care | Payer: Medicare PPO | Source: Ambulatory Visit | Attending: Adult Health | Admitting: Adult Health

## 2021-04-11 DIAGNOSIS — Z1231 Encounter for screening mammogram for malignant neoplasm of breast: Secondary | ICD-10-CM | POA: Diagnosis not present

## 2021-04-11 NOTE — Telephone Encounter (Signed)
PT CALLED AND STATED THAT SHE WOULD LIKE TO SPEAK WITH JENNIFER. WOULD NOT SAY WHAT IS WAS CONCERNING. ?

## 2021-04-12 ENCOUNTER — Other Ambulatory Visit (HOSPITAL_COMMUNITY): Payer: Self-pay | Admitting: Adult Health

## 2021-04-12 DIAGNOSIS — R928 Other abnormal and inconclusive findings on diagnostic imaging of breast: Secondary | ICD-10-CM

## 2021-04-12 NOTE — Telephone Encounter (Signed)
Pt aware of mammogram and need for follow up, I told her she could call if she wanted vs waiting for them to call. ?Also discussed pap and +HPV, repeat pap in 1 year  ?

## 2021-04-13 ENCOUNTER — Ambulatory Visit: Payer: Medicare PPO | Admitting: Podiatry

## 2021-04-13 ENCOUNTER — Encounter: Payer: Self-pay | Admitting: Podiatry

## 2021-04-13 DIAGNOSIS — M7672 Peroneal tendinitis, left leg: Secondary | ICD-10-CM

## 2021-04-13 DIAGNOSIS — T148XXA Other injury of unspecified body region, initial encounter: Secondary | ICD-10-CM

## 2021-04-13 MED ORDER — TRIAMCINOLONE ACETONIDE 10 MG/ML IJ SUSP
10.0000 mg | Freq: Once | INTRAMUSCULAR | Status: AC
  Administered 2021-04-13: 10 mg

## 2021-04-13 NOTE — Progress Notes (Signed)
Subjective:  ? ?Patient ID: Linda Page, female   DOB: 71 y.o.   MRN: [id number]  ? ?HPI ?Patient states still having the same pain that is been present for 3 years with history of injections and immobilization and is here for MRI results and to do what we can do ? ? ?ROS ? ? ?   ?Objective:  ?Physical Exam  ?Neurovascular status intact with continuation of inflammation lateral compartment left peroneal at its insertion navicular and slightly proximal ? ?   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 ?   ?Plan:  ?Reviewed condition and recommended at this point conservative with the possibility that we will get a need to do exploratory surgery of the area.  I went ahead today did sterile prep and did sheath injection peroneal 3 mg Dexasone Kenalog 5 mg Xylocaine at insertion.  I want to see how much relief this gives her and how long and that can dictate whether or not we can do exploratory peritoneal surgery.  MRI does show inflammation of the peroneal tendon group and we will make a decision as to whether or not there could be some form of interstitial tear formation that we cannot appreciate ?   ? ? ?

## 2021-04-19 ENCOUNTER — Ambulatory Visit (HOSPITAL_COMMUNITY)
Admission: RE | Admit: 2021-04-19 | Discharge: 2021-04-19 | Disposition: A | Discharge status: Home / Self Care | Payer: Medicare PPO | Source: Ambulatory Visit | Attending: Adult Health | Admitting: Adult Health

## 2021-04-19 DIAGNOSIS — N6489 Other specified disorders of breast: Secondary | ICD-10-CM | POA: Diagnosis not present

## 2021-04-19 DIAGNOSIS — R928 Other abnormal and inconclusive findings on diagnostic imaging of breast: Secondary | ICD-10-CM | POA: Insufficient documentation

## 2021-04-19 DIAGNOSIS — R922 Inconclusive mammogram: Secondary | ICD-10-CM | POA: Diagnosis not present

## 2021-05-04 DIAGNOSIS — L308 Other specified dermatitis: Secondary | ICD-10-CM | POA: Diagnosis not present

## 2021-05-04 DIAGNOSIS — D2239 Melanocytic nevi of other parts of face: Secondary | ICD-10-CM | POA: Diagnosis not present

## 2021-05-04 DIAGNOSIS — L820 Inflamed seborrheic keratosis: Secondary | ICD-10-CM | POA: Diagnosis not present

## 2021-05-05 ENCOUNTER — Encounter (HOSPITAL_COMMUNITY): Payer: Medicare PPO

## 2021-05-05 ENCOUNTER — Other Ambulatory Visit (HOSPITAL_COMMUNITY): Payer: Medicare PPO

## 2021-05-15 ENCOUNTER — Ambulatory Visit (HOSPITAL_COMMUNITY): Payer: Medicare PPO | Admitting: Anesthesiology

## 2021-05-15 ENCOUNTER — Encounter (HOSPITAL_COMMUNITY)
Admission: EM | Disposition: A | Discharge status: Home / Self Care | Payer: Self-pay | Source: Home / Self Care | Attending: Emergency Medicine

## 2021-05-15 ENCOUNTER — Other Ambulatory Visit: Payer: Self-pay

## 2021-05-15 ENCOUNTER — Telehealth (INDEPENDENT_AMBULATORY_CARE_PROVIDER_SITE_OTHER): Payer: Self-pay | Admitting: Gastroenterology

## 2021-05-15 ENCOUNTER — Encounter (HOSPITAL_COMMUNITY): Payer: Self-pay | Admitting: Emergency Medicine

## 2021-05-15 ENCOUNTER — Ambulatory Visit (HOSPITAL_COMMUNITY)
Admission: EM | Admit: 2021-05-15 | Discharge: 2021-05-16 | Disposition: A | Discharge status: Home / Self Care | Payer: Medicare PPO | Attending: Emergency Medicine | Admitting: Emergency Medicine

## 2021-05-15 ENCOUNTER — Emergency Department (HOSPITAL_COMMUNITY): Payer: Medicare PPO

## 2021-05-15 DIAGNOSIS — K259 Gastric ulcer, unspecified as acute or chronic, without hemorrhage or perforation: Secondary | ICD-10-CM | POA: Diagnosis not present

## 2021-05-15 DIAGNOSIS — N281 Cyst of kidney, acquired: Secondary | ICD-10-CM | POA: Diagnosis not present

## 2021-05-15 DIAGNOSIS — R1010 Upper abdominal pain, unspecified: Secondary | ICD-10-CM | POA: Diagnosis not present

## 2021-05-15 DIAGNOSIS — I10 Essential (primary) hypertension: Secondary | ICD-10-CM | POA: Insufficient documentation

## 2021-05-15 DIAGNOSIS — F419 Anxiety disorder, unspecified: Secondary | ICD-10-CM | POA: Diagnosis not present

## 2021-05-15 DIAGNOSIS — T189XXA Foreign body of alimentary tract, part unspecified, initial encounter: Secondary | ICD-10-CM | POA: Insufficient documentation

## 2021-05-15 DIAGNOSIS — X58XXXA Exposure to other specified factors, initial encounter: Secondary | ICD-10-CM | POA: Insufficient documentation

## 2021-05-15 DIAGNOSIS — Z79899 Other long term (current) drug therapy: Secondary | ICD-10-CM | POA: Insufficient documentation

## 2021-05-15 DIAGNOSIS — R1032 Left lower quadrant pain: Secondary | ICD-10-CM | POA: Diagnosis not present

## 2021-05-15 DIAGNOSIS — R933 Abnormal findings on diagnostic imaging of other parts of digestive tract: Secondary | ICD-10-CM | POA: Insufficient documentation

## 2021-05-15 DIAGNOSIS — D649 Anemia, unspecified: Secondary | ICD-10-CM | POA: Diagnosis not present

## 2021-05-15 DIAGNOSIS — T182XXA Foreign body in stomach, initial encounter: Secondary | ICD-10-CM | POA: Diagnosis not present

## 2021-05-15 DIAGNOSIS — Z981 Arthrodesis status: Secondary | ICD-10-CM | POA: Insufficient documentation

## 2021-05-15 DIAGNOSIS — I700 Atherosclerosis of aorta: Secondary | ICD-10-CM | POA: Diagnosis not present

## 2021-05-15 DIAGNOSIS — K3189 Other diseases of stomach and duodenum: Secondary | ICD-10-CM | POA: Insufficient documentation

## 2021-05-15 DIAGNOSIS — K5730 Diverticulosis of large intestine without perforation or abscess without bleeding: Secondary | ICD-10-CM | POA: Diagnosis not present

## 2021-05-15 DIAGNOSIS — F32A Depression, unspecified: Secondary | ICD-10-CM | POA: Insufficient documentation

## 2021-05-15 DIAGNOSIS — R1012 Left upper quadrant pain: Secondary | ICD-10-CM | POA: Insufficient documentation

## 2021-05-15 DIAGNOSIS — R0989 Other specified symptoms and signs involving the circulatory and respiratory systems: Secondary | ICD-10-CM | POA: Diagnosis not present

## 2021-05-15 DIAGNOSIS — K429 Umbilical hernia without obstruction or gangrene: Secondary | ICD-10-CM | POA: Diagnosis not present

## 2021-05-15 DIAGNOSIS — D259 Leiomyoma of uterus, unspecified: Secondary | ICD-10-CM | POA: Diagnosis not present

## 2021-05-15 HISTORY — PX: ESOPHAGOGASTRODUODENOSCOPY (EGD) WITH PROPOFOL: SHX5813

## 2021-05-15 LAB — CBC
HCT: 36.7 % (ref 36.0–46.0)
Hemoglobin: 11.5 g/dL — ABNORMAL LOW (ref 12.0–15.0)
MCH: 29.6 pg (ref 26.0–34.0)
MCHC: 31.3 g/dL (ref 30.0–36.0)
MCV: 94.6 fL (ref 80.0–100.0)
Platelets: 276 10*3/uL (ref 150–400)
RBC: 3.88 MIL/uL (ref 3.87–5.11)
RDW: 13.2 % (ref 11.5–15.5)
WBC: 9 10*3/uL (ref 4.0–10.5)
nRBC: 0 % (ref 0.0–0.2)

## 2021-05-15 LAB — BASIC METABOLIC PANEL
Anion gap: 6 (ref 5–15)
BUN: 21 mg/dL (ref 8–23)
CO2: 30 mmol/L (ref 22–32)
Calcium: 9.5 mg/dL (ref 8.9–10.3)
Chloride: 105 mmol/L (ref 98–111)
Creatinine, Ser: 0.87 mg/dL (ref 0.44–1.00)
GFR, Estimated: >60 mL/min (ref >60)
Glucose, Bld: 107 mg/dL — ABNORMAL HIGH (ref 70–99)
Potassium: 3.8 mmol/L (ref 3.5–5.1)
Sodium: 141 mmol/L (ref 135–145)

## 2021-05-15 SURGERY — ESOPHAGOGASTRODUODENOSCOPY (EGD) WITH PROPOFOL
Anesthesia: General

## 2021-05-15 MED ORDER — SODIUM CHLORIDE 0.9 % IV BOLUS
500.0000 mL | Freq: Once | INTRAVENOUS | Status: AC
  Administered 2021-05-15: 500 mL via INTRAVENOUS

## 2021-05-15 MED ORDER — SODIUM CHLORIDE 0.9 % IV SOLN: INTRAVENOUS | Status: DC

## 2021-05-15 MED ORDER — PROPOFOL 10 MG/ML IV BOLUS
INTRAVENOUS | Status: AC
  Filled 2021-05-15: qty 20

## 2021-05-15 MED ORDER — PROPOFOL 10 MG/ML IV BOLUS
INTRAVENOUS | Status: DC | PRN
  Administered 2021-05-15: 250 mg via INTRAVENOUS

## 2021-05-15 NOTE — Op Note (Signed)
[facility] ?Patient Name: Linda Page ?Procedure Date: 05/15/2021 6:23 PM ?MRN: [id number] ?Date of Birth: September 23, 1950 ?Attending MD: [name] ,  ?CSN: [id number] ?Age: 71 ?Admit Type: Inpatient ?Procedure:                Upper GI endoscopy ?Indications:              Foreign body in the GI tract ?Providers:                [name], Lurline Del, RN, Wynonia Musty  ?                          Tech, Technician ?Referring MD:              ?Medicines:                Monitored Anesthesia Care ?Complications:            No immediate complications. ?Estimated Blood Loss:     Estimated blood loss: none. ?Procedure:                Pre-Anesthesia Assessment: ?                          - Prior to the procedure, a History and Physical  ?                          was performed, and patient medications, allergies  ?                          and sensitivities were reviewed. The patient's  ?                          tolerance of previous anesthesia was reviewed. ?                          - The risks and benefits of the procedure and the  ?                          sedation options and risks were discussed with the  ?                          patient. All questions were answered and informed  ?                          consent was obtained. ?                          After obtaining informed consent, the endoscope was  ?                          passed under direct vision. Throughout the  ?                          procedure, the patient's blood pressure, pulse, and  ?                          oxygen saturations were monitored  continuously. The  ?                          [id number] scope was introduced through the  ?                          mouth, and advanced to the second part of duodenum.  ?                          The upper GI endoscopy was accomplished without  ?                          difficulty. The patient tolerated the procedure  ?                          well. ?Scope In: 6:45:12  PM ?Scope Out: 6:49:27 PM ?Total Procedure Duration: 0 hours 4 minutes 15 seconds  ?Findings: ?     The examined esophagus was normal. No erosions were seen. ?     A large amount of food (residue) was found in the gastric body. There  ?     was no discernable toothpick in the bezoar, but it could not be  ?     completely evaluated given the amount of food located in the stomach.  ?     Food was not removed given concerns of unintentionally removing a buried  ?     toothpick and damaging the esophagus. ?     The examined duodenum was normal. ?Impression:               - Normal esophagus. ?                          - A large amount of food (residue) in the stomach. ?                          - Normal examined duodenum. ?                          - No specimens collected. ?Moderate Sedation: ?     Per Anesthesia Care ?Recommendation:           - Discharge patient to home (ambulatory). ?                          - Soft diet for 3 days. ?                          - If severe abdominal pain, nausea or vomiting,  ?                          need to call to the office STAT or go to the ER to  ?                          be evaluated. ?                          - Follow up in GI clinic  in 1 week. ?Procedure Code(s):        --- Professional --- ?                          [phone], Esophagogastroduodenoscopy, flexible,  ?                          transoral; diagnostic, including collection of  ?                          specimen(s) by brushing or washing, when performed  ?                          (separate procedure) ?Diagnosis Code(s):        --- Professional --- ?                          [id number], Foreign body of alimentary tract, part  ?                          unspecified, initial encounter ?CPT copyright 2019 American Medical Association. All rights reserved. ?The codes documented in this report are preliminary and upon coder review may  ?be revised to meet current compliance requirements. ?[name], MD ?[name],  ?05/15/2021 6:58:54 PM ?This report has been signed electronically. ?Number of Addenda: 0 ?

## 2021-05-15 NOTE — Telephone Encounter (Signed)
Hi Mitzie, ? ?Can you please schedule a follow up appointment for this patient in 1 week with [facility] (formed Dr. Darrick Penna patient)? Dx: foreign body - toothpick ? ?Thanks, ? ?Katrinka Blazing, MD ?Gastroenterology and Hepatology ?[facility] ? ?

## 2021-05-15 NOTE — ED Provider Notes (Signed)
?[facility] ?Provider Note ? ? ?CSN: [id number] ?Arrival date & time: 05/15/21  1523 ? ?  ? ?History ? ?Chief Complaint  ?Patient presents with  ? Foreign Body  ? ? ?LEEZA HEINER is a 71 y.o. female. ? ? ?Foreign Body ? ?This patient is a 71 year old female, she has a history of hypertension and depression, she takes lisinopril hydrochlorothiazide and sertraline.  She presents to the hospital today after accidentally swallowing a portion of a toothpick.  She states that as she was swallowing it she felt it caused some pain to the right side of her neck, it seems to go all the way down and she was able to drink fluids and eat bread afterwards which is gone down but she still has an ongoing pain in that area.  No vomiting no difficulty breathing, no chest pain, no back pain.  She has never had any difficulties like this in the past ? ?Home Medications ?Prior to Admission medications   ?Medication Sig Start Date End Date Taking? Authorizing Provider  ?ALPRAZolam (XANAX) 1 MG tablet Take 1 mg by mouth at bedtime.    [provider]  ?atorvastatin (LIPITOR) 20 MG tablet Take 20 mg by mouth at bedtime.     [provider]  ?lisinopril-hydrochlorothiazide (PRINZIDE,ZESTORETIC) 20-12.5 MG tablet Take 1 tablet by mouth at bedtime.     [provider]  ?metroNIDAZOLE (FLAGYL) 500 MG tablet Take 1 tablet (500 mg total) by mouth 2 (two) times daily. 04/06/21   Adline Potter, NP  ?sertraline (ZOLOFT) 100 MG tablet Take 150 mg by mouth at bedtime.     [provider]  ?   ? ?Allergies    ?Codeine and Other   ? ?Review of Systems   ?Review of Systems  ?All other systems reviewed and are negative. ? ?Physical Exam ?Updated Vital Signs ?BP (!) 159/84 (BP Location: Right Arm)   Pulse (!) 101   Temp 98.1 ?F (36.7 ?C) (Oral)   Resp 18   Ht 1.626 m (5\' 4" )   Wt 99.8 kg   SpO2 100%   BMI 37.76 kg/m?  ?Physical Exam ?Vitals and nursing note reviewed.  ?Constitutional:    ?   General: She is not in acute distress. ?   Appearance: She is well-developed.  ?HENT:  ?   Head: Normocephalic and atraumatic.  ?   Mouth/Throat:  ?   Pharynx: No oropharyngeal exudate.  ?   Comments: Clear oropharynx, no foreign bodies, no bleeding no swelling ?Eyes:  ?   General: No scleral icterus.    ?   Right eye: No discharge.     ?   Left eye: No discharge.  ?   Conjunctiva/sclera: Conjunctivae normal.  ?   Pupils: Pupils are equal, round, and reactive to light.  ?Neck:  ?   Thyroid: No thyromegaly.  ?   Vascular: No JVD.  ?Cardiovascular:  ?   Rate and Rhythm: Normal rate and regular rhythm.  ?   Heart sounds: Normal heart sounds. No murmur heard. ?  No friction rub. No gallop.  ?Pulmonary:  ?   Effort: Pulmonary effort is normal. No respiratory distress.  ?   Breath sounds: Normal breath sounds. No wheezing or rales.  ?Abdominal:  ?   General: Bowel sounds are normal. There is no distension.  ?   Palpations: Abdomen is soft. There is no mass.  ?   Tenderness: There is no abdominal tenderness.  ?Musculoskeletal:     ?  General: No tenderness. Normal range of motion.  ?   Cervical back: Normal range of motion and neck supple.  ?Lymphadenopathy:  ?   Cervical: No cervical adenopathy.  ?Skin: ?   General: Skin is warm and dry.  ?   Findings: No erythema or rash.  ?Neurological:  ?   Mental Status: She is alert.  ?   Coordination: Coordination normal.  ?Psychiatric:     ?   Behavior: Behavior normal.  ? ? ?ED Results / Procedures / Treatments   ?Labs ?(all labs ordered are listed, but only abnormal results are displayed) ?Labs Reviewed  ?CBC  ?BASIC METABOLIC PANEL  ? ? ?EKG ?None ? ?Radiology ?DG Chest 2 View ? ?Result Date: 05/15/2021 ?CLINICAL DATA:  Sensation of something stuck in throat after eating steak which had a piece of toothpick in it today. EXAM: CHEST - 2 VIEW COMPARISON:  None Available. FINDINGS: Lungs are adequately inflated without consolidation or effusion. Cardiomediastinal silhouette is  normal. Fusion hardware over the anterior cervical spine intact. IMPRESSION: No acute cardiopulmonary disease. Electronically Signed   By: Elberta Fortis M.D.   On: 05/15/2021 16:43   ? ?Procedures ?Procedures  ? ? ?Medications Ordered in ED ?Medications  ?sodium chloride 0.9 % bolus 500 mL (has no administration in time range)  ? ? ?ED Course/ Medical Decision Making/ A&P ?  ?                        ?Medical Decision Making ?Amount and/or Complexity of Data Reviewed ?Labs: ordered. ?Radiology: ordered. ? ? ?This patient presents to the ED for concern of retained foreign body or esophageal injury differential diagnosis includes possible puncture of the esophagus or mediastinal injury, could just be a scratch, could be a foreign body though patient is able to swallow without difficulty at this time, phonation is normal ? ? ? ?Additional history obtained: ? ?Additional history obtained from electronic medical record ?External records from outside source obtained and reviewed including prior colonoscopy from 2020, she has never had an endoscopy ? ? ?Lab Tests: ? ?I Ordered, and personally interpreted labs.  The pertinent results include: Preop labs ordered ? ? ?Imaging Studies ordered: ? ?I ordered imaging studies including 2 view chest x-ray ?I independently visualized and interpreted imaging which showed no abnormal mediastinum, no obvious foreign bodies ?I agree with the radiologist interpretation ? ?Consultation: ? ?Care was discussed with on-call gastroenterologist Dr. Levon Hedger who will take the patient to endoscopy to make sure there is not a foreign body or puncture there.  He recommends against CT scan at this time and would rather do endoscopy ? ?Medicines ordered and prescription drug management: ? ?Saline lock and gentle IV fluids ordered, patient was kept n.p.o. throughout emergency department stay ? ? ?Problem List / ED Course: ? ?Swallowed foreign body, may or may not have injured the esophageal  structures, will go to endoscopy ? ? ?Social Determinants of Health: ? ?None ? ? ? ? ? ? ? ? ? ? ?Final Clinical Impression(s) / ED Diagnoses ?Final diagnoses:  ?Swallowed foreign body, initial encounter  ? ? ?Rx / DC Orders ?ED Discharge Orders   ? ? None  ? ?  ? ? ?  ?Eber Hong, MD ?05/15/21 1725 ? ?

## 2021-05-15 NOTE — Consult Note (Signed)
Linda Page, M.D. ?Gastroenterology & Hepatology ? ?                                        ? ?Patient Name: Linda Page Account #: _0 @   ?MRN: [id number] Admission Date: 05/15/2021 ?Date of Evaluation:  05/15/2021 Time of Evaluation: 6:09 PM ? ? ?Referring Physician: Noemi Chapel, MD ? ?Chief Complaint:  foreign body - toothpick ? ?HPI:  This is a 71 y.o. female with history of anxiety, depression, hypertension, bacterial vaginosis, spinal fusion, who comes to the hospital for evaluation after swallowing steak with a toothpick today. The patient reports she was eating lunch around 1230 PM today and she felt the food got stuck in the retrosternal area.  She was eating steak which had toothpicks.  She vomited the contents and states that one of the toothpicks came in its entirety but the second one was broken.  Since then she has presented discomfort in her throat but no shortness of breath, dysphagia, chest pain, nausea, vomiting, abdominal pain, fever, chills, induration of her neck.  After this, she tried to eat bread to make the discomfort go away but this did not help with her symptoms. ? ?Drooling: No ?Able to swallow food: Yes ?Able to drink liquids"yes ?Previous reflux symptoms: No ?Weight changes: No ? ?Previous episodes: Never ? ?Previous EGD: Never ? ?In the ER, his vital signs were stable. She was protecting his airway. Labs were notable for normal BMP, hemoglobin was mildly decreased down to 11.5 with MCV of 94.  She underwent the following imaging -chest x-ray which was within normal limits. ? ? ?Past Medical History: SEE CHRONIC ISSSUES: ?Past Medical History:  ?Diagnosis Date  ? Anxiety   ? Arthritis   ? in neck, in knee has gel shots givin in knee once a week for 3 weeks  ? BV (bacterial vaginosis) 04/06/2021  ? /329/23 rx flagyl  ? 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 Repeat pap in 1 year per ASCCP guidelines 5 year risk for CIN3+ 4.8%  ? Depression   ?  Hypertension   ? ?Past Surgical History:  ?Past Surgical History:  ?Procedure Laterality Date  ? ANTERIOR CERVICAL DECOMP/DISCECTOMY FUSION N/A 04/29/2015  ? Procedure: ACDF C4-7;  Surgeon: Melina Schools, MD;  Location: [facility];  Service: Orthopedics;  Laterality: N/A;  ? cataract surgery    ? bilateral  ? COLONOSCOPY WITH PROPOFOL N/A 11/05/2018  ? Procedure: COLONOSCOPY WITH PROPOFOL;  Surgeon: Danie Binder, MD;  Location: [facility];  Service: Endoscopy;  Laterality: N/A;  10:30am  ? HAND SURGERY    ? rt  ? KNEE ARTHROPLASTY Right 04/03/2019  ? Procedure: COMPUTER ASSISTED TOTAL KNEE ARTHROPLASTY;  Surgeon: Rod Can, MD;  Location: [facility];  Service: Orthopedics;  Laterality: Right;  ? KNEE SURGERY    ? rt  ? ?Family History:  ?Family History  ?Problem Relation Age of Onset  ? Kidney disease Father   ? Brain cancer Mother   ? Colon polyps Brother   ?     age 42  ? Colon cancer Neg Hx   ? ?Social History:  ?Social History  ? ?Tobacco Use  ? Smoking status: Never  ? Smokeless tobacco: Never  ?Vaping Use  ? Vaping Use: Never used  ?Substance Use Topics  ? Alcohol use: No  ? Drug use: No  ? ? ?  Home Medications:  ?Prior to Admission medications   ?Medication Sig Start Date End Date Taking? Authorizing Provider  ?ALPRAZolam (XANAX) 1 MG tablet Take 1 mg by mouth at bedtime.    [provider]  ?atorvastatin (LIPITOR) 20 MG tablet Take 20 mg by mouth at bedtime.     [provider]  ?lisinopril (ZESTRIL) 10 MG tablet Take 15 mg by mouth daily. 05/10/21   [provider]  ?lisinopril-hydrochlorothiazide (PRINZIDE,ZESTORETIC) 20-12.5 MG tablet Take 1 tablet by mouth at bedtime.     [provider]  ?metroNIDAZOLE (FLAGYL) 500 MG tablet Take 1 tablet (500 mg total) by mouth 2 (two) times daily. 04/06/21   Estill Dooms, NP  ?sertraline (ZOLOFT) 100 MG tablet Take 150 mg by mouth at bedtime.     [provider]  ?  ?Inpatient Medications: No current  facility-administered medications for this encounter. ? ?Current Outpatient Medications:  ?  ALPRAZolam (XANAX) 1 MG tablet, Take 1 mg by mouth at bedtime., Disp: , Rfl:  ?  atorvastatin (LIPITOR) 20 MG tablet, Take 20 mg by mouth at bedtime. , Disp: , Rfl:  ?  lisinopril (ZESTRIL) 10 MG tablet, Take 15 mg by mouth daily., Disp: , Rfl:  ?  lisinopril-hydrochlorothiazide (PRINZIDE,ZESTORETIC) 20-12.5 MG tablet, Take 1 tablet by mouth at bedtime. , Disp: , Rfl:  ?  metroNIDAZOLE (FLAGYL) 500 MG tablet, Take 1 tablet (500 mg total) by mouth 2 (two) times daily., Disp: 14 tablet, Rfl: 0 ?  sertraline (ZOLOFT) 100 MG tablet, Take 150 mg by mouth at bedtime. , Disp: , Rfl:  ?Allergies: Codeine and Other ? ?Complete Review of Systems: ?GENERAL: negative for malaise, night sweats ?HEENT: No changes in hearing or vision, no nose bleeds or other nasal problems. ?NECK: Negative for lumps, goiter, pain and significant neck swelling ?RESPIRATORY: Negative for cough, wheezing ?CARDIOVASCULAR: Negative for chest pain, leg swelling, palpitations, orthopnea ?GI: SEE HPI ?MUSCULOSKELETAL: Negative for joint pain or swelling, back pain, and muscle pain. ?SKIN: Negative for lesions, rash ?PSYCH: Negative for sleep disturbance, mood disorder and recent psychosocial stressors. ?HEMATOLOGY Negative for prolonged bleeding, bruising easily, and swollen nodes. ?ENDOCRINE: Negative for cold or heat intolerance, polyuria, polydipsia and goiter. ?NEURO: negative for tremor, gait imbalance, syncope and seizures. ?The remainder of the review of systems is noncontributory. ? ?Physical Exam: ?BP (!) 143/92   Pulse 86   Temp 98.4 ?F (36.9 ?C) (Oral)   Resp 18   Ht 5' 4" (1.626 m)   Wt 99.8 kg   SpO2 95%   BMI 37.76 kg/m?  ?GENERAL: The patient is AO x3, in no acute distress. Obese. ?HEENT: Head is normocephalic and atraumatic. EOMI are intact. Mouth is well hydrated and without lesions. ?NECK: Supple. No masses ?LUNGS: Clear to auscultation.  No presence of rhonchi/wheezing/rales. Adequate chest expansion ?HEART: RRR, normal s1 and s2. ?ABDOMEN: Soft, nontender, no guarding, no peritoneal signs, and nondistended. BS +. No masses. ?EXTREMITIES: Without any cyanosis, clubbing, rash, lesions or edema. ?NEUROLOGIC: AOx3, no focal motor deficit. ?SKIN: no jaundice, no rashes ? ?Laboratory Data ?CBC:  ?   ?Component Value Date/Time  ? WBC 9.0 05/15/2021 1704  ? RBC 3.88 05/15/2021 1704  ? HGB 11.5 (L) 05/15/2021 1704  ? HCT 36.7 05/15/2021 1704  ? PLT 276 05/15/2021 1704  ? MCV 94.6 05/15/2021 1704  ? MCH 29.6 05/15/2021 1704  ? MCHC 31.3 05/15/2021 1704  ? RDW 13.2 05/15/2021 1704  ? ?COAG:  ?Lab Results  ?Component Value Date  ?  INR 1.0 03/27/2019  ?  ?BMP:  ? ?  Latest Ref Rng & Units 05/15/2021  ?  5:04 PM 03/02/2021  ?  9:47 AM 04/04/2019  ?  3:18 AM  ?BMP  ?Glucose 70 - 99 mg/dL 107    155    ?BUN 8 - 23 mg/dL 21    31    ?Creatinine 0.44 - 1.00 mg/dL 0.87   1.10   0.89    ?Sodium 135 - 145 mmol/L 141    139    ?Potassium 3.5 - 5.1 mmol/L 3.8    4.1    ?Chloride 98 - 111 mmol/L 105    108    ?CO2 22 - 32 mmol/L 30    24    ?Calcium 8.9 - 10.3 mg/dL 9.5    8.2    ?  ?HEPATIC:  ? ?  Latest Ref Rng & Units 03/27/2019  ?  4:10 PM  ?Hepatic Function  ?Total Protein 6.5 - 8.1 g/dL 8.2    ?Albumin 3.5 - 5.0 g/dL 4.5    ?AST 15 - 41 U/L 27    ?ALT 0 - 44 U/L 34    ?Alk Phosphatase 38 - 126 U/L 60    ?Total Bilirubin 0.3 - 1.2 mg/dL 0.5    ?  ?CARDIAC: No results found for: CKTOTAL, CKMB, CKMBINDEX, TROPONINI  ? ?Imaging: ?I personally reviewed and interpreted the available imaging. ? ?Assessment & Plan: ?ALYSSAMARIE MOUNSEY is a 71 y.o. female with history of anxiety, depression, hypertension, bacterial vaginosis, spinal fusion, who comes to the hospital for evaluation after swallowing steak with a toothpick today.  The patient has been able to tolerate oral intake since the initial event but has presented persistent discomfort in her throat.  No other red flag signs.   Chest x-ray was unremarkable.  I had a thorough discussion with the patient regarding the fact that we we will try to look endoscopically for the foreign body (part of toothpick) but this may be difficult given the fact that s

## 2021-05-15 NOTE — Discharge Instructions (Signed)
You are being discharged to home.  ?Eat a soft diet for three days.  ?If severe abdominal pain, nausea or vomiting, need to call to the office STAT or go to the ER to be evaluated. ?Follow up in GI clinic in 1 week. ?

## 2021-05-15 NOTE — Anesthesia Preprocedure Evaluation (Signed)
Anesthesia Evaluation  ?Patient identified by MRN, date of birth, ID band ?Patient awake ? ? ? ?Reviewed: ?Allergy & Precautions, H&P , NPO status , Patient's Chart, lab work & pertinent test results, reviewed documented beta blocker date and time  ? ?Airway ?Mallampati: II ? ?TM Distance: >3 FB ?Neck ROM: full ? ? ? Dental ?no notable dental hx. ? ?  ?Pulmonary ?neg pulmonary ROS,  ?  ?Pulmonary exam normal ?breath sounds clear to auscultation ? ? ? ? ? ? Cardiovascular ?Exercise Tolerance: Good ?hypertension, negative cardio ROS ? ? ?Rhythm:regular Rate:Normal ? ? ?  ?Neuro/Psych ?negative neurological ROS ? negative psych ROS  ? GI/Hepatic ?negative GI ROS, Neg liver ROS,   ?Endo/Other  ?negative endocrine ROS ? Renal/GU ?negative Renal ROS  ?negative genitourinary ?  ?Musculoskeletal ? ? Abdominal ?  ?Peds ? Hematology ?negative hematology ROS ?(+)   ?Anesthesia Other Findings ? ? Reproductive/Obstetrics ?negative OB ROS ? ?  ? ? ? ? ? ? ? ? ? ? ? ? ? ?  ?  ? ? ? ? ? ? ? ? ?Anesthesia Physical ?Anesthesia Plan ? ?ASA: 2 and emergent ? ?Anesthesia Plan: General  ? ?Post-op Pain Management:   ? ?Induction:  ? ?PONV Risk Score and Plan: Propofol infusion ? ?Airway Management Planned:  ? ?Additional Equipment:  ? ?Intra-op Plan:  ? ?Post-operative Plan:  ? ?Informed Consent: I have reviewed the patients History and Physical, chart, labs and discussed the procedure including the risks, benefits and alternatives for the proposed anesthesia with the patient or authorized representative who has indicated his/her understanding and acceptance.  ? ? ? ?Dental Advisory Given ? ?Plan Discussed with: CRNA ? ?Anesthesia Plan Comments:   ? ? ? ? ? ? ?Anesthesia Quick Evaluation ? ?

## 2021-05-15 NOTE — Anesthesia Postprocedure Evaluation (Signed)
Anesthesia Post Note ? ?Patient: Linda Page ? ?Procedure(s) Performed: ESOPHAGOGASTRODUODENOSCOPY (EGD) WITH PROPOFOL ? ?Patient location during evaluation: PACU ?Anesthesia Type: General ?Level of consciousness: awake and alert ?Pain management: pain level controlled ?Vital Signs Assessment: post-procedure vital signs reviewed and stable ?Respiratory status: spontaneous breathing, nonlabored ventilation, respiratory function stable and patient connected to nasal cannula oxygen ?Cardiovascular status: blood pressure returned to baseline and stable ?Postop Assessment: no apparent nausea or vomiting ?Anesthetic complications: no ? ? ?No notable events documented. ? ? ?Last Vitals:  ?Vitals:  ? 05/15/21 1807 05/15/21 1838  ?BP:  (!) 165/88  ?Pulse:    ?Resp:  17  ?Temp: 36.9 ?C 37.1 ?C  ?SpO2:  95%  ?  ?Last Pain:  ?Vitals:  ? 05/15/21 1838  ?TempSrc: Oral  ?PainSc: 4   ? ? ?  ?  ?  ?  ?  ?  ? ?Windell [place] ? ? ? ? ?

## 2021-05-15 NOTE — ED Triage Notes (Signed)
Patient eating fillet mignon today and noticed a toothpick left in steak as she was swallowing. Patient feels like something is stuck in throat. Patient able to swallow and handle oral secretions. Denies any difficulty breathing.  ?

## 2021-05-15 NOTE — Transfer of Care (Signed)
Immediate Anesthesia Transfer of Care Note ? ?Patient: Linda Page ? ?Procedure(s) Performed: ESOPHAGOGASTRODUODENOSCOPY (EGD) WITH PROPOFOL ? ?Patient Location: PACU ? ?Anesthesia Type:General ? ?Level of Consciousness: awake and alert  ? ?Airway & Oxygen Therapy: Patient Spontanous Breathing ? ?Post-op Assessment: Report given to RN and Post -op Vital signs reviewed and stable ? ?Post vital signs: Reviewed and stable ? ?Last Vitals:  ?Vitals Value Taken Time  ?BP    ?Temp    ?Pulse    ?Resp    ?SpO2    ? ? ?Last Pain:  ?Vitals:  ? 05/15/21 1838  ?TempSrc: Oral  ?PainSc: 4   ?   ? ?Patients Stated Pain Goal: 7 (05/15/21 1838) ? ?Complications: No notable events documented. ?

## 2021-05-15 NOTE — ED Notes (Signed)
Pt taken to Endo. All belongings with pt and daughter.  ?

## 2021-05-16 ENCOUNTER — Emergency Department (HOSPITAL_COMMUNITY): Payer: Medicare PPO | Admitting: Anesthesiology

## 2021-05-16 ENCOUNTER — Other Ambulatory Visit: Payer: Self-pay

## 2021-05-16 ENCOUNTER — Encounter (HOSPITAL_COMMUNITY)
Admission: EM | Disposition: A | Discharge status: Home / Self Care | Payer: Self-pay | Source: Home / Self Care | Attending: Emergency Medicine

## 2021-05-16 ENCOUNTER — Emergency Department (HOSPITAL_COMMUNITY): Payer: Medicare PPO

## 2021-05-16 ENCOUNTER — Encounter (HOSPITAL_COMMUNITY): Payer: Self-pay

## 2021-05-16 ENCOUNTER — Ambulatory Visit (EMERGENCY_DEPARTMENT_HOSPITAL)
Admission: EM | Admit: 2021-05-16 | Discharge: 2021-05-16 | Disposition: A | Discharge status: Home / Self Care | Payer: Medicare PPO | Source: Home / Self Care | Attending: Emergency Medicine | Admitting: Emergency Medicine

## 2021-05-16 DIAGNOSIS — N281 Cyst of kidney, acquired: Secondary | ICD-10-CM | POA: Diagnosis not present

## 2021-05-16 DIAGNOSIS — R933 Abnormal findings on diagnostic imaging of other parts of digestive tract: Secondary | ICD-10-CM

## 2021-05-16 DIAGNOSIS — F32A Depression, unspecified: Secondary | ICD-10-CM | POA: Diagnosis not present

## 2021-05-16 DIAGNOSIS — T182XXA Foreign body in stomach, initial encounter: Secondary | ICD-10-CM | POA: Insufficient documentation

## 2021-05-16 DIAGNOSIS — D259 Leiomyoma of uterus, unspecified: Secondary | ICD-10-CM | POA: Diagnosis not present

## 2021-05-16 DIAGNOSIS — K3189 Other diseases of stomach and duodenum: Secondary | ICD-10-CM

## 2021-05-16 DIAGNOSIS — R1032 Left lower quadrant pain: Secondary | ICD-10-CM | POA: Diagnosis not present

## 2021-05-16 DIAGNOSIS — I10 Essential (primary) hypertension: Secondary | ICD-10-CM | POA: Insufficient documentation

## 2021-05-16 DIAGNOSIS — K259 Gastric ulcer, unspecified as acute or chronic, without hemorrhage or perforation: Secondary | ICD-10-CM | POA: Diagnosis not present

## 2021-05-16 DIAGNOSIS — I700 Atherosclerosis of aorta: Secondary | ICD-10-CM | POA: Diagnosis not present

## 2021-05-16 DIAGNOSIS — R1010 Upper abdominal pain, unspecified: Secondary | ICD-10-CM

## 2021-05-16 DIAGNOSIS — R1012 Left upper quadrant pain: Secondary | ICD-10-CM | POA: Insufficient documentation

## 2021-05-16 DIAGNOSIS — Z981 Arthrodesis status: Secondary | ICD-10-CM | POA: Diagnosis not present

## 2021-05-16 DIAGNOSIS — K5730 Diverticulosis of large intestine without perforation or abscess without bleeding: Secondary | ICD-10-CM | POA: Diagnosis not present

## 2021-05-16 DIAGNOSIS — Z79899 Other long term (current) drug therapy: Secondary | ICD-10-CM | POA: Insufficient documentation

## 2021-05-16 DIAGNOSIS — D649 Anemia, unspecified: Secondary | ICD-10-CM | POA: Diagnosis not present

## 2021-05-16 DIAGNOSIS — K429 Umbilical hernia without obstruction or gangrene: Secondary | ICD-10-CM | POA: Diagnosis not present

## 2021-05-16 DIAGNOSIS — T189XXA Foreign body of alimentary tract, part unspecified, initial encounter: Secondary | ICD-10-CM | POA: Diagnosis not present

## 2021-05-16 HISTORY — PX: ESOPHAGOGASTRODUODENOSCOPY (EGD) WITH PROPOFOL: SHX5813

## 2021-05-16 HISTORY — PX: BIOPSY: SHX5522

## 2021-05-16 LAB — COMPREHENSIVE METABOLIC PANEL
ALT: 28 U/L (ref 0–44)
AST: 24 U/L (ref 15–41)
Albumin: 4 g/dL (ref 3.5–5.0)
Alkaline Phosphatase: 65 U/L (ref 38–126)
Anion gap: 6 (ref 5–15)
BUN: 18 mg/dL (ref 8–23)
CO2: 27 mmol/L (ref 22–32)
Calcium: 9.2 mg/dL (ref 8.9–10.3)
Chloride: 107 mmol/L (ref 98–111)
Creatinine, Ser: 0.88 mg/dL (ref 0.44–1.00)
GFR, Estimated: >60 mL/min (ref >60)
Glucose, Bld: 110 mg/dL — ABNORMAL HIGH (ref 70–99)
Potassium: 3.9 mmol/L (ref 3.5–5.1)
Sodium: 140 mmol/L (ref 135–145)
Total Bilirubin: 0.4 mg/dL (ref 0.3–1.2)
Total Protein: 7.3 g/dL (ref 6.5–8.1)

## 2021-05-16 LAB — CBC WITH DIFFERENTIAL/PLATELET
Abs Immature Granulocytes: 0.01 10*3/uL (ref 0.00–0.07)
Basophils Absolute: 0.1 10*3/uL (ref 0.0–0.1)
Basophils Relative: 1 %
Eosinophils Absolute: 0.2 10*3/uL (ref 0.0–0.5)
Eosinophils Relative: 3 %
HCT: 35.9 % — ABNORMAL LOW (ref 36.0–46.0)
Hemoglobin: 11.5 g/dL — ABNORMAL LOW (ref 12.0–15.0)
Immature Granulocytes: 0 %
Lymphocytes Relative: 29 %
Lymphs Abs: 1.9 10*3/uL (ref 0.7–4.0)
MCH: 30.7 pg (ref 26.0–34.0)
MCHC: 32 g/dL (ref 30.0–36.0)
MCV: 95.7 fL (ref 80.0–100.0)
Monocytes Absolute: 0.5 10*3/uL (ref 0.1–1.0)
Monocytes Relative: 8 %
Neutro Abs: 4 10*3/uL (ref 1.7–7.7)
Neutrophils Relative %: 59 %
Platelets: 252 10*3/uL (ref 150–400)
RBC: 3.75 MIL/uL — ABNORMAL LOW (ref 3.87–5.11)
RDW: 13.2 % (ref 11.5–15.5)
WBC: 6.8 10*3/uL (ref 4.0–10.5)
nRBC: 0 % (ref 0.0–0.2)

## 2021-05-16 LAB — LIPASE, BLOOD: Lipase: 22 U/L (ref 11–51)

## 2021-05-16 SURGERY — ESOPHAGOGASTRODUODENOSCOPY (EGD) WITH PROPOFOL
Anesthesia: General

## 2021-05-16 MED ORDER — ONDANSETRON HCL 4 MG/2ML IJ SOLN
4.0000 mg | Freq: Once | INTRAMUSCULAR | Status: AC
  Administered 2021-05-16: 4 mg via INTRAVENOUS
  Filled 2021-05-16: qty 2

## 2021-05-16 MED ORDER — PANTOPRAZOLE SODIUM 40 MG PO TBEC: 40.0000 mg | DELAYED_RELEASE_TABLET | Freq: Two times a day (BID) | ORAL | 11 refills | Status: DC

## 2021-05-16 MED ORDER — IOHEXOL 300 MG/ML  SOLN
100.0000 mL | Freq: Once | INTRAMUSCULAR | Status: AC | PRN
Start: 2021-05-16 — End: 2021-05-16
  Administered 2021-05-16: 100 mL via INTRAVENOUS

## 2021-05-16 MED ORDER — SODIUM CHLORIDE 0.9 % IV SOLN: INTRAVENOUS | Status: DC

## 2021-05-16 MED ORDER — LACTATED RINGERS IV SOLN: INTRAVENOUS | Status: DC

## 2021-05-16 MED ORDER — FENTANYL CITRATE PF 50 MCG/ML IJ SOSY
50.0000 ug | PREFILLED_SYRINGE | Freq: Once | INTRAMUSCULAR | Status: AC
  Administered 2021-05-16: 50 ug via INTRAVENOUS
  Filled 2021-05-16: qty 1

## 2021-05-16 MED ORDER — LACTATED RINGERS IV SOLN: INTRAVENOUS | Status: DC | PRN

## 2021-05-16 MED ORDER — SUCRALFATE 1 G PO TABS: 1.0000 g | ORAL_TABLET | Freq: Four times a day (QID) | ORAL | 0 refills | Status: DC

## 2021-05-16 MED ORDER — MORPHINE SULFATE (PF) 4 MG/ML IV SOLN
4.0000 mg | Freq: Once | INTRAVENOUS | Status: AC
  Administered 2021-05-16: 4 mg via INTRAVENOUS
  Filled 2021-05-16: qty 1

## 2021-05-16 MED ORDER — PROPOFOL 10 MG/ML IV BOLUS
INTRAVENOUS | Status: DC | PRN
  Administered 2021-05-16: 300 mg via INTRAVENOUS

## 2021-05-16 NOTE — Transfer of Care (Signed)
Immediate Anesthesia Transfer of Care Note ? ?Patient: Linda Page ? ?Procedure(s) Performed: ESOPHAGOGASTRODUODENOSCOPY (EGD) WITH PROPOFOL ?BIOPSY ? ?Patient Location: PACU ? ?Anesthesia Type:General ? ?Level of Consciousness: awake and alert  ? ?Airway & Oxygen Therapy: Patient Spontanous Breathing and Patient connected to nasal cannula oxygen ? ?Post-op Assessment: Report given to RN and Post -op Vital signs reviewed and stable ? ?Post vital signs: Reviewed and stable ? ?Last Vitals:  ?Vitals Value Taken Time  ?BP    ?Temp    ?Pulse 83 05/16/21 1728  ?Resp    ?SpO2 94 % 05/16/21 1728  ?Vitals shown include unvalidated device data. ? ?Last Pain:  ?Vitals:  ? 05/16/21 1558  ?TempSrc: Oral  ?PainSc: 3   ?   ? ?Patients Stated Pain Goal: 7 (05/16/21 1558) ? ?Complications: No notable events documented. ?

## 2021-05-16 NOTE — ED Provider Notes (Signed)
?[facility] ?Provider Note ? ? ?CSN: [id number] ?Arrival date & time: 05/16/21  0353 ? ?  ? ?History ? ?Chief Complaint  ?Patient presents with  ? Abdominal Pain  ? ? ?Linda Page is a 71 y.o. female. ? ?The history is provided by the patient.  ?Abdominal Pain ?She has history of hypertension and was seen in the emergency department yesterday with concern of a swallowed toothpick.  She had an upper endoscopy and then was discharged and was feeling better.  She was awakened by her dog at about 2:30 AM, and noted pain across her upper abdomen with some radiation to the back.  There has been no associated nausea or vomiting and she denies fever, chills, sweats.  She was told to return to the emergency department if she had any pain. ?  ?Home Medications ?Prior to Admission medications   ?Medication Sig Start Date End Date Taking? Authorizing Provider  ?acetaminophen (TYLENOL) 325 MG tablet Take 650 mg by mouth every 6 (six) hours as needed.    [provider]  ?ALPRAZolam Prudy Feeler) 1 MG tablet Take 1 mg by mouth at bedtime.    [provider]  ?atorvastatin (LIPITOR) 20 MG tablet Take 20 mg by mouth at bedtime.     [provider]  ?lisinopril (ZESTRIL) 10 MG tablet Take 15 mg by mouth daily. 05/10/21   [provider]  ?metroNIDAZOLE (FLAGYL) 500 MG tablet Take 1 tablet (500 mg total) by mouth 2 (two) times daily. ?Patient not taking: Reported on 05/15/2021 04/06/21   Adline Potter, NP  ?sertraline (ZOLOFT) 100 MG tablet Take 150 mg by mouth at bedtime.     [provider]  ?   ? ?Allergies    ?Codeine and Other   ? ?Review of Systems   ?Review of Systems  ?Gastrointestinal:  Positive for abdominal pain.  ?All other systems reviewed and are negative. ? ?Physical Exam ?Updated Vital Signs ?BP (!) 148/103   Pulse 71   Temp 97.6 ?F (36.4 ?C)   Resp 19   SpO2 100%  ?Physical Exam ?Vitals and nursing note reviewed.  ?71 year old female, resting  comfortably and in no acute distress. Vital signs are significant for elevated blood pressure. Oxygen saturation is 100%, which is normal. ?Head is normocephalic and atraumatic. PERRLA, EOMI. Oropharynx is clear. ?Neck is nontender and supple without adenopathy or JVD. ?Back is nontender and there is no CVA tenderness. ?Lungs are clear without rales, wheezes, or rhonchi. ?Chest is nontender. ?Heart has regular rate and rhythm without murmur. ?Abdomen is soft, flat, with tenderness across the upper abdomen with maximum tenderness in the left upper quadrant.  Tenderness is mild to moderate.  There is no rebound or guarding.  Peristalsis is hypoactive. ?Extremities have no cyanosis or edema, full range of motion is present. ?Skin is warm and dry without rash. ?Neurologic: Mental status is normal, cranial nerves are intact, moves all extremities equally. ? ?ED Results / Procedures / Treatments   ?Labs ?(all labs ordered are listed, but only abnormal results are displayed) ?Labs Reviewed  ?COMPREHENSIVE METABOLIC PANEL - Abnormal; Notable for the following components:  ?    Result Value  ? Glucose, Bld 110 (*)   ? All other components within normal limits  ?CBC WITH DIFFERENTIAL/PLATELET - Abnormal; Notable for the following components:  ? RBC 3.75 (*)   ? Hemoglobin 11.5 (*)   ? HCT 35.9 (*)   ? All other components within normal  limits  ?LIPASE, BLOOD  ? ?Radiology ? ?Procedures ?Procedures  ? ? ?Medications Ordered in ED ?Medications - No data to display ? ?ED Course/ Medical Decision Making/ A&P ?  ?                        ?Medical Decision Making ?Amount and/or Complexity of Data Reviewed ?Labs: ordered. ?Radiology: ordered. ? ?Risk ?Prescription drug management. ? ? ?Abdominal pain following possible swallowed toothpick.  Old records are reviewed showing ED visit yesterday with concern of swallowed toothpick and possible esophageal injury.  She had upper endoscopy showing no esophageal injury, large amount of food  in the stomach which may have been hiding a toothpick, but no toothpick was seen.  New onset of pain is concerning for possible perforation from toothpick.  We will check screening labs and send for CT of abdomen and pelvis.  Patient was offered IV analgesics, and has declined. ? ?Patient later requested IV analgesic and she was given a dose of morphine.  WBC has come back normal.  Hemoglobin is slightly low but unchanged from yesterday.  Metabolic panel is unremarkable.  CT scan is pending.  Case is signed out to Dr. Effie Shy. ? ?Final Clinical Impression(s) / ED Diagnoses ?Final diagnoses:  ?Upper abdominal pain  ?Normochromic normocytic anemia  ? ? ?Rx / DC Orders ?ED Discharge Orders   ? ? None  ? ?  ? ? ?  ?Dione Booze, MD ?05/16/21 0710 ? ?

## 2021-05-16 NOTE — Discharge Instructions (Signed)
EGD ?Discharge instructions ?Please read the instructions outlined below and refer to this sheet in the next few weeks. These discharge instructions provide you with general information on caring for yourself after you leave the hospital. Your doctor may also give you specific instructions. While your treatment has been planned according to the most current medical practices available, unavoidable complications occasionally occur. If you have any problems or questions after discharge, please call your doctor. ?ACTIVITY ?You may resume your regular activity but move at a slower pace for the next 24 hours.  ?Take frequent rest periods for the next 24 hours.  ?Walking will help expel (get rid of) the air and reduce the bloated feeling in your abdomen.  ?No driving for 24 hours (because of the anesthesia (medicine) used during the test).  ?You may shower.  ?Do not sign any important legal documents or operate any machinery for 24 hours (because of the anesthesia used during the test).  ?NUTRITION ?Drink plenty of fluids.  ?You may resume your normal diet.  ?Begin with a light meal and progress to your normal diet.  ?Avoid alcoholic beverages for 24 hours or as instructed by your caregiver.  ?MEDICATIONS ?You may resume your normal medications unless your caregiver tells you otherwise.  ?WHAT YOU CAN EXPECT TODAY ?You may experience abdominal discomfort such as a feeling of fullness or ?gas? pains.  ?FOLLOW-UP ?Your doctor will discuss the results of your test with you.  ?SEEK IMMEDIATE MEDICAL ATTENTION IF ANY OF THE FOLLOWING OCCUR: ?Excessive nausea (feeling sick to your stomach) and/or vomiting.  ?Severe abdominal pain and distention (swelling).  ?Trouble swallowing.  ?Temperature over 101? F (37.8? C).  ?Rectal bleeding or vomiting of blood.  ? ?Your EGD revealed mild amount inflammation in your stomach.  I took biopsies of this to rule out infection with a bacteria called H. pylori.  Await pathology results, my  office will contact you.  There was an erosion/early ulcer in the end portion of your stomach.  I did not see any evidence of toothpick or debris from any foreign body.  I am going to start you on pantoprazole twice daily for the next 8 to 10 weeks at which point you can decrease down to once daily. I will also send in Carafate to take as needed to help soothe your stomach.  You can take this medication up to 4 times a day ? ? ? ?I hope you have a great rest of your week! ? ?Hennie Duos. Marletta Lor, D.O. ?Gastroenterology and Hepatology ?[facility] ? ?

## 2021-05-16 NOTE — Op Note (Signed)
[facility] ?Patient Name: Linda Page ?Procedure Date: 05/16/2021 5:01 PM ?MRN: [id number] ?Date of Birth: [birth year] ?Attending MD: Elon Alas. Abbey Chatters , DO ?CSN: [id number] ?Age: 71 ?Admit Type: Outpatient ?Procedure:                Upper GI endoscopy ?Indications:              Abnormal CT of the GI tract ?Providers:                Elon Alas. Abbey Chatters, DO, Lurline Del, RN, Ulice Dash  ?                          Blima Singer, Technician ?Referring MD:              ?Medicines:                See the Anesthesia note for documentation of the  ?                          administered medications ?Complications:            No immediate complications. ?Estimated Blood Loss:     Estimated blood loss was minimal. ?Procedure:                Pre-Anesthesia Assessment: ?                          - The anesthesia plan was to use monitored  ?                          anesthesia care (MAC). ?                          After obtaining informed consent, the endoscope was  ?                          passed under direct vision. Throughout the  ?                          procedure, the patient's blood pressure, pulse, and  ?                          oxygen saturations were monitored continuously. The  ?                          GIF-H190 ([id number]) scope was introduced through the  ?                          mouth, and advanced to the second part of duodenum.  ?                          The upper GI endoscopy was accomplished without  ?                          difficulty. The patient tolerated the procedure  ?                          well. ?  Scope In: 5:17:58 PM ?Scope Out: 5:24:08 PM ?Total Procedure Duration: 0 hours 6 minutes 10 seconds  ?Findings: ?     The examined esophagus was normal. ?     A single 6 mm erosion/?early ulcer with no stigmata of recent bleeding  ?     was found in the gastric antrum. Biopsies were taken with a cold forceps  ?     for Helicobacter pylori testing. ?     The duodenal bulb, first portion of the  duodenum and second portion of  ?     the duodenum were normal. ?Impression:               - Normal esophagus. ?                          - Erosive gastropathy with no stigmata of recent  ?                          bleeding. Biopsied. ?                          - Normal duodenal bulb, first portion of the  ?                          duodenum and second portion of the duodenum. ?Moderate Sedation: ?     Per Anesthesia Care ?Recommendation:           - Patient has a contact number available for  ?                          emergencies. The signs and symptoms of potential  ?                          delayed complications were discussed with the  ?                          patient. Return to normal activities tomorrow.  ?                          Written discharge instructions were provided to the  ?                          patient. ?                          - Resume previous diet. ?                          - Use Protonix (pantoprazole) 40 mg PO BID. ?                          - Use sucralfate tablets 1 gram PO QID for 2 weeks. ?Procedure Code(s):        --- Professional --- ?                          [phone], Esophagogastroduodenoscopy, flexible,  ?  transoral; with biopsy, single or multiple ?Diagnosis Code(s):        --- Professional --- ?                          K31.89, Other diseases of stomach and duodenum ?                          R93.3, Abnormal findings on diagnostic imaging of  ?                          other parts of digestive tract ?CPT copyright 2019 American Medical Association. All rights reserved. ?The codes documented in this report are preliminary and upon coder review may  ?be revised to meet current compliance requirements. ?Elon Alas. Abbey Chatters, DO ?Elon Alas. Abbey Chatters, DO ?05/16/2021 5:28:16 PM ?This report has been signed electronically. ?Number of Addenda: 0 ?

## 2021-05-16 NOTE — ED Notes (Signed)
Pt to CT

## 2021-05-16 NOTE — ED Provider Notes (Signed)
7:15 AM-checkout from Dr. Preston Fleeting to evaluate patient for possible complications from accidentally ingested toothpick.  Seen and treated overnight, in the ED, and had endoscopy.  No foreign body extraction was done at that time.  Patient returned for evaluation because of abdominal pain. ? ?9:20 AM-patient states her abdominal pain is controlled by narcotic pain reliever she has received.  She states that earlier her abdominal pain was generalized across the left to the right upper abdomen.  She did not eat and has not stooled since her endoscopy. ? ?2:00 PM-she has been seen by GI and they plan on doing repeat EGD today to evaluate the CT abnormality and possibly remove a foreign body.  Patient has remained stable in the ED. ?  ?Mancel Bale, MD ?05/16/21 1529 ? ?

## 2021-05-16 NOTE — ED Triage Notes (Signed)
Return to the ED POV from home with cc of abdominal pain after having a endoscopy on 5/7. ?Woke up this morning with abdominal pain. ?5/10. ?No n/v/d ?

## 2021-05-16 NOTE — Anesthesia Preprocedure Evaluation (Signed)
Anesthesia Evaluation  ?Patient identified by MRN, date of birth, ID band ?Patient awake ? ? ? ?Reviewed: ?Allergy & Precautions, H&P , NPO status , Patient's Chart, lab work & pertinent test results, reviewed documented beta blocker date and time  ? ?Airway ?Mallampati: II ? ?TM Distance: >3 FB ?Neck ROM: full ? ? ? Dental ?no notable dental hx. ? ?  ?Pulmonary ?neg pulmonary ROS,  ?  ?Pulmonary exam normal ?breath sounds clear to auscultation ? ? ? ? ? ? Cardiovascular ?Exercise Tolerance: Good ?hypertension, negative cardio ROS ? ? ?Rhythm:regular Rate:Normal ? ? ?  ?Neuro/Psych ?negative neurological ROS ? negative psych ROS  ? GI/Hepatic ?negative GI ROS, Neg liver ROS,   ?Endo/Other  ?negative endocrine ROS ? Renal/GU ?negative Renal ROS  ?negative genitourinary ?  ?Musculoskeletal ? ? Abdominal ?  ?Peds ? Hematology ?negative hematology ROS ?(+)   ?Anesthesia Other Findings ? ? Reproductive/Obstetrics ?negative OB ROS ? ?  ? ? ? ? ? ? ? ? ? ? ? ? ? ?  ?  ? ? ? ? ? ? ? ? ?Anesthesia Physical ? ?Anesthesia Plan ? ?ASA: 3 and emergent ? ?Anesthesia Plan: General  ? ?Post-op Pain Management:   ? ?Induction:  ? ?PONV Risk Score and Plan: Propofol infusion ? ?Airway Management Planned:  ? ?Additional Equipment:  ? ?Intra-op Plan:  ? ?Post-operative Plan:  ? ?Informed Consent: I have reviewed the patients History and Physical, chart, labs and discussed the procedure including the risks, benefits and alternatives for the proposed anesthesia with the patient or authorized representative who has indicated his/her understanding and acceptance.  ? ? ? ?Dental Advisory Given ? ?Plan Discussed with: CRNA ? ?Anesthesia Plan Comments:   ? ? ? ? ? ? ?Anesthesia Quick Evaluation ? ?

## 2021-05-16 NOTE — Progress Notes (Signed)
? ?Gastroenterology Progress Note  ? ?Referring Provider: [facility] ?Primary Care Physician:  Benita Stabile, MD ?Primary Gastroenterologist:  Dr. Marletta Lor (previously Dr. Darrick Penna) ? ?Patient ID: Linda Page; [id number]; [birth year]  ? ? ?Subjective  ? ?Patient reports she had eaten bacon-wrapped filet mignon yesterday that had a toothpick holding it together; she swallowed this and did not realize she had swallowed the toothpick until it was in her throat. She ate lots of bread trying to coat it. Due to persistent discomfort, she came to the ED yesterday. Underwent EGD by Dr. Levon Hedger with normal esophagus and large amount of food residue in stomach. She was discharged home.  ? ?Started having abdominal pain around 230 am in upper abdomen and radiating to back. Nothing to eat this morning. Last oral intake lunch time yesterday when eating the steak. CT today revealed questionable mild wall thickening of distal gastric antrum vs artifact from underdistension, not present on study Feb 2023.  ? ?She reports improvement in pain after receiving Morphine. Still with intermittent abdominal pain upper abdomen but improved.  ? ? ? ? ?Objective  ? ?Vital signs in last 24 hours ?Temp:  [97.6 ?F (36.4 ?C)-98.8 ?F (37.1 ?C)] 97.6 ?F (36.4 ?C) (05/08 1572) ?Pulse Rate:  [60-101] 69 (05/08 1200) ?Resp:  [12-26] 17 (05/08 1200) ?BP: (101-165)/(65-103) 112/65 (05/08 1200) ?SpO2:  [92 %-100 %] 96 % (05/08 1200) ?Weight:  [99.8 kg] 99.8 kg (05/07 1534) ?  ? ?Physical Exam ?General:   Alert and oriented, pleasant ?Heart:  S1, S2 present, no murmurs noted.  ?Lungs: Clear to auscultation bilaterally, without wheezing, rales, or rhonchi.  ?Abdomen:  Bowel sounds present, soft, TTP upper abdomen, non-distended. No HSM or hernias noted. No rebound or guarding. No masses appreciated  ?Msk:  Symmetrical without gross deformities. Normal posture. ?Extremities:  Without  edema. ?Neurologic:  Alert and  oriented x4 ?Skin:  Warm and dry,  intact without significant lesions.  ?Cervical Nodes:  No significant cervical adenopathy. ?Psych:  Alert and cooperative. Normal mood and affect. ? ?Intake/Output from previous day: ?No intake/output data recorded. ?Intake/Output this shift: ?No intake/output data recorded. ? ?Lab Results ? ?Recent Labs  ?  05/15/21 ?1704 05/16/21 ?0500  ?WBC 9.0 6.8  ?HGB 11.5* 11.5*  ?HCT 36.7 35.9*  ?PLT 276 252  ? ?BMET ?Recent Labs  ?  05/15/21 ?1704 05/16/21 ?0500  ?NA 141 140  ?K 3.8 3.9  ?CL 105 107  ?CO2 30 27  ?GLUCOSE 107* 110*  ?BUN 21 18  ?CREATININE 0.87 0.88  ?CALCIUM 9.5 9.2  ? ?LFT ?Recent Labs  ?  05/16/21 ?0500  ?PROT 7.3  ?ALBUMIN 4.0  ?AST 24  ?ALT 28  ?ALKPHOS 65  ?BILITOT 0.4  ? ? ? ?Studies/Results ?CT abd/pelvis with contrast:  ?IMPRESSION: ?Mild sigmoid diverticulosis without evidence of diverticulitis. ?  ?Small umbilical hernia containing fat. ?  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 ?  ?Question mild wall thickening of distal gastric antrum versus ?artifact from underdistention, not present on prior study of ?03/02/2021; recommend correlation with symptoms and consider ?follow-up upper GI exam or endoscopy to evaluate. ?  ?No additional intra-abdominal or intrapelvic abnormalities. ?  ?Aortic Atherosclerosis (ICD10-I70.0). ?Assessment  ?71 y.o. female recently seen yesterday after swallowing a toothpick accidentally while eating bacon-wrapped filet mignon, undergoing EGD with normal esophagus and large amount of food residue in stomach, now returning with acute onset of abdominal pain around 0230 this morning radiating to back.  CT today revealed questionable mild wall thickening of distal gastric antrum vs artifact from underdistension, not present on study Feb 2023.  ? ?Pain has improved but still waxing/waning. She has not vomited. No food since yesterday at lunch.  ? ?As she does note swallowing a toothpick and now with CT findings, will pursue diagnostic EGD this afternoon.  Remain NPO for now. Patient was informed of risks and benefits with stated understanding.  ? ? ? ?Plan / Recommendations  ?Proceed with upper endoscopy by Dr. Marletta Lor in near future: the risks, benefits, and alternatives have been discussed with the patient in detail. The patient states understanding and desires to proceed.  ?Further recommendations to follow  ? ? ? LOS: 0 days  ? ? 05/16/2021, 12:40 PM ? ?Gelene Mink, PhD, ANP-BC ?[facility]  ? ? ? ?

## 2021-05-17 ENCOUNTER — Encounter (HOSPITAL_COMMUNITY): Payer: Self-pay | Admitting: Internal Medicine

## 2021-05-17 NOTE — Anesthesia Postprocedure Evaluation (Signed)
Anesthesia Post Note ? ?Patient: Linda Page ? ?Procedure(s) Performed: ESOPHAGOGASTRODUODENOSCOPY (EGD) WITH PROPOFOL ?BIOPSY ? ?Patient location during evaluation: Phase II ?Anesthesia Type: General ?Level of consciousness: awake ?Pain management: pain level controlled ?Vital Signs Assessment: post-procedure vital signs reviewed and stable ?Respiratory status: spontaneous breathing and respiratory function stable ?Cardiovascular status: blood pressure returned to baseline and stable ?Postop Assessment: no headache and no apparent nausea or vomiting ?Anesthetic complications: no ?Comments: Late entry ? ? ?No notable events documented. ? ? ?Last Vitals:  ?Vitals:  ? 05/16/21 1730 05/16/21 1738  ?BP: 120/73 (!) 142/85  ?Pulse: 80 80  ?Resp: 16 16  ?Temp: 36.4 ?C 36.7 ?C  ?SpO2: 96% 96%  ?  ?Last Pain:  ?Vitals:  ? 05/16/21 1738  ?TempSrc: Oral  ?PainSc: 2   ? ? ?  ?  ?  ?  ?  ?  ? ?Windell [place] ? ? ? ? ?

## 2021-05-18 LAB — SURGICAL PATHOLOGY

## 2021-05-26 ENCOUNTER — Ambulatory Visit: Payer: Medicare PPO | Admitting: Internal Medicine

## 2021-05-26 ENCOUNTER — Encounter: Payer: Self-pay | Admitting: Internal Medicine

## 2021-05-26 VITALS — BP 114/68 | HR 93 | Temp 97.3°F | Ht 64.0 in | Wt 222.6 lb

## 2021-05-26 DIAGNOSIS — T182XXD Foreign body in stomach, subsequent encounter: Secondary | ICD-10-CM

## 2021-05-26 DIAGNOSIS — Z1211 Encounter for screening for malignant neoplasm of colon: Secondary | ICD-10-CM | POA: Diagnosis not present

## 2021-05-26 DIAGNOSIS — K259 Gastric ulcer, unspecified as acute or chronic, without hemorrhage or perforation: Secondary | ICD-10-CM | POA: Diagnosis not present

## 2021-05-26 NOTE — Patient Instructions (Signed)
I am happy to hear that you are doing well.  You can decrease your pantoprazole to once daily.  I would take this for another 6 to 8 weeks to heal up your gastric erosion and then you can stop it altogether.  We will plan on colonoscopy 2030 for colon cancer screening.  Otherwise follow-up with GI as needed.  It was very nice seeing you again today.  Dr. Marletta Lor  At [facility] we value your feedback. You may receive a survey about your visit today. Please share your experience as we strive to create trusting relationships with our patients to provide genuine, compassionate, quality care.  We appreciate your understanding and patience as we review any laboratory studies, imaging, and other diagnostic tests that are ordered as we care for you. Our office policy is 5 business days for review of these results, and any emergent or urgent results are addressed in a timely manner for your best interest. If you do not hear from our office in 1 week, please contact us.   We also encourage the use of MyChart, which contains your medical information for your review as well. If you are not enrolled in this feature, an access code is on this after visit summary for your convenience. Thank you for allowing [place] to be involved in your care.  It was great to see you today!  I hope you have a great rest of your Spring!    Hennie Duos. Marletta Lor, D.O. Gastroenterology and Hepatology [facility]

## 2021-05-26 NOTE — Progress Notes (Signed)
Referring Provider: Benita Stabile, MD Primary Care Physician:  Benita Stabile, MD Primary GI:  Dr. Marletta Lor  Chief Complaint  Patient presents with   Follow-up    Follow up toothpick removal    HPI:   Linda Page is a 71 y.o. female who presents to clinic today for follow-up visit.  Seen in [place], ER 05/15/2021 for suspected foreign body of the stomach, she had eaten bacon-wrapped filet mignon yesterday that had a toothpick holding it together; she swallowed this and did not realize she had swallowed the toothpick until it was in her throat. She ate lots of bread trying to coat it. Due to persistent discomfort, she came to the ED.  Underwent EGD by Dr. Levon Hedger with normal esophagus and large amount of food residue in stomach. She was discharged home.    Started having abdominal pain later that evening in upper abdomen and radiating to back. CT revealed questionable mild wall thickening of distal gastric antrum vs artifact from underdistension, not present on study Feb 2023.   Underwent repeat EGD 05/16/2021 which showed no evidence of foreign body.  Did have a gastric erosion.  Biopsies negative for H. pylori.  Discharged home on PPI twice daily and Carafate.  She states she passed a toothpick a few days later after taking laxatives.  Today states she is doing great.  No abdominal pain.  No heartburn or acid reflux.  No dysphagia odynophagia.  Colonoscopy 2020 unremarkable.  Recall 2030 for colon cancer screening  Past Medical History:  Diagnosis Date   Anxiety    Arthritis    in neck, in knee has gel shots givin in knee once a week for 3 weeks   BV (bacterial vaginosis) 04/06/2021   /329/23 rx flagyl   Cervical high risk HPV (human papillomavirus) test positive 04/06/2021   Repeat pap in 1 year per ASCCP guidelines 5 year risk for CIN3+ 4.8%   Depression    Hypertension     Past Surgical History:  Procedure Laterality Date   ANTERIOR CERVICAL DECOMP/DISCECTOMY FUSION  N/A 04/29/2015   Procedure: ACDF C4-7;  Surgeon: Venita Lick, MD;  Location: [facility];  Service: Orthopedics;  Laterality: N/A;   BIOPSY  05/16/2021   Procedure: BIOPSY;  Surgeon: Lanelle Bal, DO;  Location: [facility];  Service: Endoscopy;;  gastric erosion   cataract surgery     bilateral   COLONOSCOPY WITH PROPOFOL N/A 11/05/2018   Procedure: COLONOSCOPY WITH PROPOFOL;  Surgeon: West Bali, MD;  Location: [facility];  Service: Endoscopy;  Laterality: N/A;  10:30am   ESOPHAGOGASTRODUODENOSCOPY (EGD) WITH PROPOFOL N/A 05/16/2021   Procedure: ESOPHAGOGASTRODUODENOSCOPY (EGD) WITH PROPOFOL;  Surgeon: Lanelle Bal, DO;  Location: [facility];  Service: Endoscopy;  Laterality: N/A;   ESOPHAGOGASTRODUODENOSCOPY (EGD) WITH PROPOFOL N/A 05/15/2021   Procedure: ESOPHAGOGASTRODUODENOSCOPY (EGD) WITH PROPOFOL;  Surgeon: Dolores Frame, MD;  Location: [facility];  Service: Gastroenterology;  Laterality: N/A;   HAND SURGERY     rt   KNEE ARTHROPLASTY Right 04/03/2019   Procedure: COMPUTER ASSISTED TOTAL KNEE ARTHROPLASTY;  Surgeon: Samson Frederic, MD;  Location: [facility];  Service: Orthopedics;  Laterality: Right;   KNEE SURGERY     rt    Current Outpatient Medications  Medication Sig Dispense Refill   acetaminophen (TYLENOL) 325 MG tablet Take 650 mg by mouth every 6 (six) hours as needed.     ALPRAZolam (XANAX) 1 MG tablet Take 1 mg by  mouth at bedtime.     atorvastatin (LIPITOR) 20 MG tablet Take 20 mg by mouth at bedtime.      lisinopril (ZESTRIL) 10 MG tablet Take 15 mg by mouth daily.     pantoprazole (PROTONIX) 40 MG tablet Take 1 tablet (40 mg total) by mouth 2 (two) times daily. 60 tablet 11   Pseudoephedrine-Acetaminophen (SINUS PO) Take 1 tablet by mouth daily as needed (sinus/drainage).     sertraline (ZOLOFT) 100 MG tablet Take 150 mg by mouth at bedtime.      sucralfate (CARAFATE) 1 g tablet Take 1 tablet (1 g total) by mouth 4 (four) times daily for 14  days. 56 tablet 0   No current facility-administered medications for this visit.    Allergies as of 05/26/2021 - Review Complete 05/26/2021  Allergen Reaction Noted   Codeine Nausea And Vomiting 04/29/2015   Other Itching 04/15/2015    Family History  Problem Relation Age of Onset   Kidney disease Father    Brain cancer Mother    Colon polyps Brother        age 69   Colon cancer Neg Hx     Social History   Socioeconomic History   Marital status: Widowed    Spouse name: Not on file   Number of children: Not on file   Years of education: Not on file   Highest education level: Not on file  Occupational History   Not on file  Tobacco Use   Smoking status: Never   Smokeless tobacco: Never  Vaping Use   Vaping Use: Never used  Substance and Sexual Activity   Alcohol use: No   Drug use: No   Sexual activity: Not Currently    Birth control/protection: None, Post-menopausal  Other Topics Concern   Not on file  Social History Narrative   Not on file   Social Determinants of Health   Financial Resource Strain: Low Risk    Difficulty of Paying Living Expenses: Not hard at all  Food Insecurity: No Food Insecurity   Worried About Programme researcher, broadcasting/film/videounning Out of Food in the Last Year: Never true   Ran Out of Food in the Last Year: Never true  Transportation Needs: No Transportation Needs   Lack of Transportation (Medical): No   Lack of Transportation (Non-Medical): No  Physical Activity: Inactive   Days of Exercise per Week: 0 days   Minutes of Exercise per Session: 0 min  Stress: Stress Concern Present   Feeling of Stress : Very much  Social Connections: Moderately Isolated   Frequency of Communication with Friends and Family: More than three times a week   Frequency of Social Gatherings with Friends and Family: More than three times a week   Attends Religious Services: More than 4 times per year   Active Member of [employer] or Organizations: No   Attends BankerClub or Organization Meetings:  Never   Marital Status: Widowed    Subjective: Review of Systems  Constitutional:  Negative for chills and fever.  HENT:  Negative for congestion and hearing loss.   Eyes:  Negative for blurred vision and double vision.  Respiratory:  Negative for cough and shortness of breath.   Cardiovascular:  Negative for chest pain and palpitations.  Gastrointestinal:  Negative for abdominal pain, blood in stool, constipation, diarrhea, heartburn, melena and vomiting.  Genitourinary:  Negative for dysuria and urgency.  Musculoskeletal:  Negative for joint pain and myalgias.  Skin:  Negative for itching and rash.  Neurological:  Negative for dizziness and headaches.  Psychiatric/Behavioral:  Negative for depression. The patient is not nervous/anxious.     Objective: BP 114/68   Pulse 93   Temp (!) 97.3 F (36.3 C)   Ht 5\' 4"  (1.626 m)   Wt 222 lb 9.6 oz (101 kg)   BMI 38.21 kg/m  Physical Exam Constitutional:      Appearance: Normal appearance.  HENT:     Head: Normocephalic and atraumatic.  Eyes:     Extraocular Movements: Extraocular movements intact.     Conjunctiva/sclera: Conjunctivae normal.  Cardiovascular:     Rate and Rhythm: Normal rate and regular rhythm.  Pulmonary:     Effort: Pulmonary effort is normal.     Breath sounds: Normal breath sounds.  Abdominal:     General: Bowel sounds are normal.     Palpations: Abdomen is soft.  Musculoskeletal:        General: No swelling. Normal range of motion.     Cervical back: Normal range of motion and neck supple.  Skin:    General: Skin is warm and dry.     Coloration: Skin is not jaundiced.  Neurological:     General: No focal deficit present.     Mental Status: She is alert and oriented to person, place, and time.  Psychiatric:        Mood and Affect: Mood normal.        Behavior: Behavior normal.     Assessment: *Foreign body in the stomach *Gastric erosion *Colon cancer screening  Plan: Patient reports  passing the piece of toothpick in her stool.  No symptoms for me today.  Given her gastric erosion, would recommend pantoprazole daily for another 6 to 8 weeks at which point she can discontinue altogether.  H. pylori negative.  Colonoscopy due 2030 for colon cancer screening.  Otherwise follow-up as needed.  05/26/2021 12:19 PM   Disclaimer: This note was dictated with voice recognition software. Similar sounding words can inadvertently be transcribed and may not be corrected upon review.

## 2021-06-10 ENCOUNTER — Ambulatory Visit: Payer: Medicare PPO | Admitting: Podiatry

## 2021-06-10 ENCOUNTER — Encounter: Payer: Self-pay | Admitting: Podiatry

## 2021-06-10 DIAGNOSIS — M7672 Peroneal tendinitis, left leg: Secondary | ICD-10-CM

## 2021-06-10 DIAGNOSIS — T148XXA Other injury of unspecified body region, initial encounter: Secondary | ICD-10-CM

## 2021-06-12 NOTE — Progress Notes (Signed)
Subjective:   Patient ID: Linda Page, female   DOB: 71 y.o.   MRN: [id number]   HPI Patient presents stating the pain came back again several weeks ago and is just as bad and she has had this for over 3 years   ROS      Objective:  Physical Exam  There are status intact with patient found to have continued discomfort in the peroneal tendon as it inserts into the base of the fifth metatarsal left with inflammation fluid and pain with pressure     Assessment:  Probability for subtle tear of the peroneal tendon left with MRI that was inconclusive     Plan:  H&P reviewed condition and I am going to send for overread to try to understand better the pathology and what may be necessary but I do think ultimate surgical exploration will be necessary and I want to first get the over read and she does not want to do this until the fall and we will have that discussion once we get the over read and she makes it through some.  All education all questions answered today

## 2021-07-30 ENCOUNTER — Other Ambulatory Visit: Payer: Self-pay

## 2021-07-30 ENCOUNTER — Encounter (HOSPITAL_COMMUNITY): Payer: Self-pay

## 2021-07-30 ENCOUNTER — Emergency Department (HOSPITAL_COMMUNITY)
Admission: EM | Admit: 2021-07-30 | Discharge: 2021-07-30 | Disposition: A | Discharge status: Home / Self Care | Payer: Medicare PPO | Attending: Emergency Medicine | Admitting: Emergency Medicine

## 2021-07-30 DIAGNOSIS — W57XXXA Bitten or stung by nonvenomous insect and other nonvenomous arthropods, initial encounter: Secondary | ICD-10-CM | POA: Diagnosis not present

## 2021-07-30 DIAGNOSIS — S30860A Insect bite (nonvenomous) of lower back and pelvis, initial encounter: Secondary | ICD-10-CM | POA: Diagnosis not present

## 2021-07-30 MED ORDER — DOXYCYCLINE HYCLATE 100 MG PO CAPS: 100.0000 mg | ORAL_CAPSULE | Freq: Two times a day (BID) | ORAL | 0 refills | Status: DC

## 2021-07-30 MED ORDER — DOXYCYCLINE HYCLATE 100 MG PO TABS
100.0000 mg | ORAL_TABLET | Freq: Once | ORAL | Status: AC
  Administered 2021-07-30: 100 mg via ORAL
  Filled 2021-07-30: qty 1

## 2021-07-30 MED ORDER — DOXYCYCLINE HYCLATE 100 MG PO TABS: 100.0000 mg | ORAL_TABLET | Freq: Once | ORAL | Status: DC

## 2021-07-30 NOTE — Discharge Instructions (Signed)
Contact a health care provider if: You have symptoms of a disease after a tick bite. Symptoms of a tick-borne disease can occur from moments after the tick bites to 30 days after a tick is removed. Symptoms include: Fever or chills. Any of these signs in the bite area: A red rash that makes a circle (bull's-eye rash) in the bite area. Redness and swelling. Headache. Muscle, joint, or bone pain. Abnormal tiredness. Numbness in your legs or difficulty walking or moving your legs. Tender, swollen lymph glands. A part of a tick breaks off and gets stuck in your skin. Get help right away if: You are not able to remove a tick. You experience muscle weakness or paralysis. Your symptoms get worse or you experience new symptoms. You find an engorged tick on your skin and you are in an area where disease from ticks is a high risk. 

## 2021-07-30 NOTE — ED Provider Notes (Signed)
[facility] EMERGENCY DEPARTMENT Provider Note   CSN: [id number] Arrival date & time: 07/30/21  2145     History  Chief Complaint  Patient presents with   Tick Removal    Linda Page is a 71 y.o. female who presents for tick bite.  Patient was bitten on her left lower back 6 days ago by a tick.  Her daughter removed the tick.  She noticed a bull's-eye rash around the tick bite today.  She denies shaking chills, fever, malaise or fatigue.  HPI     Home Medications Prior to Admission medications   Medication Sig Start Date End Date Taking? Authorizing Provider  acetaminophen (TYLENOL) 325 MG tablet Take 650 mg by mouth every 6 (six) hours as needed.    [provider]  ALPRAZolam Prudy Feeler) 1 MG tablet Take 1 mg by mouth at bedtime.    [provider]  atorvastatin (LIPITOR) 20 MG tablet Take 20 mg by mouth at bedtime.     [provider]  lisinopril (ZESTRIL) 10 MG tablet Take 15 mg by mouth daily. 05/10/21   [provider]  pantoprazole (PROTONIX) 40 MG tablet Take 1 tablet (40 mg total) by mouth 2 (two) times daily. 05/16/21 05/16/22  Lanelle Bal, DO  Pseudoephedrine-Acetaminophen (SINUS PO) Take 1 tablet by mouth daily as needed (sinus/drainage).    [provider]  sertraline (ZOLOFT) 100 MG tablet Take 150 mg by mouth at bedtime.     [provider]  sucralfate (CARAFATE) 1 g tablet Take 1 tablet (1 g total) by mouth 4 (four) times daily for 14 days. 05/16/21 05/30/21  Lanelle Bal, DO      Allergies    Codeine and Other    Review of Systems   Review of Systems  Physical Exam Updated Vital Signs BP (!) 156/86   Pulse 82   Temp 98 F (36.7 C) (Oral)   Resp 15   Ht 5\' 4"  (1.626 m)   Wt 99.8 kg   SpO2 95%   BMI 37.76 kg/m  Physical Exam Vitals and nursing note reviewed.  Constitutional:      General: She is not in acute distress.    Appearance: She is well-developed. She is not diaphoretic.  HENT:      Head: Normocephalic and atraumatic.     Right Ear: External ear normal.     Left Ear: External ear normal.     Nose: Nose normal.     Mouth/Throat:     Mouth: Mucous membranes are moist.  Eyes:     General: No scleral icterus.    Conjunctiva/sclera: Conjunctivae normal.  Cardiovascular:     Rate and Rhythm: Normal rate and regular rhythm.     Heart sounds: Normal heart sounds. No murmur heard.    No friction rub. No gallop.  Pulmonary:     Effort: Pulmonary effort is normal. No respiratory distress.     Breath sounds: Normal breath sounds.  Abdominal:     General: Bowel sounds are normal. There is no distension.     Palpations: Abdomen is soft. There is no mass.     Tenderness: There is no abdominal tenderness. There is no guarding.  Musculoskeletal:     Cervical back: Normal range of motion.  Skin:    General: Skin is warm and dry.     Comments: Small area of erythema which appears to be a bite site on the left lower back.  There is a surrounding  target lesion which is erythematous, no tenderness to palpation  Neurological:     Mental Status: She is alert and oriented to person, place, and time.  Psychiatric:        Behavior: Behavior normal.      ED Results / Procedures / Treatments   Labs (all labs ordered are listed, but only abnormal results are displayed) Labs Reviewed - No data to display  EKG None  Radiology No results found.  Procedures Procedures    Medications Ordered in ED Medications - No data to display  ED Course/ Medical Decision Making/ A&P                           Medical Decision Making Patient here for evaluation of rash after tick bite.  She has a target lesion.  We will treat for tickborne illness.  No evidence of cellulitis.  Discussed outpatient follow-up and return precautions.           Final Clinical Impression(s) / ED Diagnoses Final diagnoses:  None    Rx / DC Orders ED Discharge Orders     None          Arthor Captain, PA-C 07/30/21 2236    Eber Hong, MD 08/04/21 [phone]

## 2021-07-30 NOTE — ED Triage Notes (Signed)
Pt arrived from home via POV w c/o tick bite x 6 days ago that now has a bullseye rash around it. Pt denies feeling bad or any fevers

## 2021-08-03 ENCOUNTER — Encounter: Payer: Self-pay | Admitting: Podiatry

## 2021-08-03 ENCOUNTER — Ambulatory Visit (INDEPENDENT_AMBULATORY_CARE_PROVIDER_SITE_OTHER): Payer: Medicare PPO | Admitting: Podiatry

## 2021-08-03 DIAGNOSIS — T148XXA Other injury of unspecified body region, initial encounter: Secondary | ICD-10-CM | POA: Diagnosis not present

## 2021-08-03 DIAGNOSIS — M722 Plantar fascial fibromatosis: Secondary | ICD-10-CM

## 2021-08-03 MED ORDER — TRIAMCINOLONE ACETONIDE 10 MG/ML IJ SUSP
10.0000 mg | Freq: Once | INTRAMUSCULAR | Status: AC
  Administered 2021-08-03: 10 mg

## 2021-08-03 NOTE — Progress Notes (Signed)
Subjective:   Patient ID: Linda Page, female   DOB: 71 y.o.   MRN: [id number]   HPI Patient states she still has a lot of pain in the outside of her left foot and states that she knows she is in need surgery but in a perfect world would like to wait till January and has developed pain in the bottom of her left heel over the last few weeks   ROS      Objective:  Physical Exam  Neurovascular status intact muscle strength adequate range of motion within normal limits with patient found to have exquisite discomfort in the mid band of the plantar fascia left at insertion and has chronic pain in the peroneal tendon left with pain mostly near insertion base of fifth metatarsal and extending proximal work.  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 Lilian Kapur for his opinion in November with the consideration for him to do surgery on her in January.  I do think at the least exploratory surgical be necessary given long-term history of condition

## 2021-08-12 ENCOUNTER — Telehealth: Payer: Self-pay | Admitting: *Deleted

## 2021-08-12 NOTE — Telephone Encounter (Signed)
Faxed request for MRI disc from [facility]-08/12/21, to be sent to [facility]

## 2021-08-31 IMAGING — DX DG KNEE 1-2V PORT*R*
2 series · 2 of 2 positions shown · non-contrast
Comparison: None.

CLINICAL DATA: Status post total knee replacement

EXAM:
PORTABLE RIGHT KNEE - 1-2 VIEW

[knee ap]
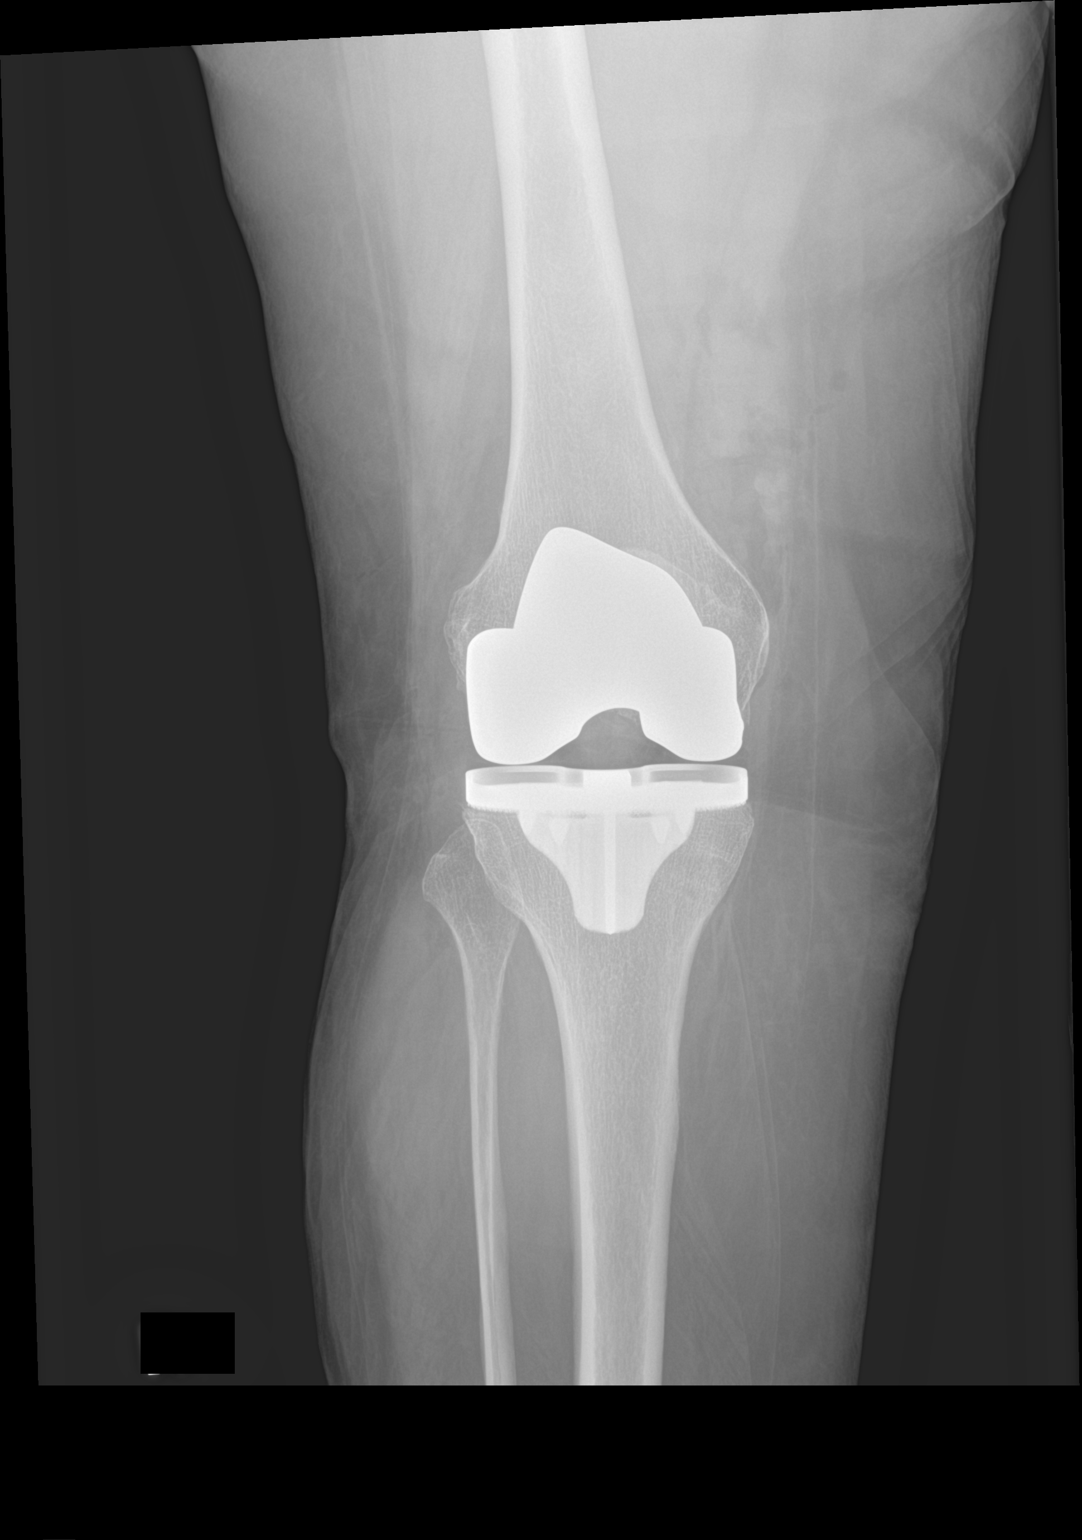

[knee lat]
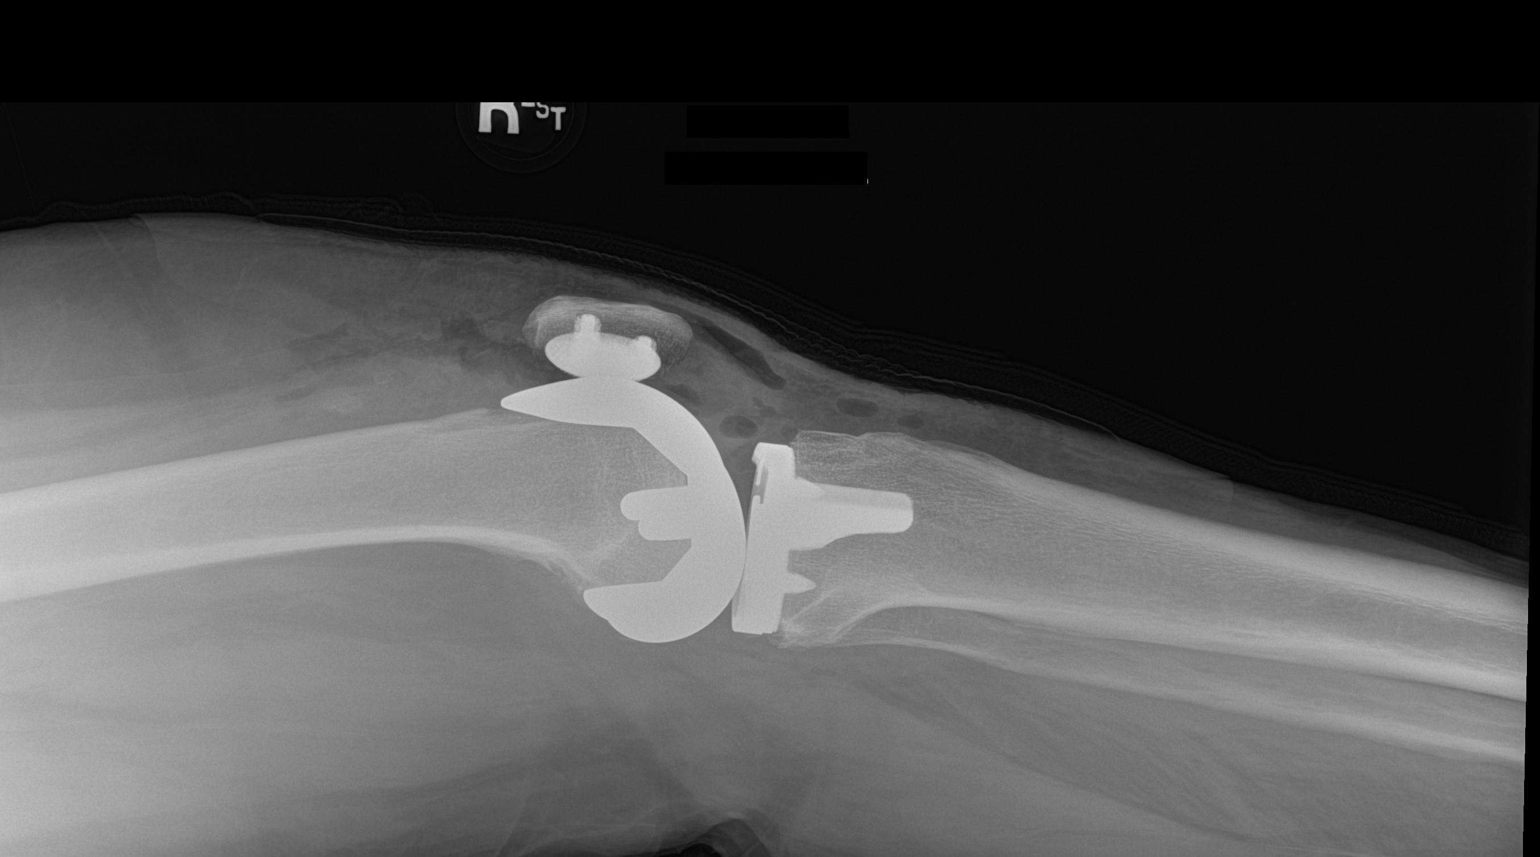

[2 of 2 positions shown; findings below may reference images not displayed]

FINDINGS: Frontal and lateral views obtained. Status post total knee
replacement with femoral, tibial, and posterior patellar prosthetic
components well-seated. No fracture or dislocation. Fluid and air
within the joint are expected postoperative findings.
IMPRESSION: Postoperative changes. Prosthetic components well-seated at all
sites. No acute fracture or dislocation evident.

## 2021-09-01 ENCOUNTER — Other Ambulatory Visit (HOSPITAL_COMMUNITY): Payer: Self-pay | Admitting: Adult Health

## 2021-09-01 DIAGNOSIS — N6310 Unspecified lump in the right breast, unspecified quadrant: Secondary | ICD-10-CM

## 2021-09-14 DIAGNOSIS — R7303 Prediabetes: Secondary | ICD-10-CM | POA: Diagnosis not present

## 2021-09-14 DIAGNOSIS — I10 Essential (primary) hypertension: Secondary | ICD-10-CM | POA: Diagnosis not present

## 2021-09-14 DIAGNOSIS — N189 Chronic kidney disease, unspecified: Secondary | ICD-10-CM | POA: Diagnosis not present

## 2021-09-14 DIAGNOSIS — E782 Mixed hyperlipidemia: Secondary | ICD-10-CM | POA: Diagnosis not present

## 2021-09-21 DIAGNOSIS — R7303 Prediabetes: Secondary | ICD-10-CM | POA: Diagnosis not present

## 2021-09-21 DIAGNOSIS — I10 Essential (primary) hypertension: Secondary | ICD-10-CM | POA: Diagnosis not present

## 2021-09-21 DIAGNOSIS — R809 Proteinuria, unspecified: Secondary | ICD-10-CM | POA: Diagnosis not present

## 2021-09-21 DIAGNOSIS — F419 Anxiety disorder, unspecified: Secondary | ICD-10-CM | POA: Diagnosis not present

## 2021-09-21 DIAGNOSIS — Z8249 Family history of ischemic heart disease and other diseases of the circulatory system: Secondary | ICD-10-CM | POA: Diagnosis not present

## 2021-09-21 DIAGNOSIS — L282 Other prurigo: Secondary | ICD-10-CM | POA: Diagnosis not present

## 2021-09-21 DIAGNOSIS — Z0000 Encounter for general adult medical examination without abnormal findings: Secondary | ICD-10-CM | POA: Diagnosis not present

## 2021-09-21 DIAGNOSIS — N189 Chronic kidney disease, unspecified: Secondary | ICD-10-CM | POA: Diagnosis not present

## 2021-09-21 DIAGNOSIS — E782 Mixed hyperlipidemia: Secondary | ICD-10-CM | POA: Diagnosis not present

## 2021-10-01 ENCOUNTER — Other Ambulatory Visit: Payer: Self-pay

## 2021-10-01 ENCOUNTER — Encounter: Payer: Self-pay | Admitting: Emergency Medicine

## 2021-10-01 ENCOUNTER — Ambulatory Visit
Admission: EM | Admit: 2021-10-01 | Discharge: 2021-10-01 | Disposition: A | Discharge status: Home / Self Care | Payer: Medicare PPO | Attending: Family Medicine | Admitting: Family Medicine

## 2021-10-01 DIAGNOSIS — U071 COVID-19: Secondary | ICD-10-CM

## 2021-10-01 MED ORDER — MOLNUPIRAVIR EUA 200MG CAPSULE: 4.0000 | ORAL_CAPSULE | Freq: Two times a day (BID) | ORAL | 0 refills | Status: AC

## 2021-10-01 MED ORDER — PROMETHAZINE-DM 6.25-15 MG/5ML PO SYRP: 5.0000 mL | ORAL_SOLUTION | Freq: Four times a day (QID) | ORAL | 0 refills | Status: DC | PRN

## 2021-10-01 NOTE — ED Triage Notes (Signed)
Pt reports tested positive for covid last night the patient reports chills, body aches. Last dose of tylenol/ibuprofen last night.

## 2021-10-05 NOTE — ED Provider Notes (Signed)
[facility]    CSN: [id number] Arrival date & time: 10/01/21  0804      History   Chief Complaint Chief Complaint  Patient presents with   Cough    HPI Linda Page is a 71 y.o. female.   Presenting today with 1 day history of chills, body aches, cough, congestion. Denies CP, SOB, abdominal pain, N/V/D. Tested positive for COVID 19 at home. Taking OTC pain relievers with mild temporary relief of sxs. No known hx of chronic pulmonary dz.     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   Upper abdominal pain    Gastric foreign body    BV (bacterial vaginosis) 04/06/2021   Cervical high risk HPV (human papillomavirus) test positive 04/06/2021   Encounter for gynecological examination with Papanicolaou smear of cervix 03/31/2021   Encounter for screening fecal occult blood testing 03/31/2021   Screening mammogram for breast cancer 03/31/2021   Osteoarthritis of right knee 04/03/2019   Preventative health care 08/12/2018   Neck pain 04/29/2015   Degeneration of cervical intervertebral disc 09/24/2013   Pain in joint, shoulder region 09/24/2013   Muscle weakness (generalized) 09/24/2013   Decreased range of motion of neck 09/24/2013    Past Surgical History:  Procedure Laterality Date   ANTERIOR CERVICAL DECOMP/DISCECTOMY FUSION N/A 04/29/2015   Procedure: ACDF C4-7;  Surgeon: Melina Schools, MD;  Location: [facility];  Service: Orthopedics;  Laterality: N/A;   BIOPSY  05/16/2021   Procedure: BIOPSY;  Surgeon: Eloise Harman, DO;  Location[facility];   Service: Endoscopy;;  gastric erosion   cataract surgery     bilateral   COLONOSCOPY WITH PROPOFOL N/A 11/05/2018   Procedure: COLONOSCOPY WITH PROPOFOL;  Surgeon: Danie Binder, MD;  Location: [facility];  Service: Endoscopy;  Laterality: N/A;  10:30am   ESOPHAGOGASTRODUODENOSCOPY (EGD) WITH PROPOFOL N/A 05/16/2021   Procedure: ESOPHAGOGASTRODUODENOSCOPY (EGD) WITH PROPOFOL;  Surgeon: Eloise Harman, DO;  Location: [facility];  Service: Endoscopy;  Laterality: N/A;   ESOPHAGOGASTRODUODENOSCOPY (EGD) WITH PROPOFOL N/A 05/15/2021   Procedure: ESOPHAGOGASTRODUODENOSCOPY (EGD) WITH PROPOFOL;  Surgeon: Harvel Quale, MD;  Location: [facility];  Service: Gastroenterology;  Laterality: N/A;   HAND SURGERY     rt   KNEE ARTHROPLASTY Right 04/03/2019   Procedure: COMPUTER ASSISTED TOTAL KNEE ARTHROPLASTY;  Surgeon: Rod Can, MD;  Location: [facility];  Service: Orthopedics;  Laterality: Right;   KNEE SURGERY     rt    OB History     Gravida  1   Para  1   Term  1   Preterm      AB      Living  1      SAB      IAB      Ectopic      Multiple      Live Births  1            Home Medications  Prior to Admission medications   Medication Sig Start Date End Date Taking? Authorizing Provider  molnupiravir EUA (LAGEVRIO) 200 mg CAPS capsule Take 4 capsules (800 mg total) by mouth 2 (two) times daily for 5 days. 10/01/21 10/06/21 Yes Volney American, PA-C  promethazine-dextromethorphan (PROMETHAZINE-DM) 6.25-15 MG/5ML syrup Take 5 mLs by mouth 4 (four) times daily as needed. 10/01/21  Yes Volney American, PA-C  acetaminophen (TYLENOL) 325 MG tablet Take 650 mg by mouth every 6 (six) hours as needed.    [provider]  ALPRAZolam Duanne Moron) 1 MG tablet Take 1 mg by mouth at bedtime.    [provider]  atorvastatin (LIPITOR) 20 MG tablet Take 20 mg by mouth at bedtime.     [provider]  doxycycline (VIBRAMYCIN) 100  MG capsule Take 1 capsule (100 mg total) by mouth 2 (two) times daily. One po bid x 7 days 07/30/21   Margarita Mail, PA-C  lisinopril (ZESTRIL) 10 MG tablet Take 15 mg by mouth daily. 05/10/21   [provider]  pantoprazole (PROTONIX) 40 MG tablet Take 1 tablet (40 mg total) by mouth 2 (two) times daily. 05/16/21 05/16/22  Eloise Harman, DO  Pseudoephedrine-Acetaminophen (SINUS PO) Take 1 tablet by mouth daily as needed (sinus/drainage).    [provider]  sertraline (ZOLOFT) 100 MG tablet Take 150 mg by mouth at bedtime.     [provider]  sucralfate (CARAFATE) 1 g tablet Take 1 tablet (1 g total) by mouth 4 (four) times daily for 14 days. 05/16/21 05/30/21  Eloise Harman, DO    Family History Family History  Problem Relation Age of Onset   Kidney disease Father    Brain cancer Mother    Colon polyps Brother        age 48   Colon cancer Neg Hx     Social History Social History   Tobacco Use   Smoking status: Never   Smokeless tobacco: Never  Vaping Use   Vaping Use: Never used  Substance Use Topics   Alcohol use: No   Drug use: No     Allergies   Codeine and Other   Review of Systems Review of Systems PER HPI  Physical Exam Triage Vital Signs ED Triage Vitals [10/01/21 0821]  Enc Vitals Group     BP      Pulse Rate (!) 113     Resp (!) 22     Temp 99.4 F (37.4 C)     Temp Source Oral     SpO2 92 %     Weight      Height      Head Circumference      Peak Flow      Pain Score 8     Pain Loc      Pain Edu?      Excl. in [place]?    No data found.  Updated Vital Signs Pulse (!) 113   Temp 99.4 F (37.4 C) (Oral)   Resp (!) 22   SpO2 95%   Visual Acuity Right Eye Distance:   Left Eye Distance:   Bilateral Distance:    Right Eye Near:   Left Eye Near:    Bilateral Near:     Physical Exam Vitals and nursing note reviewed.  Constitutional:      Appearance: Normal appearance.  HENT:     Head: Atraumatic.     Right  Ear: Tympanic membrane and external ear normal.  Left Ear: Tympanic membrane and external ear normal.     Nose: Rhinorrhea present.     Mouth/Throat:     Mouth: Mucous membranes are moist.     Pharynx: Posterior oropharyngeal erythema present.  Eyes:     Extraocular Movements: Extraocular movements intact.     Conjunctiva/sclera: Conjunctivae normal.  Cardiovascular:     Rate and Rhythm: Normal rate and regular rhythm.     Heart sounds: Normal heart sounds.  Pulmonary:     Effort: Pulmonary effort is normal.     Breath sounds: Normal breath sounds. No wheezing.  Musculoskeletal:        General: Normal range of motion.     Cervical back: Normal range of motion and neck supple.  Skin:    General: Skin is warm and dry.  Neurological:     Mental Status: She is alert and oriented to person, place, and time.  Psychiatric:        Mood and Affect: Mood normal.        Thought Content: Thought content normal.      UC Treatments / Results  Labs (all labs ordered are listed, but only abnormal results are displayed) Labs Reviewed - No data to display  EKG   Radiology No results found.  Procedures Procedures (including critical care time)  Medications Ordered in UC Medications - No data to display  Initial Impression / Assessment and Plan / UC Course  I have reviewed the triage vital signs and the nursing notes.  Pertinent labs & imaging results that were available during my care of the patient were reviewed by me and considered in my medical decision making (see chart for details).     Home COVID +, treat with molnupiravir, phenergan dm, supportive OTC medications and home care. Return for worsening sxs.  Final Clinical Impressions(s) / UC Diagnoses   Final diagnoses:  [id number]   Discharge Instructions   None    ED Prescriptions     Medication Sig Dispense Auth. Provider   molnupiravir EUA (LAGEVRIO) 200 mg CAPS capsule Take 4 capsules (800 mg total) by mouth  2 (two) times daily for 5 days. 40 capsule Volney American, [place]   promethazine-dextromethorphan (PROMETHAZINE-DM) 6.25-15 MG/5ML syrup Take 5 mLs by mouth 4 (four) times daily as needed. 100 mL Volney American, [place]      PDMP not reviewed this encounter.   Volney American, [place] 10/05/21 2222

## 2021-10-25 ENCOUNTER — Other Ambulatory Visit (HOSPITAL_COMMUNITY): Payer: Self-pay | Admitting: Adult Health

## 2021-10-25 ENCOUNTER — Ambulatory Visit (HOSPITAL_COMMUNITY)
Admission: RE | Admit: 2021-10-25 | Discharge: 2021-10-25 | Disposition: A | Discharge status: Home / Self Care | Payer: Medicare PPO | Source: Ambulatory Visit | Attending: Adult Health | Admitting: Adult Health

## 2021-10-25 DIAGNOSIS — Z1231 Encounter for screening mammogram for malignant neoplasm of breast: Secondary | ICD-10-CM | POA: Insufficient documentation

## 2021-10-25 DIAGNOSIS — R928 Other abnormal and inconclusive findings on diagnostic imaging of breast: Secondary | ICD-10-CM

## 2021-10-25 DIAGNOSIS — N6311 Unspecified lump in the right breast, upper outer quadrant: Secondary | ICD-10-CM | POA: Diagnosis not present

## 2021-10-25 DIAGNOSIS — R922 Inconclusive mammogram: Secondary | ICD-10-CM | POA: Insufficient documentation

## 2021-10-25 DIAGNOSIS — N6310 Unspecified lump in the right breast, unspecified quadrant: Secondary | ICD-10-CM | POA: Insufficient documentation

## 2021-10-27 ENCOUNTER — Other Ambulatory Visit (HOSPITAL_COMMUNITY): Payer: Self-pay | Admitting: Internal Medicine

## 2021-10-27 ENCOUNTER — Ambulatory Visit (HOSPITAL_COMMUNITY)
Admission: RE | Admit: 2021-10-27 | Discharge: 2021-10-27 | Disposition: A | Discharge status: Home / Self Care | Payer: Medicare PPO | Source: Ambulatory Visit | Attending: Internal Medicine | Admitting: Internal Medicine

## 2021-10-27 ENCOUNTER — Ambulatory Visit (HOSPITAL_COMMUNITY)
Admission: RE | Admit: 2021-10-27 | Discharge: 2021-10-27 | Disposition: A | Discharge status: Home / Self Care | Payer: Medicare PPO | Source: Ambulatory Visit | Attending: Adult Health | Admitting: Adult Health

## 2021-10-27 DIAGNOSIS — D241 Benign neoplasm of right breast: Secondary | ICD-10-CM | POA: Diagnosis not present

## 2021-10-27 DIAGNOSIS — R928 Other abnormal and inconclusive findings on diagnostic imaging of breast: Secondary | ICD-10-CM | POA: Diagnosis not present

## 2021-10-27 DIAGNOSIS — N6011 Diffuse cystic mastopathy of right breast: Secondary | ICD-10-CM | POA: Diagnosis not present

## 2021-10-27 DIAGNOSIS — N6311 Unspecified lump in the right breast, upper outer quadrant: Secondary | ICD-10-CM | POA: Diagnosis not present

## 2021-10-27 DIAGNOSIS — R920 Mammographic microcalcification found on diagnostic imaging of breast: Secondary | ICD-10-CM | POA: Diagnosis not present

## 2021-10-27 HISTORY — PX: BREAST BIOPSY: SHX20

## 2021-10-28 LAB — SURGICAL PATHOLOGY

## 2021-11-07 DIAGNOSIS — H04123 Dry eye syndrome of bilateral lacrimal glands: Secondary | ICD-10-CM | POA: Diagnosis not present

## 2022-01-19 ENCOUNTER — Ambulatory Visit (INDEPENDENT_AMBULATORY_CARE_PROVIDER_SITE_OTHER): Payer: Medicare PPO

## 2022-01-19 ENCOUNTER — Ambulatory Visit: Payer: Medicare PPO | Admitting: Podiatry

## 2022-01-19 DIAGNOSIS — T148XXA Other injury of unspecified body region, initial encounter: Secondary | ICD-10-CM

## 2022-01-19 DIAGNOSIS — M722 Plantar fascial fibromatosis: Secondary | ICD-10-CM

## 2022-01-19 NOTE — Progress Notes (Signed)
Subjective:   Patient ID: Linda Page, female   DOB: 72 y.o.   MRN: [id number]   HPI Patient states she is still getting a lot of pain in the outside of the left foot and states it simply has not responded even though she has not been here for 6 months.  She has tried to reduce her activities tried other modalities and is now also developing a lot of pain in her plantar heel left   ROS      Objective:  Physical Exam  Neurovascular status remains intact no changes in health history exquisite discomfort left lateral foot near the insertion base of fifth metatarsal and proximal with fusiform swelling of the area and intense unrelenting discomfort that has not responded to numerous conservative treatments that we have utilized and also is now developing acute Planter of inflammation of the fascia just distal to its insertion calcaneus     Assessment:  Continue strong possibility of either chronic thickening of the peroneal tendon group versus the possibility for interstitial tearing or a change in the condition over the last 10 months with Planter fasciitis left     Plan:  Reviewed both conditions and due to the fact it has been 10 months and its worsened on get a go ahead and reorder the MRI left to try to ascertain as to whether or not there is been change or increase in the pathology around the tendon with the probability that exploration and probable repair will be necessary and I am referring her to Dr. Sherryle Lis after discussing with him.  I did go ahead today and I did do sterile prep and injected the plantar fascia 3 mg Kenalog 5 mg Xylocaine  X-rays indicate slight reactivity around the base of the fifth metatarsal left but nothing that would explain the pain the patient is getting

## 2022-02-07 ENCOUNTER — Encounter: Payer: Self-pay | Admitting: Podiatry

## 2022-02-08 ENCOUNTER — Ambulatory Visit
Admission: RE | Admit: 2022-02-08 | Discharge: 2022-02-08 | Disposition: A | Discharge status: Home / Self Care | Payer: Medicare PPO | Source: Ambulatory Visit | Attending: Podiatry | Admitting: Podiatry

## 2022-02-08 DIAGNOSIS — T148XXA Other injury of unspecified body region, initial encounter: Secondary | ICD-10-CM

## 2022-02-08 DIAGNOSIS — M722 Plantar fascial fibromatosis: Secondary | ICD-10-CM | POA: Diagnosis not present

## 2022-02-08 DIAGNOSIS — S99922A Unspecified injury of left foot, initial encounter: Secondary | ICD-10-CM | POA: Diagnosis not present

## 2022-02-15 ENCOUNTER — Ambulatory Visit: Payer: Medicare PPO | Admitting: Podiatry

## 2022-02-15 DIAGNOSIS — M7672 Peroneal tendinitis, left leg: Secondary | ICD-10-CM

## 2022-02-15 DIAGNOSIS — M62462 Contracture of muscle, left lower leg: Secondary | ICD-10-CM | POA: Diagnosis not present

## 2022-02-15 DIAGNOSIS — M722 Plantar fascial fibromatosis: Secondary | ICD-10-CM | POA: Diagnosis not present

## 2022-02-16 ENCOUNTER — Telehealth: Payer: Self-pay | Admitting: Urology

## 2022-02-16 NOTE — Telephone Encounter (Signed)
DOS - 03/10/22  [employer]  Doesn't require authorization  [phone] - Endoscopic plantar fasciotomy [id number]- Gastrocnemius recession (eg, Strayer procedure)

## 2022-02-19 ENCOUNTER — Encounter: Payer: Self-pay | Admitting: Podiatry

## 2022-02-19 NOTE — Progress Notes (Signed)
  Subjective:  Patient ID: Linda Page, female    DOB: 05-30-1950,  MRN: [id number]  Chief Complaint  Patient presents with   Plantar Fasciitis    mri results/ ref by Dr Paulla Dolly    72 y.o. female presents with the above complaint. History confirmed with patient.  She is referred by Dr. Paulla Dolly.  She has been dealing this for quite a long time.  She has had boots and bracing, physical therapy and an injection in the heel.  Overall continues to have pain on the bottom of the heel and the instep outside of the ankle up into the calf.  She completed MRI recently.  Objective:  Physical Exam: warm, good capillary refill, no trophic changes or ulcerative lesions, normal DP and PT pulses, normal sensory exam, and pain tenderness and edema around the peroneal tendons on the left lower extremity distal to the malleolus, there is minimal strength deficits, she has tenderness to palpation of the plantar fascia, she has gastrocnemius equinus  IMPRESSION: 1. Moderate tendinosis of the peroneus longus and brevis tendons without tear. 2. Plantar fasciitis. No tear. 3. 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     Electronically Signed   By: Davina Poke D.O.   On: 02/09/2022 09:51   Assessment:   1. Plantar fasciitis   2. Peroneal tendinitis of left lower extremity   3. Gastrocnemius equinus of left lower extremity      Plan:  Patient was evaluated and treated and all questions answered.  We reviewed the results of her MRI together including the images, I discussed with her that there is some tendinosis but minimal tenosynovitis and no tearing noted of the peroneal tendons.  Hopefully with therapy time and a platelet rich plasma injections this would be able to be manageable and heal.  Her primary issue appears to be related to her significant equinus and plantar fasciitis.  We discussed surgical intervention.  She has exhausted all nonsurgical treatment options at this point.   Surgical we discussed a gastrocnemius recession and plantar fasciotomy both were endoscopic approaches and a platelet rich plasma injection along the peroneal tendon sheath.  We discussed the risk benefits and potential complications of this procedure including not limited to  pain, swelling, infection, scar, numbness which may be temporary or permanent, chronic pain, stiffness, nerve pain or damage, wound healing problems.  She is understands and wishes to proceed.  Surgical be scheduled at a mutually agreeable date   Surgical plan:  Procedure: -Left foot EPF, EGR, PRP injection of peroneals  Location: -[facility]  Anesthesia plan: -IV sedation with a regional block  Postoperative pain plan: - Tylenol 1000 mg every 6 hours,  gabapentin 300 mg every 8 hours x5 days, oxycodone 5 mg 1-2 tabs every 6 hours only as needed  DVT prophylaxis: -None required  WB Restrictions / DME needs: -WBAT in CAM boot postop    Return for after surgery.

## 2022-03-10 ENCOUNTER — Other Ambulatory Visit: Payer: Self-pay | Admitting: Podiatry

## 2022-03-10 DIAGNOSIS — M216X2 Other acquired deformities of left foot: Secondary | ICD-10-CM | POA: Diagnosis not present

## 2022-03-10 DIAGNOSIS — G8918 Other acute postprocedural pain: Secondary | ICD-10-CM | POA: Diagnosis not present

## 2022-03-10 DIAGNOSIS — M722 Plantar fascial fibromatosis: Secondary | ICD-10-CM | POA: Diagnosis not present

## 2022-03-10 HISTORY — PX: PLANTAR FASCIA RELEASE: SHX2239

## 2022-03-10 MED ORDER — OXYCODONE HCL 5 MG PO TABS: 5.0000 mg | ORAL_TABLET | ORAL | 0 refills | Status: AC | PRN

## 2022-03-10 MED ORDER — ACETAMINOPHEN 500 MG PO TABS: 1000.0000 mg | ORAL_TABLET | Freq: Four times a day (QID) | ORAL | 0 refills | Status: AC | PRN

## 2022-03-10 MED ORDER — GABAPENTIN 300 MG PO CAPS
300.0000 mg | ORAL_CAPSULE | Freq: Three times a day (TID) | ORAL | 0 refills | Status: DC
Start: 2022-03-10 — End: 2022-08-22

## 2022-03-10 NOTE — Progress Notes (Signed)
03/10/22 EGR EPF and PRP peroneal injection

## 2022-03-13 ENCOUNTER — Telehealth: Payer: Self-pay | Admitting: Podiatry

## 2022-03-13 NOTE — Telephone Encounter (Signed)
Patient is aware and will come to the office on Thursday.

## 2022-03-13 NOTE — Telephone Encounter (Signed)
Pt had sx on 03/10/22 and was put in a short boot but states that its caused pain to the back of her leg over the weekend. Pt wants to know if she can switch over to the tall boot?  Please advise

## 2022-03-14 ENCOUNTER — Encounter: Payer: Self-pay | Admitting: Podiatry

## 2022-03-14 NOTE — Progress Notes (Signed)
DOS 03/10/2022 Left plantar fascial release, calf muscle lengthening, platelet rich plasma injection

## 2022-03-16 ENCOUNTER — Ambulatory Visit (INDEPENDENT_AMBULATORY_CARE_PROVIDER_SITE_OTHER): Payer: Medicare PPO | Admitting: Podiatry

## 2022-03-16 DIAGNOSIS — M62462 Contracture of muscle, left lower leg: Secondary | ICD-10-CM

## 2022-03-16 DIAGNOSIS — M7672 Peroneal tendinitis, left leg: Secondary | ICD-10-CM

## 2022-03-16 DIAGNOSIS — M722 Plantar fascial fibromatosis: Secondary | ICD-10-CM

## 2022-03-16 MED ORDER — CEPHALEXIN 500 MG PO CAPS: 500.0000 mg | ORAL_CAPSULE | Freq: Three times a day (TID) | ORAL | 0 refills | Status: DC

## 2022-03-20 ENCOUNTER — Encounter: Payer: Self-pay | Admitting: Podiatry

## 2022-03-20 NOTE — Progress Notes (Signed)
  Subjective:  Patient ID: Linda Page, female    DOB: 1950/05/28,  MRN: [id number]  Chief Complaint  Patient presents with   Routine Post Op    POV #1 DOS 03/10/2022 LT EPF, CALF MUSCLE LENGTHENING, PRP INJECTION     72 y.o. female returns for post-op check.  She is doing well she is having some pain in the back of the calf feels that the boot may be taking in here  Review of Systems: Negative except as noted in the HPI. Denies N/V/F/Ch.   Objective:  There were no vitals filed for this visit. There is no height or weight on file to calculate BMI. Constitutional Well developed. Well nourished.  Vascular Foot warm and well perfused. Capillary refill normal to all digits.  Calf is soft and supple, no posterior calf or knee pain, negative Homans' sign  Neurologic Normal speech. Oriented to person, place, and time. Epicritic sensation to light touch grossly present bilaterally.  Dermatologic Skin healing well without signs of infection. Skin edges well coapted without signs of infection.  Some maceration noted around incision and plantar heel  Orthopedic: Tenderness to palpation noted about the surgical site.    Assessment:   1. Plantar fasciitis   2. Peroneal tendinitis of left lower extremity   3. Gastrocnemius equinus of left lower extremity    Plan:  Patient was evaluated and treated and all questions answered.  S/p foot surgery left -Progressing as expected post-operatively.  She did have some maceration, does not think that she got the dressing wet, possible she is having this due to sweat.  I placed her on cephalexin as a precaution to prevent any infection from developing  -WB Status: WBAT in CAM boot.  Will continue this until postop visit 3 -Sutures: Removed in 2 weeks. -Medications: Cephalexin 500 mg 3 times daily sent to [place] [place] redressed.  May begin bathing next week. -Rx sent to [facility] for an outpatient therapy that she will begin ASAP  No follow-ups on  file.

## 2022-03-30 ENCOUNTER — Ambulatory Visit (INDEPENDENT_AMBULATORY_CARE_PROVIDER_SITE_OTHER): Payer: Medicare PPO | Admitting: Podiatry

## 2022-03-30 DIAGNOSIS — M62462 Contracture of muscle, left lower leg: Secondary | ICD-10-CM

## 2022-03-30 DIAGNOSIS — M722 Plantar fascial fibromatosis: Secondary | ICD-10-CM

## 2022-03-30 DIAGNOSIS — M7672 Peroneal tendinitis, left leg: Secondary | ICD-10-CM

## 2022-03-30 MED ORDER — DOXYCYCLINE HYCLATE 100 MG PO CAPS: 100.0000 mg | ORAL_CAPSULE | Freq: Two times a day (BID) | ORAL | 0 refills | Status: DC

## 2022-03-30 NOTE — Progress Notes (Signed)
Patient presents today for post op visit # 2 , patient of Dr. Sherryle Lis    POV #2 [place] 03/10/2022   Patient presents in [facility] Denies any falls or injury to the foot. Foot is slightly swollen. The medial heel incision is red and swollen. No calf pain or shortness of breath. Bandages dry and intact. Incision is intact.  Taking antibiotic as prescribed and pain medication patient's sutures were removed today.   BP: 156/86 P: 82     Xrays taken today and reviewed by Dr. Sherryle Lis. He did take a look at the foot today as well.   Foot redressed today and placed back in the boot. Reviewed icing and elevation. Patient will follow up with Dr. Sherryle Lis for POV# 3 in one week and continue antibiotic.

## 2022-04-02 NOTE — Progress Notes (Signed)
Overall doing well still having some redness on the medial incision, I will continue her on doxycycline suspect most of this is inflammatory.  Will reevaluate her in 1 week

## 2022-04-05 ENCOUNTER — Ambulatory Visit (HOSPITAL_COMMUNITY): Payer: Medicare PPO | Attending: Podiatry

## 2022-04-05 ENCOUNTER — Encounter (HOSPITAL_COMMUNITY): Payer: Self-pay

## 2022-04-05 ENCOUNTER — Other Ambulatory Visit: Payer: Self-pay

## 2022-04-05 DIAGNOSIS — M7672 Peroneal tendinitis, left leg: Secondary | ICD-10-CM | POA: Insufficient documentation

## 2022-04-05 DIAGNOSIS — Z7409 Other reduced mobility: Secondary | ICD-10-CM | POA: Diagnosis not present

## 2022-04-05 DIAGNOSIS — M722 Plantar fascial fibromatosis: Secondary | ICD-10-CM | POA: Insufficient documentation

## 2022-04-05 DIAGNOSIS — M62462 Contracture of muscle, left lower leg: Secondary | ICD-10-CM | POA: Insufficient documentation

## 2022-04-05 DIAGNOSIS — M25672 Stiffness of left ankle, not elsewhere classified: Secondary | ICD-10-CM | POA: Insufficient documentation

## 2022-04-05 DIAGNOSIS — M25671 Stiffness of right ankle, not elsewhere classified: Secondary | ICD-10-CM | POA: Diagnosis not present

## 2022-04-05 NOTE — Therapy (Signed)
OUTPATIENT PHYSICAL THERAPY LOWER EXTREMITY EVALUATION   Patient Name: Linda Page MRN: [id number] DOB:Jun 05, 1950, 72 y.o., female Today's Date: 04/05/2022  END OF SESSION:  PT End of Session - 04/05/22 1138     Visit Number 1    Authorization Type [employer]    Authorization Time Period Seeking Authorization    Authorization - Visit Number 1    PT Start Time [id number]    PT Stop Time 1117    PT Time Calculation (min) 42 min    Activity Tolerance Patient tolerated treatment well    Behavior During Therapy WFL for tasks assessed/performed             Past Medical History:  Diagnosis Date   Anxiety    Arthritis    in neck, in knee has gel shots givin in knee once a week for 3 weeks   BV (bacterial vaginosis) 04/06/2021   /329/23 rx flagyl   Cervical high risk HPV (human papillomavirus) test positive 04/06/2021   Repeat pap in 1 year per ASCCP guidelines 5 year risk for CIN3+ 4.8%   Depression    Hypertension    Past Surgical History:  Procedure Laterality Date   ANTERIOR CERVICAL DECOMP/DISCECTOMY FUSION N/A 04/29/2015   Procedure: ACDF C4-7;  Surgeon: Melina Schools, MD;  Location: [facility];  Service: Orthopedics;  Laterality: N/A;   BIOPSY  05/16/2021   Procedure: BIOPSY;  Surgeon: Eloise Harman, DO;  [facility];  Service: Endoscopy;;  gastric erosion   cataract surgery     bilateral   COLONOSCOPY WITH PROPOFOL N/A 11/05/2018   Procedure: COLONOSCOPY WITH PROPOFOL;  Surgeon: Danie Binder, MD;  [facility];  Service: Endoscopy;  Laterality: N/A;  10:30am   ESOPHAGOGASTRODUODENOSCOPY (EGD) WITH PROPOFOL N/A 05/16/2021   Procedure: ESOPHAGOGASTRODUODENOSCOPY (EGD) WITH PROPOFOL;  Surgeon: Eloise Harman, DO;  [facility];  Service: Endoscopy;  Laterality: N/A;   ESOPHAGOGASTRODUODENOSCOPY (EGD) WITH PROPOFOL N/A 05/15/2021   Procedure: ESOPHAGOGASTRODUODENOSCOPY (EGD) WITH PROPOFOL;  Surgeon: Harvel Quale, MD;   [facility];  Service: Gastroenterology;  Laterality: N/A;   HAND SURGERY     rt   KNEE ARTHROPLASTY Right 04/03/2019   Procedure: COMPUTER ASSISTED TOTAL KNEE ARTHROPLASTY;  Surgeon: Rod Can, MD;  Location: [facility];  Service: Orthopedics;  Laterality: Right;   KNEE SURGERY     rt   Patient Active Problem List   Diagnosis Date Noted   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   Upper abdominal pain    Gastric foreign body    BV (bacterial vaginosis) 04/06/2021   Cervical high risk HPV (human papillomavirus) test positive 04/06/2021   Encounter for gynecological examination with Papanicolaou smear of cervix 03/31/2021   Encounter for screening fecal occult blood testing 03/31/2021   Screening mammogram for breast cancer 03/31/2021   Osteoarthritis of right knee 04/03/2019   Preventative health care 08/12/2018   Neck pain 04/29/2015   Degeneration of cervical intervertebral disc 09/24/2013   Pain in joint, shoulder region 09/24/2013   Muscle weakness (generalized) 09/24/2013   Decreased range of motion of neck 09/24/2013    PCP: Celene Squibb MD  REFERRING PROVIDER: Lanae Crumbly DPM  REFERRING DIAG:  M72.2 (ICD-10-CM) - Plantar fasciitis  M76.72 (ICD-10-CM) - Peroneal tendinitis of left lower extremity  M62.462 (ICD-10-CM) - Gastrocnemius equinus of left lower extremity    THERAPY DIAG:  Plantar fasciitis of left foot  Decreased range of motion of both ankles  Impaired  functional mobility, balance, gait, and endurance  Rationale for Evaluation and Treatment: Rehabilitation  ONSET DATE: 4.5 years ago  SUBJECTIVE:   SUBJECTIVE STATEMENT: Foot started bothering er 4.63yrs ago and had a "hair fracture." MD put pt in boot. Pt reports "nail driving through foot" and was swelling throughout day. Potential hammer toe relation. Specialist wanted to pt in another boot but pt denied. March 1st of this year Left plantar fascial release. Pt has to wear boot at all times.    PERTINENT HISTORY: Osteoarthritis of right knee  Degeneration of cervical intervertebral disc  Pain in joint, shoulder region  Muscle weakness (generalized)  Decreased range of motion of neck  Neck pain  Preventative health care  More...  Encounter for gynecological examination with Papanicolaou smear of cervix  Encounter for screening fecal occult blood testing  Screening mammogram for breast cancer  BV (bacterial vaginosis)  Overview Signed 04/06/2021  2:53 PM by Estill Dooms, NP   /329/23 rx flagyl    Cervical high risk HPV (human papillomavirus) test positive  Overview Signed 04/06/2021  2:56 PM by Estill Dooms, NP   Repeat pap in 1 year per ASCCP guidelines 5 year risk for CIN3+ 4.8%    Upper abdominal pain  Gastric foreign body  Gastric erosion   PAIN:  Are you having pain? Yes: NPRS scale: 5/10 Pain location: Left foot and blister Pain description: "just hurts" Aggravating factors: lot of walking Relieving factors: Pain medications, very little ice  PRECAUTIONS: Other: WBAT in CAM boot  WEIGHT BEARING RESTRICTIONS: Yes WBAT  FALLS:  Has patient fallen in last 6 months? Yes. Number of falls 1, just caught up in rug.   LIVING ENVIRONMENT: Lives with: lives with their daughter Lives in: House/apartment Stairs: No Has following equipment at home: None  OCCUPATION: Retired  PLOF: Independent  PATIENT GOALS: "being able to wear tennis shoes back on"  NEXT MD VISIT: 04/06/2022  OBJECTIVE:   DIAGNOSTIC FINDINGS: IMPRESSION: 1. Moderate tendinosis of the peroneus longus and brevis tendons without tear. 2. Plantar fasciitis. No tear. 3. 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  PATIENT SURVEYS:  FOTO 64.5%  COGNITION: Overall cognitive status: Within functional limits for tasks assessed     SENSATION: WFL    POSTURE: rounded shoulders and forward head  PALPATION: No TTP  LOWER EXTREMITY ROM:  Active ROM  Right eval Left eval  Hip flexion    Hip extension    Hip abduction    Hip adduction    Hip internal rotation    Hip external rotation    Knee flexion    Knee extension    Ankle dorsiflexion KS: -9; -5 KS:5;15  Ankle plantarflexion    Ankle inversion 35 35  Ankle eversion 2 20   (Blank rows = not tested)  LOWER EXTREMITY MMT:  MMT Right eval Left eval  Hip flexion    Hip extension    Hip abduction    Hip adduction    Hip internal rotation    Hip external rotation    Knee flexion    Knee extension 4+ 4-  Ankle dorsiflexion 4+ 4-  Ankle plantarflexion 3 3  Ankle inversion 4+ 4+  Ankle eversion 3+ 3+   (Blank rows = not tested)  LOWER EXTREMITY SPECIAL TESTS:    FUNCTIONAL TESTS:  5 times sit to stand: 13.6 seconds 2 minute walk test: 271ft 10 meter walk test: 9 seconds  GAIT: Distance walked: 264ft Assistive device utilized:  Cam  boot Level of assistance: Complete Independence Comments: antalgic gait with LLE, mild bilateral trendelenberg, small LOB with ambulation, independent recovery.    TODAY'S TREATMENT:                                                                                                                              DATE: 04/05/2022 PT Evaluation, HEP    PATIENT EDUCATION:  Education details: HEP; bandaid covering for blister; and HEP.  Person educated: Patient Education method: Customer service manager Education comprehension: verbalized understanding and returned demonstration  HOME EXERCISE PROGRAM: Access Code: [email or web address] URL: [email or web address] Date: 04/05/2022 Prepared by: Alveda Reasons  Exercises - Ankle Eversion with Resistance  - 1 x daily - 7 x weekly - 3 sets - 10 reps - Ankle Inversion with Resistance  - 1 x daily - 7 x weekly - 3 sets - 10 reps - Ankle Dorsiflexion with Resistance  - 1 x daily - 7 x weekly - 3 sets - 10 reps - Towel Scrunches  - 1 x daily - 7 x weekly - 3 sets - 10 reps - Gastroc  Stretch on Wall  - 1 x daily - 7 x weekly - 3 sets - 10 reps  ASSESSMENT:  CLINICAL IMPRESSION: Pt is a pleasant 72 year old female who is presenting to physical therapy today for L ankle pain and plantar fasciitis. Shannie is referred to physical therapy by referred for Peroneal tendinitis of LLE and Gastrocnemisu equinius of LLE. Pt's past medical history includes: R TKA. Pt currently is in CAM boot WBAT had lengthening of L gastrocnemius musculature on 03/16/22. No ROM restrictions present upon chart review.   Based upon today's evaluation, pt is demonstrating reduced functional mobility capcaity, mild balance deficits due to  ROM deficits, muscle weakness, and pain. Mandolyn would benefit from skilled physical therapy services to address the above impairments/limitations and improve overall functional capacity and QOL. .   OBJECTIVE IMPAIRMENTS: Abnormal gait, decreased activity tolerance, decreased endurance, decreased mobility, difficulty walking, decreased ROM, and decreased strength.   ACTIVITY LIMITATIONS: lifting, bending, standing, and locomotion level  PARTICIPATION LIMITATIONS: community activity, occupation, and yard work  PERSONAL FACTORS: Age and Past/current experiences are also affecting patient's functional outcome.   REHAB POTENTIAL: Good  CLINICAL DECISION MAKING: Stable/uncomplicated  EVALUATION COMPLEXITY: Low   GOALS: Goals reviewed with patient? No  SHORT TERM GOALS: Target date: 04/19/2022 Pt will be independent with HEP in order to demonstrate participation in Physical Therapy POC.  Baseline: See HEP section Goal status: INITIAL  2.  Pt will report < 5/10 for pain in Left ankle to demonstrate improved functional activity tolerance Baseline: 5/10 Goal status: INITIAL   LONG TERM GOALS: Target date: 05/06/2022  Pt will improve LLE ankle MMT > 4/5 in L to improve functional capacity tolerance. Baseline: see objective. Goal status: INITIAL  2.  Pt will  increase > 325ft in 2MWT to demonstrate improve functional capacity and activity tolerance. Baseline:  see objective Goal status: INITIAL  3.  Pt will increased 5x STS to < 11 seconds to demonstrate increased functional LE strength.  Baseline: Assess next session.  Goal status: INITIAL  4.  Pt will improve FOTO > 8 points to demonstrate improvements in overall functional output.  Baseline: see objective.  Goal status: INITIAL    PLAN:  PT FREQUENCY: 2x/week  PT DURATION: 4 weeks  PLANNED INTERVENTIONS: Therapeutic exercises, Therapeutic activity, Neuromuscular re-education, Balance training, Gait training, Patient/Family education, Self Care, Joint mobilization, and Re-evaluation  PLAN FOR NEXT SESSION: Single leg balance once out of cam boot; intrinsic foot strengthening, calf stretching.    Wonda Olds, PT 04/05/2022, 11:40 AM

## 2022-04-06 ENCOUNTER — Ambulatory Visit (INDEPENDENT_AMBULATORY_CARE_PROVIDER_SITE_OTHER): Payer: Medicare PPO | Admitting: Podiatry

## 2022-04-06 DIAGNOSIS — M7672 Peroneal tendinitis, left leg: Secondary | ICD-10-CM

## 2022-04-06 DIAGNOSIS — M722 Plantar fascial fibromatosis: Secondary | ICD-10-CM

## 2022-04-06 DIAGNOSIS — M62462 Contracture of muscle, left lower leg: Secondary | ICD-10-CM

## 2022-04-06 NOTE — Progress Notes (Signed)
  Subjective:  Patient ID: Linda Page, female    DOB: 12-20-1950,  MRN: [id number]  Chief Complaint  Patient presents with   Routine Post Op    POV #3 DOS 03/10/2022 LT EPF, CALF MUSCLE LENGTHENING, PRP INJECTION     72 y.o. female returns for post-op check.  She is doing much better  Review of Systems: Negative except as noted in the HPI. Denies N/V/F/Ch.   Objective:  There were no vitals filed for this visit. There is no height or weight on file to calculate BMI. Constitutional Well developed. Well nourished.  Vascular Foot warm and well perfused. Capillary refill normal to all digits.  Calf is soft and supple, no posterior calf or knee pain, negative Homans' sign  Neurologic Normal speech. Oriented to person, place, and time. Epicritic sensation to light touch grossly present bilaterally.  Dermatologic No maceration or erythema today  Orthopedic: No pain to palpation today    Assessment:   1. Plantar fasciitis   2. Gastrocnemius equinus of left lower extremity   3. Peroneal tendinitis of left lower extremity    Plan:  Patient was evaluated and treated and all questions answered.  S/p foot surgery left -Doing much better she will continue physical therapy just charted recently.  She may be WBAT out of the boot for therapy.  Focus on strength and conditioning which will take some time.  Gradually transition out of boot over the next few weeks and when I see her back expect she will be able to go back to regular shoe gear and activity.  Return in about 2 weeks (around 04/20/2022) for post op (no x-rays).

## 2022-04-10 ENCOUNTER — Ambulatory Visit (HOSPITAL_COMMUNITY): Payer: Medicare PPO | Attending: Podiatry

## 2022-04-10 DIAGNOSIS — M25671 Stiffness of right ankle, not elsewhere classified: Secondary | ICD-10-CM | POA: Diagnosis not present

## 2022-04-10 DIAGNOSIS — M722 Plantar fascial fibromatosis: Secondary | ICD-10-CM | POA: Insufficient documentation

## 2022-04-10 DIAGNOSIS — M25672 Stiffness of left ankle, not elsewhere classified: Secondary | ICD-10-CM | POA: Insufficient documentation

## 2022-04-10 DIAGNOSIS — Z7409 Other reduced mobility: Secondary | ICD-10-CM | POA: Insufficient documentation

## 2022-04-10 NOTE — Therapy (Signed)
OUTPATIENT PHYSICAL THERAPY LOWER EXTREMITY TREATMENT   Patient Name: LUDA VANDEPUTTE MRN: [id number] DOB:12/04/1950, 72 y.o., female Today's Date: 04/10/2022  END OF SESSION:  PT End of Session - 04/10/22 0947     Visit Number 2    Number of Visits 8    Date for PT Re-Evaluation 05/02/22    Authorization Type [employer]    Authorization Time Period 1 re-eval and 8 visits from 04/05/22 to 05/02/22    Authorization - Visit Number 2    Progress Note Due on Visit 8    PT Start Time [phone]    PT Stop Time 1026    PT Time Calculation (min) 40 min    Activity Tolerance Patient tolerated treatment well    Behavior During Therapy [facility] for tasks assessed/performed             Past Medical History:  Diagnosis Date   Anxiety    Arthritis    in neck, in knee has gel shots givin in knee once a week for 3 weeks   BV (bacterial vaginosis) 04/06/2021   /329/23 rx flagyl   Cervical high risk HPV (human papillomavirus) test positive 04/06/2021   Repeat pap in 1 year per ASCCP guidelines 5 year risk for CIN3+ 4.8%   Depression    Hypertension    Past Surgical History:  Procedure Laterality Date   ANTERIOR CERVICAL DECOMP/DISCECTOMY FUSION N/A 04/29/2015   Procedure: ACDF C4-7;  Surgeon: Melina Schools, MD;  Location: [facility];  Service: Orthopedics;  Laterality: N/A;   BIOPSY  05/16/2021   Procedure: BIOPSY;  Surgeon: Eloise Harman, DO;  [facility];  Service: Endoscopy;;  gastric erosion   cataract surgery     bilateral   COLONOSCOPY WITH PROPOFOL N/A 11/05/2018   Procedure: COLONOSCOPY WITH PROPOFOL;  Surgeon: Danie Binder, MD;  [facility];  Service: Endoscopy;  Laterality: N/A;  10:30am   ESOPHAGOGASTRODUODENOSCOPY (EGD) WITH PROPOFOL N/A 05/16/2021   Procedure: ESOPHAGOGASTRODUODENOSCOPY (EGD) WITH PROPOFOL;  Surgeon: Eloise Harman, DO;  [facility];  Service: Endoscopy;  Laterality: N/A;   ESOPHAGOGASTRODUODENOSCOPY (EGD) WITH PROPOFOL N/A  05/15/2021   Procedure: ESOPHAGOGASTRODUODENOSCOPY (EGD) WITH PROPOFOL;  Surgeon: Harvel Quale, MD;  [facility];  Service: Gastroenterology;  Laterality: N/A;   HAND SURGERY     rt   KNEE ARTHROPLASTY Right 04/03/2019   Procedure: COMPUTER ASSISTED TOTAL KNEE ARTHROPLASTY;  Surgeon: Rod Can, MD;  [facility];  Service: Orthopedics;  Laterality: Right;   KNEE SURGERY     rt   Patient Active Problem List   Diagnosis Date Noted   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   Upper abdominal pain    Gastric foreign body    BV (bacterial vaginosis) 04/06/2021   Cervical high risk HPV (human papillomavirus) test positive 04/06/2021   Encounter for gynecological examination with Papanicolaou smear of cervix 03/31/2021   Encounter for screening fecal occult blood testing 03/31/2021   Screening mammogram for breast cancer 03/31/2021   Osteoarthritis of right knee 04/03/2019   Preventative health care 08/12/2018   Neck pain 04/29/2015   Degeneration of cervical intervertebral disc 09/24/2013   Pain in joint, shoulder region 09/24/2013   Muscle weakness (generalized) 09/24/2013   Decreased range of motion of neck 09/24/2013    PCP: Celene Squibb MD  REFERRING PROVIDER: Lanae Crumbly DPM  REFERRING DIAG:  M72.2 (ICD-10-CM) - Plantar fasciitis  M76.72 (ICD-10-CM) - Peroneal tendinitis of left lower extremity  M62.462 (ICD-10-CM) - Gastrocnemius equinus of left lower extremity    THERAPY DIAG:  Plantar fasciitis of left foot  Decreased range of motion of both ankles  Impaired functional mobility, balance, gait, and endurance  Rationale for Evaluation and Treatment: Rehabilitation  ONSET DATE: 4.5 years ago  SUBJECTIVE:   SUBJECTIVE STATEMENT:  "Right thigh is sore" tried sleeping without boot as MD told her she could take it off some at night but she ends up putting it back in ; feels like boot is getting heavier daily.    EVAL:Foot started bothering er  4.74yrs ago and had a "hair fracture." MD put pt in boot. Pt reports "nail driving through foot" and was swelling throughout day. Potential hammer toe relation. Specialist wanted to pt in another boot but pt denied. March 1st of this year Left plantar fascial release. Pt has to wear boot at all times.   PERTINENT HISTORY: Osteoarthritis of right knee  Degeneration of cervical intervertebral disc  Pain in joint, shoulder region  Muscle weakness (generalized)  Decreased range of motion of neck  Neck pain  Preventative health care  More...  Encounter for gynecological examination with Papanicolaou smear of cervix  Encounter for screening fecal occult blood testing  Screening mammogram for breast cancer  BV (bacterial vaginosis)  Overview Signed 04/06/2021  2:53 PM by Estill Dooms, NP   /329/23 rx flagyl    Cervical high risk HPV (human papillomavirus) test positive  Overview Signed 04/06/2021  2:56 PM by Estill Dooms, NP   Repeat pap in 1 year per ASCCP guidelines 5 year risk for CIN3+ 4.8%    Upper abdominal pain  Gastric foreign body  Gastric erosion   PAIN:  Are you having pain? Yes: NPRS scale: 5/10 Pain location: Left foot and blister Pain description: "just hurts" Aggravating factors: lot of walking Relieving factors: Pain medications, very little ice  PRECAUTIONS: Other: WBAT in CAM boot  WEIGHT BEARING RESTRICTIONS: Yes WBAT  FALLS:  Has patient fallen in last 6 months? Yes. Number of falls 1, just caught up in rug.   LIVING ENVIRONMENT: Lives with: lives with their daughter Lives in: House/apartment Stairs: No Has following equipment at home: None  OCCUPATION: Retired  PLOF: Independent  PATIENT GOALS: "being able to wear tennis shoes back on"  NEXT MD VISIT: 04/06/2022  OBJECTIVE:   DIAGNOSTIC FINDINGS: IMPRESSION: 1. Moderate tendinosis of the peroneus longus and brevis tendons without tear. 2. Plantar fasciitis. No tear. 3. Moderate  osteoarthritis of the posterior subtalar joint  PATIENT SURVEYS:  FOTO 64.5%  COGNITION: Overall cognitive status: Within functional limits for tasks assessed     SENSATION: WFL    POSTURE: rounded shoulders and forward head  PALPATION: No TTP  LOWER EXTREMITY ROM:  Active ROM Right eval Left eval  Hip flexion    Hip extension    Hip abduction    Hip adduction    Hip internal rotation    Hip external rotation    Knee flexion    Knee extension    Ankle dorsiflexion KS: -9; -5 KS:5;15  Ankle plantarflexion    Ankle inversion 35 35  Ankle eversion 2 20   (Blank rows = not tested)  LOWER EXTREMITY MMT:  MMT Right eval Left eval  Hip flexion    Hip extension    Hip abduction    Hip adduction    Hip internal rotation    Hip external rotation    Knee flexion  Knee extension 4+ 4-  Ankle dorsiflexion 4+ 4-  Ankle plantarflexion 3 3  Ankle inversion 4+ 4+  Ankle eversion 3+ 3+   (Blank rows = not tested)  LOWER EXTREMITY SPECIAL TESTS:    FUNCTIONAL TESTS:  5 times sit to stand: 13.6 seconds 2 minute walk test: 213ft 10 meter walk test: 9 seconds  GAIT: Distance walked: 253ft Assistive device utilized:  Cam boot Level of assistance: Complete Independence Comments: antalgic gait with LLE, mild bilateral trendelenberg, small LOB with ambulation, independent recovery.    TODAY'S TREATMENT:                                                                                                                              DATE:  04/10/2022 Review of HEP and goals Seated:  toe crunches x 2' Marble pick up x 2' Heel slides x 10 for dorsiflexion 4 way red theraband x 10 each Tennis ball plantar fascia massage x 2' Calf/hamstring stretch 5 x 20" with strap   04/05/22 PT Evaluation, HEP    PATIENT EDUCATION:  Education details: HEP; bandaid covering for blister; and HEP.  Person educated: Patient Education method: Customer service manager Education  comprehension: verbalized understanding and returned demonstration  HOME EXERCISE PROGRAM: Access Code: [email or web address] URL: [email or web address] Date: 04/05/2022 Prepared by: Alveda Reasons  Exercises - Ankle Eversion with Resistance  - 1 x daily - 7 x weekly - 3 sets - 10 reps - Ankle Inversion with Resistance  - 1 x daily - 7 x weekly - 3 sets - 10 reps - Ankle Dorsiflexion with Resistance  - 1 x daily - 7 x weekly - 3 sets - 10 reps - Towel Scrunches  - 1 x daily - 7 x weekly - 3 sets - 10 reps - Gastroc Stretch on Wall  - 1 x daily - 7 x weekly - 3 sets - 10 reps  ASSESSMENT:  CLINICAL IMPRESSION: Today's session started with a review of HEP and goals.  Patient verbalizes agreement with set rehab goals.  Added towel crunches and marble pick up to work on strengthening foot intrinsics. Added tennis ball massage x 2' to plantar fascia. Patient with good challenge with marble pick up; only able to pick up about 8-10 marbles in 5 minutes.   Patient will benefit from continued skilled therapy services to address deficits and promote return to normal function    Pt is a pleasant 72 year old female who is presenting to physical therapy today for L ankle pain and plantar fasciitis. Zoeylynn is referred to physical therapy by referred for Peroneal tendinitis of LLE and Gastrocnemisu equinius of LLE. Pt's past medical history includes: R TKA. Pt currently is in CAM boot WBAT had lengthening of L gastrocnemius musculature on 03/16/22. No ROM restrictions present upon chart review.   Based upon today's evaluation, pt is demonstrating reduced functional mobility capcaity, mild balance deficits due to  ROM deficits, muscle weakness, and pain.  Nigella would benefit from skilled physical therapy services to address the above impairments/limitations and improve overall functional capacity and QOL. .   OBJECTIVE IMPAIRMENTS: Abnormal gait, decreased activity tolerance, decreased endurance, decreased  mobility, difficulty walking, decreased ROM, and decreased strength.   ACTIVITY LIMITATIONS: lifting, bending, standing, and locomotion level  PARTICIPATION LIMITATIONS: community activity, occupation, and yard work  PERSONAL FACTORS: Age and Past/current experiences are also affecting patient's functional outcome.   REHAB POTENTIAL: Good  CLINICAL DECISION MAKING: Stable/uncomplicated  EVALUATION COMPLEXITY: Low   GOALS: Goals reviewed with patient? No  SHORT TERM GOALS: Target date: 04/19/2022 Pt will be independent with HEP in order to demonstrate participation in Physical Therapy POC.  Baseline: See HEP section Goal status: IN PROGRESS  2.  Pt will report < 5/10 for pain in Left ankle to demonstrate improved functional activity tolerance Baseline: 5/10 Goal status: IN PROGRESS   LONG TERM GOALS: Target date: 05/06/2022  Pt will improve LLE ankle MMT > 4/5 in L to improve functional capacity tolerance. Baseline: see objective. Goal status: IN PROGRESS  2.  Pt will increase > 345ft in 2MWT to demonstrate improve functional capacity and activity tolerance. Baseline: see objective Goal status: IN PROGRESS  3.  Pt will increased 5x STS to < 11 seconds to demonstrate increased functional LE strength.  Baseline: Assess next session.  Goal status: IN PROGRESS  4.  Pt will improve FOTO > 8 points to demonstrate improvements in overall functional output.  Baseline: see objective.  Goal status: IN PROGRESS    PLAN:  PT FREQUENCY: 2x/week  PT DURATION: 4 weeks  PLANNED INTERVENTIONS: Therapeutic exercises, Therapeutic activity, Neuromuscular re-education, Balance training, Gait training, Patient/Family education, Self Care, Joint mobilization, and Re-evaluation  PLAN FOR NEXT SESSION: Single leg balance once out of cam boot; intrinsic foot strengthening, calf stretching.    10:29 AM, 04/10/22 Jonathan Kirkendoll Small Deklan Minar MPT [place] [facility] [place]  [phone]

## 2022-04-12 ENCOUNTER — Ambulatory Visit (HOSPITAL_COMMUNITY): Payer: Medicare PPO

## 2022-04-12 DIAGNOSIS — M722 Plantar fascial fibromatosis: Secondary | ICD-10-CM

## 2022-04-12 DIAGNOSIS — M25672 Stiffness of left ankle, not elsewhere classified: Secondary | ICD-10-CM | POA: Diagnosis not present

## 2022-04-12 DIAGNOSIS — Z7409 Other reduced mobility: Secondary | ICD-10-CM | POA: Diagnosis not present

## 2022-04-12 DIAGNOSIS — M25671 Stiffness of right ankle, not elsewhere classified: Secondary | ICD-10-CM | POA: Diagnosis not present

## 2022-04-12 NOTE — Therapy (Signed)
OUTPATIENT PHYSICAL THERAPY LOWER EXTREMITY TREATMENT   Patient Name: Linda Page MRN: [id number] DOB:[birth year], 72 y.o., female Today's Date: 04/12/2022  END OF SESSION:  PT End of Session - 04/12/22 0913     Visit Number 3    Number of Visits 8    Date for PT Re-Evaluation 05/02/22    Authorization Type [employer]    Authorization Time Period 1 re-eval and 8 visits from 04/05/22 to 05/02/22    Authorization - Visit Number 3    Progress Note Due on Visit 8    PT Start Time 0900    PT Stop Time 0940    PT Time Calculation (min) 40 min    Activity Tolerance Patient tolerated treatment well    Behavior During Therapy [facility] for tasks assessed/performed            Past Medical History:  Diagnosis Date   Anxiety    Arthritis    in neck, in knee has gel shots givin in knee once a week for 3 weeks   BV (bacterial vaginosis) 04/06/2021   /329/23 rx flagyl   Cervical high risk HPV (human papillomavirus) test positive 04/06/2021   Repeat pap in 1 year per ASCCP guidelines 5 year risk for CIN3+ 4.8%   Depression    Hypertension    Past Surgical History:  Procedure Laterality Date   ANTERIOR CERVICAL DECOMP/DISCECTOMY FUSION N/A 04/29/2015   Procedure: ACDF C4-7;  Surgeon: Melina Schools, MD;  Location: [facility];  Service: Orthopedics;  Laterality: N/A;   BIOPSY  05/16/2021   Procedure: BIOPSY;  Surgeon: Eloise Harman, DO;  [facility]: [facility];  Service: Endoscopy;;  gastric erosion   cataract surgery     bilateral   COLONOSCOPY WITH PROPOFOL N/A 11/05/2018   Procedure: COLONOSCOPY WITH PROPOFOL;  Surgeon: Danie Binder, MD;  Location: [facility];  Service: Endoscopy;  Laterality: N/A;  10:30am   ESOPHAGOGASTRODUODENOSCOPY (EGD) WITH PROPOFOL N/A 05/16/2021   Procedure: ESOPHAGOGASTRODUODENOSCOPY (EGD) WITH PROPOFOL;  Surgeon: Eloise Harman, DO;  Location: [facility];  Service: Endoscopy;  Laterality: N/A;   ESOPHAGOGASTRODUODENOSCOPY (EGD) WITH PROPOFOL N/A  05/15/2021   Procedure: ESOPHAGOGASTRODUODENOSCOPY (EGD) WITH PROPOFOL;  Surgeon: Harvel Quale, MD;  Location: [facility];  Service: Gastroenterology;  Laterality: N/A;   HAND SURGERY     rt   KNEE ARTHROPLASTY Right 04/03/2019   Procedure: COMPUTER ASSISTED TOTAL KNEE ARTHROPLASTY;  Surgeon: Rod Can, MD;  Location: [facility];  Service: Orthopedics;  Laterality: Right;   KNEE SURGERY     rt   Patient Active Problem List   Diagnosis Date Noted   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   Upper abdominal pain    Gastric foreign body    BV (bacterial vaginosis) 04/06/2021   Cervical high risk HPV (human papillomavirus) test positive 04/06/2021   Encounter for gynecological examination with Papanicolaou smear of cervix 03/31/2021   Encounter for screening fecal occult blood testing 03/31/2021   Screening mammogram for breast cancer 03/31/2021   Osteoarthritis of right knee 04/03/2019   Preventative health care 08/12/2018   Neck pain 04/29/2015   Degeneration of cervical intervertebral disc 09/24/2013   Pain in joint, shoulder region 09/24/2013   Muscle weakness (generalized) 09/24/2013   Decreased range of motion of neck 09/24/2013    PCP: Celene Squibb MD  REFERRING PROVIDER: Lanae Crumbly DPM  REFERRING DIAG:  M72.2 (ICD-10-CM) - Plantar fasciitis  M76.72 (ICD-10-CM) - Peroneal tendinitis of left lower extremity  M62.462 (ICD-10-CM) - Gastrocnemius equinus of left lower extremity    THERAPY DIAG:  Plantar fasciitis of left foot  Decreased range of motion of both ankles  Impaired functional mobility, balance, gait, and endurance  Rationale for Evaluation and Treatment: Rehabilitation  ONSET DATE: 4.5 years ago  SUBJECTIVE:   SUBJECTIVE STATEMENT:  Patient denies any pain at the moment. However, patient states that her foot is still swollen and that she had a bad cramp last night. Went to her DPM last week and that she still needs to wear the boot when walking and  even at night.   EVAL:Foot started bothering er 4.22yrs ago and had a "hair fracture." MD put pt in boot. Pt reports "nail driving through foot" and was swelling throughout day. Potential hammer toe relation. Specialist wanted to pt in another boot but pt denied. March 1st of this year Left plantar fascial release. Pt has to wear boot at all times.   PERTINENT HISTORY: Osteoarthritis of right knee  Degeneration of cervical intervertebral disc  Pain in joint, shoulder region  Muscle weakness (generalized)  Decreased range of motion of neck  Neck pain  Preventative health care  More...  Encounter for gynecological examination with Papanicolaou smear of cervix  Encounter for screening fecal occult blood testing  Screening mammogram for breast cancer  BV (bacterial vaginosis)  Overview Signed 04/06/2021  2:53 PM by Estill Dooms, NP   /329/23 rx flagyl    Cervical high risk HPV (human papillomavirus) test positive  Overview Signed 04/06/2021  2:56 PM by Estill Dooms, NP   Repeat pap in 1 year per ASCCP guidelines 5 year risk for CIN3+ 4.8%    Upper abdominal pain  Gastric foreign body  Gastric erosion   PAIN:  Are you having pain? Yes: NPRS scale: 5/10 Pain location: Left foot and blister Pain description: "just hurts" Aggravating factors: lot of walking Relieving factors: Pain medications, very little ice  PRECAUTIONS: Other: WBAT in CAM boot  WEIGHT BEARING RESTRICTIONS: Yes WBAT  FALLS:  Has patient fallen in last 6 months? Yes. Number of falls 1, just caught up in rug.   LIVING ENVIRONMENT: Lives with: lives with their daughter Lives in: House/apartment Stairs: No Has following equipment at home: None  OCCUPATION: Retired  PLOF: Independent  PATIENT GOALS: "being able to wear tennis shoes back on"  NEXT MD VISIT: 04/06/2022  OBJECTIVE:   DIAGNOSTIC FINDINGS: IMPRESSION: 1. Moderate tendinosis of the peroneus longus and brevis tendons without  tear. 2. Plantar fasciitis. No tear. 3. 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  PATIENT SURVEYS:  FOTO 64.5%  COGNITION: Overall cognitive status: Within functional limits for tasks assessed     SENSATION: WFL    POSTURE: rounded shoulders and forward head  PALPATION: No TTP  LOWER EXTREMITY ROM:  Active ROM Right eval Left eval  Hip flexion    Hip extension    Hip abduction    Hip adduction    Hip internal rotation    Hip external rotation    Knee flexion    Knee extension    Ankle dorsiflexion KS: -9; -5 KS:5;15  Ankle plantarflexion    Ankle inversion 35 35  Ankle eversion 2 20   (Blank rows = not tested)  LOWER EXTREMITY MMT:  MMT Right eval Left eval  Hip flexion    Hip extension    Hip abduction    Hip adduction    Hip internal rotation    Hip external  rotation    Knee flexion    Knee extension 4+ 4-  Ankle dorsiflexion 4+ 4-  Ankle plantarflexion 3 3  Ankle inversion 4+ 4+  Ankle eversion 3+ 3+   (Blank rows = not tested)  LOWER EXTREMITY SPECIAL TESTS:    FUNCTIONAL TESTS:  5 times sit to stand: 13.6 seconds 2 minute walk test: 245ft 10 meter walk test: 9 seconds  GAIT: Distance walked: 234ft Assistive device utilized:  Cam boot Level of assistance: Complete Independence Comments: antalgic gait with LLE, mild bilateral trendelenberg, small LOB with ambulation, independent recovery.    TODAY'S TREATMENT:                                                                                                                              DATE:  04/12/2022 Seated Gastrocnemius stretch with a strap x 30" x 3 hamstring stretch x 30" x 3 ankle alphabets x 1 set Ankle 4-ways, RTB x 10 x 2 Plantar fascia mob with tennis ball x 2' Toe yoga x 20  Doming (arch lifts) x 20 Towel crunches x 2' Marble pick-ups x 2' Heel slides x 30  04/10/2022 Review of HEP and goals Seated:  toe crunches x 2' Marble pick up x 2' Heel slides x  10 for dorsiflexion 4 way red theraband x 10 each Tennis ball plantar fascia massage x 2' Calf/hamstring stretch 5 x 20" with strap   04/05/22 PT Evaluation, HEP    PATIENT EDUCATION:  Education details: updated HEP Person educated: Patient Education method: Customer service manager Education comprehension: verbalized understanding and returned demonstration  HOME EXERCISE PROGRAM: Access Code: [email or web address] URL: [email or web address]  04/12/2022 - Ankle Alphabet in Elevation  - 2 x daily - 7 x weekly - 1 sets - Seated Plantar Fascia Mobilization with Small Ball  - 1 x daily - 7 x weekly - Toe Yoga - Alternating Great Toe and Lesser Toe Extension  - 1 x daily - 7 x weekly - 20 reps - Seated Arch Lifts  - 1 x daily - 7 x weekly - 20 reps  Date: 04/05/2022 Prepared by: Alveda Reasons  Exercises - Ankle Eversion with Resistance  - 1 x daily - 7 x weekly - 3 sets - 10 reps - Ankle Inversion with Resistance  - 1 x daily - 7 x weekly - 3 sets - 10 reps - Ankle Dorsiflexion with Resistance  - 1 x daily - 7 x weekly - 3 sets - 10 reps - Towel Scrunches  - 1 x daily - 7 x weekly - 3 sets - 10 reps - Gastroc Stretch on Wall  - 1 x daily - 7 x weekly - 3 sets - 10 reps  ASSESSMENT:  CLINICAL IMPRESSION: Interventions today were geared towards LE flexibility and strengthening. Tolerated all activities without worsening of symptoms except with the ankle alphabets where patient reported of some discomfort on the dorsum of the ankle. Patient has also shown  some difficulty with marble pick ups and towel crunches due to weakness. Demonstrated appropriate levels of fatigue. Provided slight amount of cueing to ensure correct execution of activity with good carry-over. To date, skilled PT is required to address the impairments and improve function.  Pt is a pleasant 72 year old female who is presenting to physical therapy today for L ankle pain and plantar fasciitis. Linda Page is  referred to physical therapy by referred for Peroneal tendinitis of LLE and Gastrocnemisu equinius of LLE. Pt's past medical history includes: R TKA. Pt currently is in CAM boot WBAT had lengthening of L gastrocnemius musculature on 03/16/22. No ROM restrictions present upon chart review.   Based upon today's evaluation, pt is demonstrating reduced functional mobility capcaity, mild balance deficits due to  ROM deficits, muscle weakness, and pain. Linda Page would benefit from skilled physical therapy services to address the above impairments/limitations and improve overall functional capacity and QOL. .   OBJECTIVE IMPAIRMENTS: Abnormal gait, decreased activity tolerance, decreased endurance, decreased mobility, difficulty walking, decreased ROM, and decreased strength.   ACTIVITY LIMITATIONS: lifting, bending, standing, and locomotion level  PARTICIPATION LIMITATIONS: community activity, occupation, and yard work  PERSONAL FACTORS: Age and Past/current experiences are also affecting patient's functional outcome.   REHAB POTENTIAL: Good  CLINICAL DECISION MAKING: Stable/uncomplicated  EVALUATION COMPLEXITY: Low   GOALS: Goals reviewed with patient? No  SHORT TERM GOALS: Target date: 04/19/2022 Pt will be independent with HEP in order to demonstrate participation in Physical Therapy POC.  Baseline: See HEP section Goal status: IN PROGRESS  2.  Pt will report < 5/10 for pain in Left ankle to demonstrate improved functional activity tolerance Baseline: 5/10 Goal status: IN PROGRESS   LONG TERM GOALS: Target date: 05/06/2022  Pt will improve LLE ankle MMT > 4/5 in L to improve functional capacity tolerance. Baseline: see objective. Goal status: IN PROGRESS  2.  Pt will increase > 354ft in 2MWT to demonstrate improve functional capacity and activity tolerance. Baseline: see objective Goal status: IN PROGRESS  3.  Pt will increased 5x STS to < 11 seconds to demonstrate increased  functional LE strength.  Baseline: Assess next session.  Goal status: IN PROGRESS  4.  Pt will improve FOTO > 8 points to demonstrate improvements in overall functional output.  Baseline: see objective.  Goal status: IN PROGRESS    PLAN:  PT FREQUENCY: 2x/week  PT DURATION: 4 weeks  PLANNED INTERVENTIONS: Therapeutic exercises, Therapeutic activity, Neuromuscular re-education, Balance training, Gait training, Patient/Family education, Self Care, Joint mobilization, and Re-evaluation  PLAN FOR NEXT SESSION: Single leg balance once out of cam boot; intrinsic foot strengthening, calf stretching.    9:17 AM, 04/12/22 Harvie Heck. Holliday Sheaffer, PT, DPT, OCS Board-Certified Clinical Specialist in [place] # ([place]): [phone]

## 2022-04-14 DIAGNOSIS — I10 Essential (primary) hypertension: Secondary | ICD-10-CM | POA: Diagnosis not present

## 2022-04-14 DIAGNOSIS — E782 Mixed hyperlipidemia: Secondary | ICD-10-CM | POA: Diagnosis not present

## 2022-04-14 DIAGNOSIS — R7303 Prediabetes: Secondary | ICD-10-CM | POA: Diagnosis not present

## 2022-04-14 DIAGNOSIS — E569 Vitamin deficiency, unspecified: Secondary | ICD-10-CM | POA: Diagnosis not present

## 2022-04-18 ENCOUNTER — Ambulatory Visit (HOSPITAL_COMMUNITY): Payer: Medicare PPO

## 2022-04-18 DIAGNOSIS — M722 Plantar fascial fibromatosis: Secondary | ICD-10-CM | POA: Diagnosis not present

## 2022-04-18 DIAGNOSIS — M542 Cervicalgia: Secondary | ICD-10-CM | POA: Diagnosis not present

## 2022-04-18 DIAGNOSIS — M25671 Stiffness of right ankle, not elsewhere classified: Secondary | ICD-10-CM | POA: Diagnosis not present

## 2022-04-18 DIAGNOSIS — Z7409 Other reduced mobility: Secondary | ICD-10-CM | POA: Diagnosis not present

## 2022-04-18 DIAGNOSIS — Z0001 Encounter for general adult medical examination with abnormal findings: Secondary | ICD-10-CM | POA: Diagnosis not present

## 2022-04-18 DIAGNOSIS — N189 Chronic kidney disease, unspecified: Secondary | ICD-10-CM | POA: Diagnosis not present

## 2022-04-18 DIAGNOSIS — I129 Hypertensive chronic kidney disease with stage 1 through stage 4 chronic kidney disease, or unspecified chronic kidney disease: Secondary | ICD-10-CM | POA: Diagnosis not present

## 2022-04-18 DIAGNOSIS — I10 Essential (primary) hypertension: Secondary | ICD-10-CM | POA: Diagnosis not present

## 2022-04-18 DIAGNOSIS — E782 Mixed hyperlipidemia: Secondary | ICD-10-CM | POA: Diagnosis not present

## 2022-04-18 DIAGNOSIS — L282 Other prurigo: Secondary | ICD-10-CM | POA: Diagnosis not present

## 2022-04-18 DIAGNOSIS — R7303 Prediabetes: Secondary | ICD-10-CM | POA: Diagnosis not present

## 2022-04-18 DIAGNOSIS — L299 Pruritus, unspecified: Secondary | ICD-10-CM | POA: Diagnosis not present

## 2022-04-18 DIAGNOSIS — M25672 Stiffness of left ankle, not elsewhere classified: Secondary | ICD-10-CM | POA: Diagnosis not present

## 2022-04-18 NOTE — Therapy (Signed)
OUTPATIENT PHYSICAL THERAPY LOWER EXTREMITY TREATMENT   Patient Name: Linda SheldonDebra C Kilburg MRN: [id number] DOB:March 18, 1950, 72 y.o., female Today's Date: 04/18/2022  END OF SESSION:   PT End of Session - 04/18/22 0952     Visit Number 4    Number of Visits 8    Date for PT Re-Evaluation 05/02/22    Authorization Type [employer]    Authorization Time Period 1 re-eval and 8 visits from 04/05/22 to 05/02/22    Authorization - Visit Number 4    Authorization - Number of Visits 8    Progress Note Due on Visit 8    PT Start Time 0900    PT Stop Time 0940    PT Time Calculation (min) 40 min    Activity Tolerance Patient tolerated treatment well    [facility] tasks assessed/performed             Past Medical History:  Diagnosis Date   Anxiety    Arthritis    in neck, in knee has gel shots givin in knee once a week for 3 weeks   BV (bacterial vaginosis) 04/06/2021   /329/23 rx flagyl   Cervical high risk HPV (human papillomavirus) test positive 04/06/2021   Repeat pap in 1 year per ASCCP guidelines 5 year risk for CIN3+ 4.8%   Depression    Hypertension    Past Surgical History:  Procedure Laterality Date   ANTERIOR CERVICAL DECOMP/DISCECTOMY FUSION N/A 04/29/2015   Procedure: ACDF C4-7;  Surgeon: Venita Lickahari Brooks, MD;  Location: [facility] OR;  Service: Orthopedics;  Laterality: N/A;   BIOPSY  05/16/2021   Procedure: BIOPSY;  Surgeon: Lanelle Balarver, Charles K, DO;  Location: [facility];  Service: Endoscopy;;  gastric erosion   cataract surgery     bilateral   COLONOSCOPY WITH PROPOFOL N/A 11/05/2018   Procedure: COLONOSCOPY WITH PROPOFOL;  Surgeon: West BaliFields, Sandi L, MD;  Location: [facility];  Service: Endoscopy;  Laterality: N/A;  10:30am   ESOPHAGOGASTRODUODENOSCOPY (EGD) WITH PROPOFOL N/A 05/16/2021   Procedure: ESOPHAGOGASTRODUODENOSCOPY (EGD) WITH PROPOFOL;  Surgeon: Lanelle Balarver, Charles K, DO;  Location: [facility];  Service: Endoscopy;  Laterality: N/A;    ESOPHAGOGASTRODUODENOSCOPY (EGD) WITH PROPOFOL N/A 05/15/2021   Procedure: ESOPHAGOGASTRODUODENOSCOPY (EGD) WITH PROPOFOL;  Surgeon: Dolores Frameastaneda Mayorga, Daniel, MD;  Location: [facility];  Service: Gastroenterology;  Laterality: N/A;   HAND SURGERY     rt   KNEE ARTHROPLASTY Right 04/03/2019   Procedure: COMPUTER ASSISTED TOTAL KNEE ARTHROPLASTY;  Surgeon: Samson FredericSwinteck, Brian, MD;  Location: [facility];  Service: Orthopedics;  Laterality: Right;   KNEE SURGERY     rt   Patient Active Problem List   Diagnosis Date Noted   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   Upper abdominal pain    Gastric foreign body    BV (bacterial vaginosis) 04/06/2021   Cervical high risk HPV (human papillomavirus) test positive 04/06/2021   Encounter for gynecological examination with Papanicolaou smear of cervix 03/31/2021   Encounter for screening fecal occult blood testing 03/31/2021   Screening mammogram for breast cancer 03/31/2021   Osteoarthritis of right knee 04/03/2019   Preventative health care 08/12/2018   Neck pain 04/29/2015   Degeneration of cervical intervertebral disc 09/24/2013   Pain in joint, shoulder region 09/24/2013   Muscle weakness (generalized) 09/24/2013   Decreased range of motion of neck 09/24/2013    PCP: Benita StabileJohn Z Hall MD  REFERRING PROVIDER: Sharl MaAdam McDonald DPM  REFERRING DIAG:  M72.2 (ICD-10-CM) - Plantar fasciitis  M76.72 (ICD-10-CM) - Peroneal tendinitis of left lower extremity  M62.462 (ICD-10-CM) - Gastrocnemius equinus of left lower extremity    THERAPY DIAG:  Plantar fasciitis of left foot  Decreased range of motion of both ankles  Impaired functional mobility, balance, gait, and endurance  Rationale for Evaluation and Treatment: Rehabilitation  ONSET DATE: 4.5 years ago  SUBJECTIVE:   SUBJECTIVE STATEMENT:  Patient states that she's sore. Claims that she took off the boot last night because she was not comfortable. States that she was alright after the last time she did the  therapy. Been doing her HEP without any issues.   EVAL:Foot started bothering er 4.37yrs ago and had a "hair fracture." MD put pt in boot. Pt reports "nail driving through foot" and was swelling throughout day. Potential hammer toe relation. Specialist wanted to pt in another boot but pt denied. March 1st of this year Left plantar fascial release. Pt has to wear boot at all times.   PERTINENT HISTORY: Osteoarthritis of right knee  Degeneration of cervical intervertebral disc  Pain in joint, shoulder region  Muscle weakness (generalized)  Decreased range of motion of neck  Neck pain  Preventative health care  More...  Encounter for gynecological examination with Papanicolaou smear of cervix  Encounter for screening fecal occult blood testing  Screening mammogram for breast cancer  BV (bacterial vaginosis)  Overview Signed 04/06/2021  2:53 PM by Adline Potter, NP   /329/23 rx flagyl    Cervical high risk HPV (human papillomavirus) test positive  Overview Signed 04/06/2021  2:56 PM by Adline Potter, NP   Repeat pap in 1 year per ASCCP guidelines 5 year risk for CIN3+ 4.8%    Upper abdominal pain  Gastric foreign body  Gastric erosion   PAIN:  Are you having pain? Yes: NPRS scale: 5/10 Pain location: Left foot and blister Pain description: "just hurts" Aggravating factors: lot of walking Relieving factors: Pain medications, very little ice  PRECAUTIONS: Other: WBAT in CAM boot  WEIGHT BEARING RESTRICTIONS: Yes WBAT  FALLS:  Has patient fallen in last 6 months? Yes. Number of falls 1, just caught up in rug.   LIVING ENVIRONMENT: Lives with: lives with their daughter Lives in: House/apartment Stairs: No Has following equipment at home: None  OCCUPATION: Retired  PLOF: Independent  PATIENT GOALS: "being able to wear tennis shoes back on"  NEXT MD VISIT: 04/06/2022  OBJECTIVE:   DIAGNOSTIC FINDINGS: IMPRESSION: 1. Moderate tendinosis of the peroneus longus  and brevis tendons without tear. 2. Plantar fasciitis. No tear. 3. 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  PATIENT SURVEYS:  FOTO 64.5%  COGNITION: Overall cognitive status: Within functional limits for tasks assessed     SENSATION: WFL    POSTURE: rounded shoulders and forward head  PALPATION: No TTP  LOWER EXTREMITY ROM:  Active ROM Right eval Left eval  Hip flexion    Hip extension    Hip abduction    Hip adduction    Hip internal rotation    Hip external rotation    Knee flexion    Knee extension    Ankle dorsiflexion KS: -9; -5 KS:5;15  Ankle plantarflexion    Ankle inversion 35 35  Ankle eversion 2 20   (Blank rows = not tested)  LOWER EXTREMITY MMT:  MMT Right eval Left eval  Hip flexion    Hip extension    Hip abduction    Hip adduction    Hip internal rotation  Hip external rotation    Knee flexion    Knee extension 4+ 4-  Ankle dorsiflexion 4+ 4-  Ankle plantarflexion 3 3  Ankle inversion 4+ 4+  Ankle eversion 3+ 3+   (Blank rows = not tested)  LOWER EXTREMITY SPECIAL TESTS:    FUNCTIONAL TESTS:  5 times sit to stand: 13.6 seconds 2 minute walk test: 265ft 10 meter walk test: 9 seconds  GAIT: Distance walked: 236ft Assistive device utilized:  Cam boot Level of assistance: Complete Independence Comments: antalgic gait with LLE, mild bilateral trendelenberg, small LOB with ambulation, independent recovery.    TODAY'S TREATMENT:                                                                                                                              DATE:  04/18/22 Seated Gastrocnemius stretch with a strap x 30" x 3 hamstring stretch x 30" x 3 ankle alphabets x 1 set Ankle 4-ways, RTB x 10 x 2 Plantar fascia mob with tennis ball x 2' Toe yoga x 20  Doming (arch lifts) x 20 Towel crunches x 2' Marble pick-ups x 2' Heel slides x 30  04/12/2022 Seated Gastrocnemius stretch with a strap x 30" x 3 hamstring  stretch x 30" x 3 ankle alphabets x 1 set Ankle 4-ways, RTB x 10 x 2 Plantar fascia mob with tennis ball x 2' Toe yoga x 20  Doming (arch lifts) x 20 Towel crunches x 2' Marble pick-ups x 2' Heel slides x 30  04/10/2022 Review of HEP and goals Seated:  toe crunches x 2' Marble pick up x 2' Heel slides x 10 for dorsiflexion 4 way red theraband x 10 each Tennis ball plantar fascia massage x 2' Calf/hamstring stretch 5 x 20" with strap   04/05/22 PT Evaluation, HEP    PATIENT EDUCATION:  Education details: updated HEP Person educated: Patient Education method: Medical illustrator Education comprehension: verbalized understanding and returned demonstration  HOME EXERCISE PROGRAM: Access Code: [email or web address] URL: [email or web address]  04/12/2022 - Ankle Alphabet in Elevation  - 2 x daily - 7 x weekly - 1 sets - Seated Plantar Fascia Mobilization with Small Ball  - 1 x daily - 7 x weekly - Toe Yoga - Alternating Great Toe and Lesser Toe Extension  - 1 x daily - 7 x weekly - 20 reps - Seated Arch Lifts  - 1 x daily - 7 x weekly - 20 reps  Date: 04/05/2022 Prepared by: Starling Manns  Exercises - Ankle Eversion with Resistance  - 1 x daily - 7 x weekly - 3 sets - 10 reps - Ankle Inversion with Resistance  - 1 x daily - 7 x weekly - 3 sets - 10 reps - Ankle Dorsiflexion with Resistance  - 1 x daily - 7 x weekly - 3 sets - 10 reps - Towel Scrunches  - 1 x daily - 7 x weekly - 3 sets - 10 reps -  Gastroc Stretch on Wall  - 1 x daily - 7 x weekly - 3 sets - 10 reps  ASSESSMENT:  CLINICAL IMPRESSION: Interventions today were geared towards LE flexibility and strengthening. Tolerated all activities without worsening of symptoms. Patient still has some difficulty with marble pick ups and towel crunches due to weakness. Demonstrated appropriate levels of fatigue. Provided slight amount of cueing to ensure correct execution of activity with good carry-over. To date,  skilled PT is required to address the impairments and improve function.  Pt is a pleasant 72 year old female who is presenting to physical therapy today for L ankle pain and plantar fasciitis. Jady is referred to physical therapy by referred for Peroneal tendinitis of LLE and Gastrocnemisu equinius of LLE. Pt's past medical history includes: R TKA. Pt currently is in CAM boot WBAT had lengthening of L gastrocnemius musculature on 03/16/22. No ROM restrictions present upon chart review.   Based upon today's evaluation, pt is demonstrating reduced functional mobility capcaity, mild balance deficits due to  ROM deficits, muscle weakness, and pain. Mishti would benefit from skilled physical therapy services to address the above impairments/limitations and improve overall functional capacity and QOL. .   OBJECTIVE IMPAIRMENTS: Abnormal gait, decreased activity tolerance, decreased endurance, decreased mobility, difficulty walking, decreased ROM, and decreased strength.   ACTIVITY LIMITATIONS: lifting, bending, standing, and locomotion level  PARTICIPATION LIMITATIONS: community activity, occupation, and yard work  PERSONAL FACTORS: Age and Past/current experiences are also affecting patient's functional outcome.   REHAB POTENTIAL: Good  CLINICAL DECISION MAKING: Stable/uncomplicated  EVALUATION COMPLEXITY: Low   GOALS: Goals reviewed with patient? No  SHORT TERM GOALS: Target date: 04/19/2022 Pt will be independent with HEP in order to demonstrate participation in Physical Therapy POC.  Baseline: See HEP section Goal status: IN PROGRESS  2.  Pt will report < 5/10 for pain in Left ankle to demonstrate improved functional activity tolerance Baseline: 5/10 Goal status: IN PROGRESS   LONG TERM GOALS: Target date: 05/06/2022  Pt will improve LLE ankle MMT > 4/5 in L to improve functional capacity tolerance. Baseline: see objective. Goal status: IN PROGRESS  2.  Pt will increase > 334ft in  to demonstrate improve functional capacity and activity tolerance. Baseline: see objective Goal status: IN PROGRESS  3.  Pt will increased 5x STS to < 11 seconds to demonstrate increased functional LE strength.  Baseline: Assess next session.  Goal status: IN PROGRESS  4.  Pt will improve FOTO > 8 points to demonstrate improvements in overall functional output.  Baseline: see objective.  Goal status: IN PROGRESS    PLAN:  PT FREQUENCY: 2x/week  PT DURATION: 4 weeks  PLANNED INTERVENTIONS: Therapeutic exercises, Therapeutic activity, Neuromuscular re-education, Balance training, Gait training, Patient/Family education, Self Care, Joint mobilization, and Re-evaluation  PLAN FOR NEXT SESSION: Single leg balance once out of cam boot; intrinsic foot strengthening, calf stretching.    9:53 AM, 04/18/22 Tish Frederickson. Jocob Dambach, PT, DPT, OCS Board-Certified Clinical Specialist in Orthopedic PT PT Compact Privilege # ([place]): [phone]

## 2022-04-20 ENCOUNTER — Ambulatory Visit (INDEPENDENT_AMBULATORY_CARE_PROVIDER_SITE_OTHER): Payer: Medicare PPO | Admitting: Podiatry

## 2022-04-20 ENCOUNTER — Encounter: Payer: Medicare PPO | Admitting: Podiatry

## 2022-04-20 DIAGNOSIS — M722 Plantar fascial fibromatosis: Secondary | ICD-10-CM

## 2022-04-20 DIAGNOSIS — M7672 Peroneal tendinitis, left leg: Secondary | ICD-10-CM

## 2022-04-20 DIAGNOSIS — M62462 Contracture of muscle, left lower leg: Secondary | ICD-10-CM

## 2022-04-21 ENCOUNTER — Ambulatory Visit (HOSPITAL_COMMUNITY): Payer: Medicare PPO

## 2022-04-21 ENCOUNTER — Encounter (HOSPITAL_COMMUNITY): Payer: Self-pay

## 2022-04-21 DIAGNOSIS — Z7409 Other reduced mobility: Secondary | ICD-10-CM | POA: Diagnosis not present

## 2022-04-21 DIAGNOSIS — M25672 Stiffness of left ankle, not elsewhere classified: Secondary | ICD-10-CM | POA: Diagnosis not present

## 2022-04-21 DIAGNOSIS — M722 Plantar fascial fibromatosis: Secondary | ICD-10-CM | POA: Diagnosis not present

## 2022-04-21 DIAGNOSIS — M25671 Stiffness of right ankle, not elsewhere classified: Secondary | ICD-10-CM | POA: Diagnosis not present

## 2022-04-21 NOTE — Therapy (Signed)
OUTPATIENT PHYSICAL THERAPY LOWER EXTREMITY TREATMENT   Patient Name: Linda Page MRN: [id number] DOB:1950-11-19, 72 y.o., female Today's Date: 04/21/2022  END OF SESSION:   PT End of Session - 04/21/22 1044     Visit Number 5    Number of Visits 8    Date for PT Re-Evaluation 05/02/22    Authorization Type [employer]    Authorization Time Period 1 re-eval and 8 visits from 04/05/22 to 05/02/22    Authorization - Visit Number 5    Authorization - Number of Visits 8    Progress Note Due on Visit 8    PT Start Time 1002    PT Stop Time 1041    PT Time Calculation (min) 39 min    Activity Tolerance Patient tolerated treatment well    Behavior During Therapy [facility] [place] for tasks assessed/performed              Past Medical History:  Diagnosis Date   Anxiety    Arthritis    in neck, in knee has gel shots givin in knee once a week for 3 weeks   BV (bacterial vaginosis) 04/06/2021   /329/23 rx flagyl   Cervical high risk HPV (human papillomavirus) test positive 04/06/2021   Repeat pap in 1 year per ASCCP guidelines 5 year risk for CIN3+ 4.8%   Depression    Hypertension    Past Surgical History:  Procedure Laterality Date   ANTERIOR CERVICAL DECOMP/DISCECTOMY FUSION N/A 04/29/2015   Procedure: ACDF C4-7;  Surgeon: Venita Lick, MD;  Location: [facility] OR;  Service: Orthopedics;  Laterality: N/A;   BIOPSY  05/16/2021   Procedure: BIOPSY;  Surgeon: Lanelle Bal, DO;  Location: [facility];  Service: Endoscopy;;  gastric erosion   cataract surgery     bilateral   COLONOSCOPY WITH PROPOFOL N/A 11/05/2018   Procedure: COLONOSCOPY WITH PROPOFOL;  Surgeon: West Bali, MD;  Location: [facility];  Service: Endoscopy;  Laterality: N/A;  10:30am   ESOPHAGOGASTRODUODENOSCOPY (EGD) WITH PROPOFOL N/A 05/16/2021   Procedure: ESOPHAGOGASTRODUODENOSCOPY (EGD) WITH PROPOFOL;  Surgeon: Lanelle Bal, DO;  Location: [facility];  Service: Endoscopy;  Laterality: N/A;    ESOPHAGOGASTRODUODENOSCOPY (EGD) WITH PROPOFOL N/A 05/15/2021   Procedure: ESOPHAGOGASTRODUODENOSCOPY (EGD) WITH PROPOFOL;  Surgeon: Dolores Frame, MD;  Location: [facility];  Service: Gastroenterology;  Laterality: N/A;   HAND SURGERY     rt   KNEE ARTHROPLASTY Right 04/03/2019   Procedure: COMPUTER ASSISTED TOTAL KNEE ARTHROPLASTY;  Surgeon: Samson Frederic, MD;  Location: [facility];  Service: Orthopedics;  Laterality: Right;   KNEE SURGERY     rt   Patient Active Problem List   Diagnosis Date Noted   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   Upper abdominal pain    Gastric foreign body    BV (bacterial vaginosis) 04/06/2021   Cervical high risk HPV (human papillomavirus) test positive 04/06/2021   Encounter for gynecological examination with Papanicolaou smear of cervix 03/31/2021   Encounter for screening fecal occult blood testing 03/31/2021   Screening mammogram for breast cancer 03/31/2021   Osteoarthritis of right knee 04/03/2019   Preventative health care 08/12/2018   Neck pain 04/29/2015   Degeneration of cervical intervertebral disc 09/24/2013   Pain in joint, shoulder region 09/24/2013   Muscle weakness (generalized) 09/24/2013   Decreased range of motion of neck 09/24/2013    PCP: Benita Stabile MD  REFERRING PROVIDER: Sharl Ma DPM  REFERRING DIAG:  M72.2 (ICD-10-CM) - Plantar  fasciitis  M76.72 (ICD-10-CM) - Peroneal tendinitis of left lower extremity  M62.462 (ICD-10-CM) - Gastrocnemius equinus of left lower extremity    THERAPY DIAG:  Plantar fasciitis of left foot  Decreased range of motion of both ankles  Impaired functional mobility, balance, gait, and endurance  Rationale for Evaluation and Treatment: Rehabilitation  ONSET DATE: 4.5 years ago  SUBJECTIVE:   SUBJECTIVE STATEMENT: MD apt yesterday, arrived without CAM boot on.  She returns to MD apt in 6 weeks.  No reports of pain, some soreness in heel.  Been compliant with HEP regularly.  Arrived  wearing compression socks for edema control.   EVAL:Foot started bothering er 4.52yrs ago and had a "hair fracture." MD put pt in boot. Pt reports "nail driving through foot" and was swelling throughout day. Potential hammer toe relation. Specialist wanted to pt in another boot but pt denied. March 1st of this year Left plantar fascial release. Pt has to wear boot at all times.   PERTINENT HISTORY: Osteoarthritis of right knee  Degeneration of cervical intervertebral disc  Pain in joint, shoulder region  Muscle weakness (generalized)  Decreased range of motion of neck  Neck pain  Preventative health care  More...  Encounter for gynecological examination with Papanicolaou smear of cervix  Encounter for screening fecal occult blood testing  Screening mammogram for breast cancer  BV (bacterial vaginosis)  Overview Signed 04/06/2021  2:53 PM by Adline Potter, NP   /329/23 rx flagyl    Cervical high risk HPV (human papillomavirus) test positive  Overview Signed 04/06/2021  2:56 PM by Adline Potter, NP   Repeat pap in 1 year per ASCCP guidelines 5 year risk for CIN3+ 4.8%    Upper abdominal pain  Gastric foreign body  Gastric erosion   PAIN:  Are you having pain? Yes: NPRS scale: 5/10 Pain location: Left foot and blister Pain description: "just hurts" Aggravating factors: lot of walking Relieving factors: Pain medications, very little ice  PRECAUTIONS: Other: WBAT in CAM boot  WEIGHT BEARING RESTRICTIONS: Yes WBAT  FALLS:  Has patient fallen in last 6 months? Yes. Number of falls 1, just caught up in rug.   LIVING ENVIRONMENT: Lives with: lives with their daughter Lives in: House/apartment Stairs: No Has following equipment at home: None  OCCUPATION: Retired  PLOF: Independent  PATIENT GOALS: "being able to wear tennis shoes back on"  NEXT MD VISIT: 04/06/2022  OBJECTIVE:   DIAGNOSTIC FINDINGS: IMPRESSION: 1. Moderate tendinosis of the peroneus longus and  brevis tendons without tear. 2. Plantar fasciitis. No tear. 3. 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  PATIENT SURVEYS:  FOTO 64.5%  COGNITION: Overall cognitive status: Within functional limits for tasks assessed     SENSATION: WFL    POSTURE: rounded shoulders and forward head  PALPATION: No TTP  LOWER EXTREMITY ROM:  Active ROM Right eval Left eval  Hip flexion    Hip extension    Hip abduction    Hip adduction    Hip internal rotation    Hip external rotation    Knee flexion    Knee extension    Ankle dorsiflexion KS: -9; -5 KS:5;15  Ankle plantarflexion    Ankle inversion 35 35  Ankle eversion 2 20   (Blank rows = not tested)  LOWER EXTREMITY MMT:  MMT Right eval Left eval  Hip flexion    Hip extension    Hip abduction    Hip adduction    Hip internal rotation  Hip external rotation    Knee flexion    Knee extension 4+ 4-  Ankle dorsiflexion 4+ 4-  Ankle plantarflexion 3 3  Ankle inversion 4+ 4+  Ankle eversion 3+ 3+   (Blank rows = not tested)  LOWER EXTREMITY SPECIAL TESTS:    FUNCTIONAL TESTS:  5 times sit to stand: 13.6 seconds 2 minute walk test: 270ft 10 meter walk test: 9 seconds  GAIT: Distance walked: 29ft Assistive device utilized:  Cam boot Level of assistance: Complete Independence Comments: antalgic gait with LLE, mild bilateral trendelenberg, small LOB with ambulation, independent recovery.    TODAY'S TREATMENT:                                                                                                                              DATE:  04/21/22: in normal shoes to improve mechanics 282ft Seated BAPS L2 PF/DF; Inv/Ev, CW/CCW  Discussed weaning out of boot for pain control Heel raises seated then standing 10x 2 sets Toe raises 10x (limited Rt foot DF)seated then standing 10x 2 sets Rockerboard 2 min Inv/Ev; DF/PF  04/18/22 Seated Gastrocnemius stretch with a strap x 30" x 3 hamstring  stretch x 30" x 3 ankle alphabets x 1 set Ankle 4-ways, RTB x 10 x 2 Plantar fascia mob with tennis ball x 2' Toe yoga x 20  Doming (arch lifts) x 20 Towel crunches x 2' Marble pick-ups x 2' Heel slides x 30  04/12/2022 Seated Gastrocnemius stretch with a strap x 30" x 3 hamstring stretch x 30" x 3 ankle alphabets x 1 set Ankle 4-ways, RTB x 10 x 2 Plantar fascia mob with tennis ball x 2' Toe yoga x 20  Doming (arch lifts) x 20 Towel crunches x 2' Marble pick-ups x 2' Heel slides x 30  04/10/2022 Review of HEP and goals Seated:  toe crunches x 2' Marble pick up x 2' Heel slides x 10 for dorsiflexion 4 way red theraband x 10 each Tennis ball plantar fascia massage x 2' Calf/hamstring stretch 5 x 20" with strap   04/05/22 PT Evaluation, HEP    PATIENT EDUCATION:  Education details: updated HEP Person educated: Patient Education method: Medical illustrator Education comprehension: verbalized understanding and returned demonstration  HOME EXERCISE PROGRAM: Access Code: [email or web address] URL: [email or web address] 04/21/22 -Standing heel and toe raises  04/12/2022 - Ankle Alphabet in Elevation  - 2 x daily - 7 x weekly - 1 sets - Seated Plantar Fascia Mobilization with Small Ball  - 1 x daily - 7 x weekly - Toe Yoga - Alternating Great Toe and Lesser Toe Extension  - 1 x daily - 7 x weekly - 20 reps - Seated Arch Lifts  - 1 x daily - 7 x weekly - 20 reps  Date: 04/05/2022 Prepared by: Linda Page  Exercises - Ankle Eversion with Resistance  - 1 x daily - 7 x weekly - 3 sets - 10 reps - Ankle  Inversion with Resistance  - 1 x daily - 7 x weekly - 3 sets - 10 reps - Ankle Dorsiflexion with Resistance  - 1 x daily - 7 x weekly - 3 sets - 10 reps - Towel Scrunches  - 1 x daily - 7 x weekly - 3 sets - 10 reps - Gastroc Stretch on Wall  - 1 x daily - 7 x weekly - 3 sets - 10 reps  ASSESSMENT:  CLINICAL IMPRESSION: Pt arrived out of CAM boot.   Session focus on improving gait mechanics, LE strengthening and ankle mobility.  Gait training to improve heel to toe mechanics and equalize stance phase.  Noted decreased DF Rt foot during gait and seated exercises.  Pt reports leg length discrepancy, encouraged to bring heel lift with her next session to assist with hip pain and improve gait mechanics.  Added BAPS for ankle mobility as well as rockerboard.  Pt tolerated well towards session with no reports of pain.  Added gait mechanics and standing heel/toe raises for strengthening to HEP.    Eval:  Pt is a pleasant 72 year old female who is presenting to physical therapy today for L ankle pain and plantar fasciitis. Linda Page is referred to physical therapy by referred for Peroneal tendinitis of LLE and Gastrocnemisu equinius of LLE. Pt's past medical history includes: R TKA. Pt currently is in CAM boot WBAT had lengthening of L gastrocnemius musculature on 03/16/22. No ROM restrictions present upon chart review.   Based upon today's evaluation, pt is demonstrating reduced functional mobility capcaity, mild balance deficits due to  ROM deficits, muscle weakness, and pain. Linda Page would benefit from skilled physical therapy services to address the above impairments/limitations and improve overall functional capacity and QOL. .   OBJECTIVE IMPAIRMENTS: Abnormal gait, decreased activity tolerance, decreased endurance, decreased mobility, difficulty walking, decreased ROM, and decreased strength.   ACTIVITY LIMITATIONS: lifting, bending, standing, and locomotion level  PARTICIPATION LIMITATIONS: community activity, occupation, and yard work  PERSONAL FACTORS: Age and Past/current experiences are also affecting patient's functional outcome.   REHAB POTENTIAL: Good  CLINICAL DECISION MAKING: Stable/uncomplicated  EVALUATION COMPLEXITY: Low   GOALS: Goals reviewed with patient? No  SHORT TERM GOALS: Target date: 04/19/2022 Pt will be independent with  HEP in order to demonstrate participation in Physical Therapy POC.  Baseline: See HEP section Goal status: IN PROGRESS  2.  Pt will report < 5/10 for pain in Left ankle to demonstrate improved functional activity tolerance Baseline: 5/10 Goal status: IN PROGRESS   LONG TERM GOALS: Target date: 05/06/2022  Pt will improve LLE ankle MMT > 4/5 in L to improve functional capacity tolerance. Baseline: see objective. Goal status: IN PROGRESS  2.  Pt will increase > 357ft in to demonstrate improve functional capacity and activity tolerance. Baseline: see objective Goal status: IN PROGRESS  3.  Pt will increased 5x STS to < 11 seconds to demonstrate increased functional LE strength.  Baseline: Assess next session.  Goal status: IN PROGRESS  4.  Pt will improve FOTO > 8 points to demonstrate improvements in overall functional output.  Baseline: see objective.  Goal status: IN PROGRESS    PLAN:  PT FREQUENCY: 2x/week  PT DURATION: 4 weeks  PLANNED INTERVENTIONS: Therapeutic exercises, Therapeutic activity, Neuromuscular re-education, Balance training, Gait training, Patient/Family education, Self Care, Joint mobilization, and Re-evaluation  PLAN FOR NEXT SESSION: Single leg balance once out of cam boot; intrinsic foot strengthening, calf stretching.   Becky Sax, LPTA/CLT; [facility]  [phone]  12:53 PM, 04/21/22

## 2022-04-23 NOTE — Progress Notes (Signed)
  Subjective:  Patient ID: Linda Page, female    DOB: Nov 27, 1950,  MRN: [id number]  Chief Complaint  Patient presents with   Routine Post Op    POV #3 DOS 03/10/2022 LT EPF, CALF MUSCLE LENGTHENING, PRP INJECTION     72 y.o. female returns for post-op check.  She is doing very well not having any pain Review of Systems: Negative except as noted in the HPI. Denies N/V/F/Ch.   Objective:  There were no vitals filed for this visit. There is no height or weight on file to calculate BMI. Constitutional Well developed. Well nourished.  Vascular Foot warm and well perfused. Capillary refill normal to all digits.  Calf is soft and supple, no posterior calf or knee pain, negative Homans' sign  Neurologic Normal speech. Oriented to person, place, and time. Epicritic sensation to light touch grossly present bilaterally.  Dermatologic Incision well-healed not hypertrophic  Orthopedic: No pain to palpation today.  Good range of motion    Assessment:   1. Plantar fasciitis   2. Gastrocnemius equinus of left lower extremity   3. Peroneal tendinitis of left lower extremity     Plan:  Patient was evaluated and treated and all questions answered.  S/p foot surgery left -She is doing very well and therapy has helped quite a bit.  She will continue this until she feels like she has made her maximal improvement.  I will see her back in 6 weeks for final visit.  May resume regular shoe gear and activity gradually including exercise  No follow-ups on file.

## 2022-04-24 ENCOUNTER — Encounter (HOSPITAL_COMMUNITY): Payer: Self-pay

## 2022-04-24 ENCOUNTER — Ambulatory Visit (HOSPITAL_COMMUNITY): Payer: Medicare PPO

## 2022-04-24 DIAGNOSIS — M25671 Stiffness of right ankle, not elsewhere classified: Secondary | ICD-10-CM

## 2022-04-24 DIAGNOSIS — M722 Plantar fascial fibromatosis: Secondary | ICD-10-CM

## 2022-04-24 DIAGNOSIS — Z7409 Other reduced mobility: Secondary | ICD-10-CM | POA: Diagnosis not present

## 2022-04-24 DIAGNOSIS — M25672 Stiffness of left ankle, not elsewhere classified: Secondary | ICD-10-CM | POA: Diagnosis not present

## 2022-04-24 NOTE — Therapy (Signed)
OUTPATIENT PHYSICAL THERAPY LOWER EXTREMITY TREATMENT   Patient Name: DEKAYLA PRESTRIDGE MRN: [id number] DOB:1950/08/23, 72 y.o., female Today's Date: 04/24/2022  END OF SESSION:   PT End of Session - 04/24/22 0908     Visit Number 6    Number of Visits 8    Date for PT Re-Evaluation 05/02/22    Authorization Type [employer]    Authorization Time Period 1 re-eval and 8 visits from 04/05/22 to 05/02/22    Authorization - Visit Number 6    Authorization - Number of Visits 8    Progress Note Due on Visit 8    PT Start Time 0901    PT Stop Time 0942    PT Time Calculation (min) 41 min    Activity Tolerance Patient tolerated treatment well    Behavior During Therapy [facility] for tasks assessed/performed               Past Medical History:  Diagnosis Date   Anxiety    Arthritis    in neck, in knee has gel shots givin in knee once a week for 3 weeks   BV (bacterial vaginosis) 04/06/2021   /329/23 rx flagyl   Cervical high risk HPV (human papillomavirus) test positive 04/06/2021   Repeat pap in 1 year per ASCCP guidelines 5 year risk for CIN3+ 4.8%   Depression    Hypertension    Past Surgical History:  Procedure Laterality Date   ANTERIOR CERVICAL DECOMP/DISCECTOMY FUSION N/A 04/29/2015   Procedure: ACDF C4-7;  Surgeon: Venita Lick, MD;  Location: [facility] OR;  Service: Orthopedics;  Laterality: N/A;   BIOPSY  05/16/2021   Procedure: BIOPSY;  Surgeon: Lanelle Bal, DO;  Location: [facility];  Service: Endoscopy;;  gastric erosion   cataract surgery     bilateral   COLONOSCOPY WITH PROPOFOL N/A 11/05/2018   Procedure: COLONOSCOPY WITH PROPOFOL;  Surgeon: West Bali, MD;  Location: [facility];  Service: Endoscopy;  Laterality: N/A;  10:30am   ESOPHAGOGASTRODUODENOSCOPY (EGD) WITH PROPOFOL N/A 05/16/2021   Procedure: ESOPHAGOGASTRODUODENOSCOPY (EGD) WITH PROPOFOL;  Surgeon: Lanelle Bal, DO;  Location: [facility];  Service: Endoscopy;  Laterality: N/A;    ESOPHAGOGASTRODUODENOSCOPY (EGD) WITH PROPOFOL N/A 05/15/2021   Procedure: ESOPHAGOGASTRODUODENOSCOPY (EGD) WITH PROPOFOL;  Surgeon: Dolores Frame, MD;  Location: [facility];  Service: Gastroenterology;  Laterality: N/A;   HAND SURGERY     rt   KNEE ARTHROPLASTY Right 04/03/2019   Procedure: COMPUTER ASSISTED TOTAL KNEE ARTHROPLASTY;  Surgeon: Samson Frederic, MD;  Location: [facility] OR[facility];  Service: Orthopedics;  Laterality: Right;   KNEE SURGERY     rt   Patient Active Problem List   Diagnosis Date Noted   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   Upper abdominal pain    Gastric foreign body    BV (bacterial vaginosis) 04/06/2021   Cervical high risk HPV (human papillomavirus) test positive 04/06/2021   Encounter for gynecological examination with Papanicolaou smear of cervix 03/31/2021   Encounter for screening fecal occult blood testing 03/31/2021   Screening mammogram for breast cancer 03/31/2021   Osteoarthritis of right knee 04/03/2019   Preventative health care 08/12/2018   Neck pain 04/29/2015   Degeneration of cervical intervertebral disc 09/24/2013   Pain in joint, shoulder region 09/24/2013   Muscle weakness (generalized) 09/24/2013   Decreased range of motion of neck 09/24/2013    PCP: Benita Stabile MD  REFERRING PROVIDER: Sharl Ma DPM  REFERRING DIAG:  M72.2 (ICD-10-CM) -  Plantar fasciitis  M76.72 (ICD-10-CM) - Peroneal tendinitis of left lower extremity  M62.462 (ICD-10-CM) - Gastrocnemius equinus of left lower extremity    THERAPY DIAG:  Plantar fasciitis of left foot  Decreased range of motion of both ankles  Impaired functional mobility, balance, gait, and endurance  Rationale for Evaluation and Treatment: Rehabilitation  ONSET DATE: 4.5 years ago  SUBJECTIVE:   SUBJECTIVE STATEMENT:  Reports she rode riding mower over weekend for about 45 minutes.  Stated it was a little difficult to push with Left foot due to weakness.  Arrived wearing heel lift to  address leg length discrepancy, feels it has been helping with walking.    EVAL:Foot started bothering er 4.83yrs ago and had a "hair fracture." MD put pt in boot. Pt reports "nail driving through foot" and was swelling throughout day. Potential hammer toe relation. Specialist wanted to pt in another boot but pt denied. March 1st of this year Left plantar fascial release. Pt has to wear boot at all times.   PERTINENT HISTORY: Osteoarthritis of right knee  Degeneration of cervical intervertebral disc  Pain in joint, shoulder region  Muscle weakness (generalized)  Decreased range of motion of neck  Neck pain  Preventative health care  More...  Encounter for gynecological examination with Papanicolaou smear of cervix  Encounter for screening fecal occult blood testing  Screening mammogram for breast cancer  BV (bacterial vaginosis)  Overview Signed 04/06/2021  2:53 PM by Adline Potter, NP   /329/23 rx flagyl    Cervical high risk HPV (human papillomavirus) test positive  Overview Signed 04/06/2021  2:56 PM by Adline Potter, NP   Repeat pap in 1 year per ASCCP guidelines 5 year risk for CIN3+ 4.8%    Upper abdominal pain  Gastric foreign body  Gastric erosion   PAIN:  Are you having pain? Yes: NPRS scale: 5/10 Pain location: Left foot and blister Pain description: "just hurts" Aggravating factors: lot of walking Relieving factors: Pain medications, very little ice  PRECAUTIONS: Other: WBAT in CAM boot  WEIGHT BEARING RESTRICTIONS: Yes WBAT  FALLS:  Has patient fallen in last 6 months? Yes. Number of falls 1, just caught up in rug.   LIVING ENVIRONMENT: Lives with: lives with their daughter Lives in: House/apartment Stairs: No Has following equipment at home: None  OCCUPATION: Retired  PLOF: Independent  PATIENT GOALS: "being able to wear tennis shoes back on"  NEXT MD VISIT: 04/06/2022  OBJECTIVE:   DIAGNOSTIC FINDINGS: IMPRESSION: 1. Moderate tendinosis  of the peroneus longus and brevis tendons without tear. 2. Plantar fasciitis. No tear. 3. 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  PATIENT SURVEYS:  FOTO 64.5%  COGNITION: Overall cognitive status: Within functional limits for tasks assessed     SENSATION: WFL    POSTURE: rounded shoulders and forward head  PALPATION: No TTP  LOWER EXTREMITY ROM:  Active ROM Right eval Left eval  Hip flexion    Hip extension    Hip abduction    Hip adduction    Hip internal rotation    Hip external rotation    Knee flexion    Knee extension    Ankle dorsiflexion KS: -9; -5 KS:5;15  Ankle plantarflexion    Ankle inversion 35 35  Ankle eversion 2 20   (Blank rows = not tested)  LOWER EXTREMITY MMT:  MMT Right eval Left eval  Hip flexion    Hip extension    Hip abduction    Hip adduction  Hip internal rotation    Hip external rotation    Knee flexion    Knee extension 4+ 4-  Ankle dorsiflexion 4+ 4-  Ankle plantarflexion 3 3  Ankle inversion 4+ 4+  Ankle eversion 3+ 3+   (Blank rows = not tested)  LOWER EXTREMITY SPECIAL TESTS:    FUNCTIONAL TESTS:  5 times sit to stand: 13.6 seconds 2 minute walk test: 269ft 10 meter walk test: 9 seconds  GAIT: Distance walked: 258ft Assistive device utilized:  Cam boot Level of assistance: Complete Independence Comments: antalgic gait with LLE, mild bilateral trendelenberg, small LOB with ambulation, independent recovery.    TODAY'S TREATMENT:                                                                                                                              DATE:  04/24/22: Heel raise 3 sets x 10 reps 343ft Toe raise 3 sets x 10 reps Plantar fascia stretch on step 3x 30"  Staggered tandem stance 2x 30" SLS 2-5" Squat 4 setsx 5 reps front of chair- cueing for mechanics Vector stance 3x 5" with 1 HHA  04/21/22: in normal shoes to improve mechanics 266ft Seated BAPS L2 PF/DF;  Inv/Ev, CW/CCW  Discussed weaning out of boot for pain control Heel raises seated then standing 10x 2 sets Toe raises 10x (limited Rt foot DF)seated then standing 10x 2 sets Rockerboard 2 min Inv/Ev; DF/PF  04/18/22 Seated Gastrocnemius stretch with a strap x 30" x 3 hamstring stretch x 30" x 3 ankle alphabets x 1 set Ankle 4-ways, RTB x 10 x 2 Plantar fascia mob with tennis ball x 2' Toe yoga x 20  Doming (arch lifts) x 20 Towel crunches x 2' Marble pick-ups x 2' Heel slides x 30  04/12/2022 Seated Gastrocnemius stretch with a strap x 30" x 3 hamstring stretch x 30" x 3 ankle alphabets x 1 set Ankle 4-ways, RTB x 10 x 2 Plantar fascia mob with tennis ball x 2' Toe yoga x 20  Doming (arch lifts) x 20 Towel crunches x 2' Marble pick-ups x 2' Heel slides x 30  04/10/2022 Review of HEP and goals Seated:  toe crunches x 2' Marble pick up x 2' Heel slides x 10 for dorsiflexion 4 way red theraband x 10 each Tennis ball plantar fascia massage x 2' Calf/hamstring stretch 5 x 20" with strap   04/05/22 PT Evaluation, HEP    PATIENT EDUCATION:  Education details: updated HEP Person educated: Patient Education method: Medical illustrator Education comprehension: verbalized understanding and returned demonstration  HOME EXERCISE PROGRAM: Access Code: [email or web address] URL: [email or web address] 04/24/22: SLS and tandem stance with intermittent HHA  04/21/22 -Standing heel and toe raises  04/12/2022 - Ankle Alphabet in Elevation  - 2 x daily - 7 x weekly - 1 sets - Seated Plantar Fascia Mobilization with Small Ball  - 1 x daily - 7 x weekly - Toe Yoga -  Alternating Great Toe and Lesser Toe Extension  - 1 x daily - 7 x weekly - 20 reps - Seated Arch Lifts  - 1 x daily - 7 x weekly - 20 reps  Date: 04/05/2022 Prepared by: Starling Manns  Exercises - Ankle Eversion with Resistance  - 1 x daily - 7 x weekly - 3 sets - 10 reps - Ankle Inversion with  Resistance  - 1 x daily - 7 x weekly - 3 sets - 10 reps - Ankle Dorsiflexion with Resistance  - 1 x daily - 7 x weekly - 3 sets - 10 reps - Towel Scrunches  - 1 x daily - 7 x weekly - 3 sets - 10 reps - Gastroc Stretch on Wall  - 1 x daily - 7 x weekly - 3 sets - 10 reps  ASSESSMENT:  CLINICAL IMPRESSION: Session focus with ankle mobility, strengthening and additional balance training for ankle stability.  Pt arrived wearing tennis shoes with additional heel lift to address leg length discrepancy with noted improved with gait, min cueing to improve toe push off to normalize mechanics.  Balance activities added with intermittent HHA for safety, added to HEP with printout given and verbalized understanding for safe location inside home.  No reports of pain through session, was limited with musculature fatigue.    Eval:  Pt is a pleasant 72 year old female who is presenting to physical therapy today for L ankle pain and plantar fasciitis. Jullianna is referred to physical therapy by referred for Peroneal tendinitis of LLE and Gastrocnemisu equinius of LLE. Pt's past medical history includes: R TKA. Pt currently is in CAM boot WBAT had lengthening of L gastrocnemius musculature on 03/16/22. No ROM restrictions present upon chart review.   Based upon today's evaluation, pt is demonstrating reduced functional mobility capcaity, mild balance deficits due to  ROM deficits, muscle weakness, and pain. Shawnae would benefit from skilled physical therapy services to address the above impairments/limitations and improve overall functional capacity and QOL. .   OBJECTIVE IMPAIRMENTS: Abnormal gait, decreased activity tolerance, decreased endurance, decreased mobility, difficulty walking, decreased ROM, and decreased strength.   ACTIVITY LIMITATIONS: lifting, bending, standing, and locomotion level  PARTICIPATION LIMITATIONS: community activity, occupation, and yard work  PERSONAL FACTORS: Age and Past/current  experiences are also affecting patient's functional outcome.   REHAB POTENTIAL: Good  CLINICAL DECISION MAKING: Stable/uncomplicated  EVALUATION COMPLEXITY: Low   GOALS: Goals reviewed with patient? No  SHORT TERM GOALS: Target date: 04/19/2022 Pt will be independent with HEP in order to demonstrate participation in Physical Therapy POC.  Baseline: See HEP section Goal status: IN PROGRESS  2.  Pt will report < 5/10 for pain in Left ankle to demonstrate improved functional activity tolerance Baseline: 5/10 Goal status: IN PROGRESS   LONG TERM GOALS: Target date: 05/06/2022  Pt will improve LLE ankle MMT > 4/5 in L to improve functional capacity tolerance. Baseline: see objective. Goal status: IN PROGRESS  2.  Pt will increase > 372ft in to demonstrate improve functional capacity and activity tolerance. Baseline: see objective Goal status: IN PROGRESS  3.  Pt will increased 5x STS to < 11 seconds to demonstrate increased functional LE strength.  Baseline: Assess next session.  Goal status: IN PROGRESS  4.  Pt will improve FOTO > 8 points to demonstrate improvements in overall functional output.  Baseline: see objective.  Goal status: IN PROGRESS    PLAN:  PT FREQUENCY: 2x/week  PT DURATION: 4 weeks  PLANNED INTERVENTIONS: Therapeutic exercises, Therapeutic activity, Neuromuscular re-education, Balance training, Gait training, Patient/Family education, Self Care, Joint mobilization, and Re-evaluation  PLAN FOR NEXT SESSION: Single leg balance once out of cam boot; intrinsic foot strengthening, calf stretching.   Becky Sax, LPTA/CLT; CBIS [phone]  10:03 AM, 04/24/22

## 2022-04-26 ENCOUNTER — Encounter (HOSPITAL_COMMUNITY): Payer: Medicare PPO

## 2022-04-27 ENCOUNTER — Ambulatory Visit
Admission: EM | Admit: 2022-04-27 | Discharge: 2022-04-27 | Disposition: A | Discharge status: Home / Self Care | Payer: Medicare PPO | Attending: Nurse Practitioner | Admitting: Nurse Practitioner

## 2022-04-27 ENCOUNTER — Encounter: Payer: Self-pay | Admitting: Emergency Medicine

## 2022-04-27 DIAGNOSIS — U071 COVID-19: Secondary | ICD-10-CM

## 2022-04-27 MED ORDER — MOLNUPIRAVIR EUA 200MG CAPSULE: 4.0000 | ORAL_CAPSULE | Freq: Two times a day (BID) | ORAL | 0 refills | Status: AC

## 2022-04-27 MED ORDER — PROMETHAZINE-DM 6.25-15 MG/5ML PO SYRP: 5.0000 mL | ORAL_SOLUTION | Freq: Four times a day (QID) | ORAL | 0 refills | Status: DC | PRN

## 2022-04-27 NOTE — Discharge Instructions (Signed)
Take medication as prescribed. Increase fluids and allow for plenty of rest. Recommend Tylenol as needed for pain, fever, general discomfort. You may use the nasal spray that you currently have at home to help with nasal congestion or runny nose. Recommend using a humidifier in your bedroom at nighttime during sleep and sleeping elevated on pillows of cough symptoms persist. As discussed, you should remain isolated until you have been without fever with no medication for at least 24 hours.  If you continue to experience symptoms after the first 5 days, you should continue to wear your mask for an additional 5 days. If you develop wheezing, shortness of breath, difficulty breathing, or other concerns, please follow-up in the emergency department for further evaluation. Follow-up as needed.

## 2022-04-27 NOTE — ED Provider Notes (Signed)
[facility]    CSN: [id number] Arrival date & time: 04/27/22  0800      History   Chief Complaint No chief complaint on file.   HPI Linda Page is a 72 y.o. female.   The history is provided by the patient.   The patient presents for complaints of scratchy throat, cough, headache, and watery eyes.  Symptoms started over the past 48 hours.  Patient denies fever, chills, ear pain, wheezing, shortness of breath, difficulty breathing, abdominal pain, nausea, vomiting, or diarrhea.  Patient reports that she tested positive for COVID at home this morning.  She has not been taking any medication for her symptoms.  Patient does have a history of COVID dating back to September of last year.  She states that the medication that she took worked well for her.  Patient reports history of high blood pressure and high cholesterol.  Denies any lung disease or kidney disease.  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   Upper abdominal pain    Gastric foreign body    BV (bacterial vaginosis) 04/06/2021   Cervical high risk HPV (human papillomavirus) test positive 04/06/2021   Encounter for gynecological examination with Papanicolaou smear of cervix 03/31/2021   Encounter for screening fecal occult blood testing 03/31/2021   Screening mammogram for breast cancer 03/31/2021   Osteoarthritis of right knee 04/03/2019   Preventative health care 08/12/2018   Neck pain 04/29/2015   Degeneration of cervical intervertebral disc 09/24/2013   Pain in joint, shoulder region 09/24/2013    Muscle weakness (generalized) 09/24/2013   Decreased range of motion of neck 09/24/2013    Past Surgical History:  Procedure Laterality Date   ANTERIOR CERVICAL DECOMP/DISCECTOMY FUSION N/A 04/29/2015   Procedure: ACDF C4-7;  Surgeon: Venita Lick, MD;  Location: [facility];  Service: Orthopedics;  Laterality: N/A;   BIOPSY  05/16/2021   Procedure: BIOPSY;  Surgeon: Lanelle Bal, DO;  [facility];  Service: Endoscopy;;  gastric erosion   cataract surgery     bilateral   COLONOSCOPY WITH PROPOFOL N/A 11/05/2018   Procedure: COLONOSCOPY WITH PROPOFOL;  Surgeon: West Bali, MD;  Location: [facility];  Service: Endoscopy;  Laterality: N/A;  10:30am   ESOPHAGOGASTRODUODENOSCOPY (EGD) WITH PROPOFOL N/A 05/16/2021   Procedure: ESOPHAGOGASTRODUODENOSCOPY (EGD) WITH PROPOFOL;  Surgeon: Lanelle Bal, DO;  [facility];  Service: Endoscopy;  Laterality: N/A;   ESOPHAGOGASTRODUODENOSCOPY (EGD) WITH PROPOFOL N/A 05/15/2021   Procedure: ESOPHAGOGASTRODUODENOSCOPY (EGD) WITH PROPOFOL;  Surgeon: Dolores Frame, MD;  [facility];  Service: Gastroenterology;  Laterality: N/A;   HAND SURGERY     rt   KNEE ARTHROPLASTY Right 04/03/2019   Procedure: COMPUTER ASSISTED TOTAL KNEE ARTHROPLASTY;  Surgeon: Samson Frederic, MD;  Location: [facility];  Service: Orthopedics;  Laterality: Right;   KNEE SURGERY     rt    OB History     Gravida  1   Para  1   Term  1   Preterm      AB      Living  1      SAB      IAB      Ectopic      Multiple      Live Births  1            Home Medications    Prior to Admission medications   Medication Sig Start Date End Date Taking? Authorizing Provider  molnupiravir EUA (LAGEVRIO) 200 mg CAPS capsule Take 4 capsules (800 mg total) by mouth 2 (two) times daily for 5 days. 04/27/22 05/02/22 Yes Wanda Cellucci-Warren, Sadie Haber, NP  promethazine-dextromethorphan (PROMETHAZINE-DM) 6.25-15 MG/5ML syrup Take 5 mLs  by mouth 4 (four) times daily as needed for cough. 04/27/22  Yes Shakyla Nolley-Warren, Sadie Haber, NP  ALPRAZolam Prudy Feeler) 1 MG tablet Take 1 mg by mouth at bedtime.    [provider]  atorvastatin (LIPITOR) 20 MG tablet Take 20 mg by mouth at bedtime.     [provider]  gabapentin (NEURONTIN) 300 MG capsule Take 1 capsule (300 mg total) by mouth 3 (three) times daily for 7 days. 03/10/22 03/17/22  McDonald, Rachelle Hora, DPM  lisinopril (ZESTRIL) 10 MG tablet Take 15 mg by mouth daily. 05/10/21   [provider]  Pseudoephedrine-Acetaminophen (SINUS PO) Take 1 tablet by mouth daily as needed (sinus/drainage).    [provider]  sertraline (ZOLOFT) 100 MG tablet Take 150 mg by mouth at bedtime.     [provider]  sucralfate (CARAFATE) 1 g tablet Take 1 tablet (1 g total) by mouth 4 (four) times daily for 14 days. 05/16/21 05/30/21  Lanelle Bal, DO    Family History Family History  Problem Relation Age of Onset   Kidney disease Father    Brain cancer Mother    Colon polyps Brother        age 6   Colon cancer Neg Hx     Social History Social History   Tobacco Use   Smoking status: Never   Smokeless tobacco: Never  Vaping Use   Vaping Use: Never used  Substance Use Topics   Alcohol use: No   Drug use: No     Allergies   Codeine and Other   Review of Systems Review of Systems Per HPI  Physical Exam Triage Vital Signs ED Triage Vitals  Enc Vitals Group     BP 04/27/22 0805 133/84     Pulse Rate 04/27/22 0805 93     Resp 04/27/22 0805 18     Temp 04/27/22 0805 98.5 F (36.9 C)     Temp Source 04/27/22 0805 Oral     SpO2 04/27/22 0805 93 %     Weight --      Height --      Head Circumference --      Peak Flow --      Pain Score 04/27/22 0806 0     Pain Loc --      Pain Edu? --      Excl. in GC? --    No data found.  Updated Vital Signs BP 133/84 (BP Location: Right Arm)   Pulse 93   Temp 98.5 F (36.9 C) (Oral)   Resp 18    SpO2 93%   Visual Acuity Right Eye Distance:   Left Eye Distance:   Bilateral Distance:    Right Eye Near:   Left Eye Near:  Bilateral Near:     Physical Exam Vitals and nursing note reviewed.  Constitutional:      General: She is not in acute distress.    Appearance: Normal appearance. She is well-developed.  HENT:     Head: Normocephalic.     Right Ear: Tympanic membrane, ear canal and external ear normal.     Left Ear: Tympanic membrane, ear canal and external ear normal.     Nose: Congestion present. No rhinorrhea.     Right Turbinates: Enlarged and swollen.     Left Turbinates: Enlarged and swollen.     Right Sinus: No maxillary sinus tenderness or frontal sinus tenderness.     Left Sinus: No maxillary sinus tenderness or frontal sinus tenderness.     Mouth/Throat:     Mouth: Mucous membranes are moist.     Pharynx: Posterior oropharyngeal erythema present.     Comments: Cobblestoning present on posterior oropharynx Eyes:     Extraocular Movements: Extraocular movements intact.     Conjunctiva/sclera: Conjunctivae normal.     Pupils: Pupils are equal, round, and reactive to light.  Cardiovascular:     Rate and Rhythm: Normal rate and regular rhythm.     Pulses: Normal pulses.     Heart sounds: Normal heart sounds.  Pulmonary:     Effort: Pulmonary effort is normal.     Breath sounds: Normal breath sounds.  Abdominal:     General: Bowel sounds are normal. There is no distension.     Palpations: Abdomen is soft.     Tenderness: There is no abdominal tenderness. There is no guarding or rebound.  Genitourinary:    Vagina: Normal. No vaginal discharge.  Musculoskeletal:     Cervical back: Normal range of motion.  Skin:    General: Skin is warm and dry.     Findings: No erythema or rash.  Neurological:     General: No focal deficit present.     Mental Status: She is alert and oriented to person, place, and time.     Cranial Nerves: No cranial nerve deficit.   Psychiatric:        Mood and Affect: Mood normal.        Behavior: Behavior normal.      UC Treatments / Results  Labs (all labs ordered are listed, but only abnormal results are displayed) Labs Reviewed - No data to display  EKG   Radiology No results found.  Procedures Procedures (including critical care time)  Medications Ordered in UC Medications - No data to display  Initial Impression / Assessment and Plan / UC Course  I have reviewed the triage vital signs and the nursing notes.  Pertinent labs & imaging results that were available during my care of the patient were reviewed by me and considered in my medical decision making (see chart for details).  The patient is well-appearing, she is in no acute distress, vital signs are stable.  Patient with positive home COVID test.  Will start patient on molnupiravir 200 mg capsules, and Promethazine DM for the cough.  Patient was given supportive care recommendations to include increasing fluids, allowing for plenty of rest, and use of Tylenol as needed for pain or discomfort.  Patient was provided strict ER follow-up precautions.  Patient is in agreement with this plan of care and verbalizes understanding.  All questions were answered.  Patient stable for discharge.   Final Clinical Impressions(s) / UC Diagnoses   Final diagnoses:  Positive self-administered antigen  test for COVID-19     Discharge Instructions      Take medication as prescribed. Increase fluids and allow for plenty of rest. Recommend Tylenol as needed for pain, fever, general discomfort. You may use the nasal spray that you currently have at home to help with nasal congestion or runny nose. Recommend using a humidifier in your bedroom at nighttime during sleep and sleeping elevated on pillows of cough symptoms persist. As discussed, you should remain isolated until you have been without fever with no medication for at least 24 hours.  If you continue  to experience symptoms after the first 5 days, you should continue to wear your mask for an additional 5 days. If you develop wheezing, shortness of breath, difficulty breathing, or other concerns, please follow-up in the emergency department for further evaluation. Follow-up as needed.     ED Prescriptions     Medication Sig Dispense Auth. Provider   molnupiravir EUA (LAGEVRIO) 200 mg CAPS capsule Take 4 capsules (800 mg total) by mouth 2 (two) times daily for 5 days. 40 capsule Zuleyma Scharf-Warren, Sadie Haber, NP   promethazine-dextromethorphan (PROMETHAZINE-DM) 6.25-15 MG/5ML syrup Take 5 mLs by mouth 4 (four) times daily as needed for cough. 118 mL Caylynn Minchew-Warren, Sadie Haber, NP      PDMP not reviewed this encounter.   Abran Cantor, NP 04/27/22 [phone]

## 2022-04-27 NOTE — ED Triage Notes (Signed)
Scratchy throat, cough, headache, watery eyes since Sunday.  Covid test this morning was positive.

## 2022-05-01 ENCOUNTER — Encounter (HOSPITAL_COMMUNITY): Payer: Medicare PPO

## 2022-05-03 ENCOUNTER — Encounter (HOSPITAL_COMMUNITY): Payer: Medicare PPO

## 2022-05-08 ENCOUNTER — Ambulatory Visit (HOSPITAL_COMMUNITY): Payer: Medicare PPO

## 2022-05-08 DIAGNOSIS — M25671 Stiffness of right ankle, not elsewhere classified: Secondary | ICD-10-CM

## 2022-05-08 DIAGNOSIS — M25672 Stiffness of left ankle, not elsewhere classified: Secondary | ICD-10-CM | POA: Diagnosis not present

## 2022-05-08 DIAGNOSIS — M722 Plantar fascial fibromatosis: Secondary | ICD-10-CM | POA: Diagnosis not present

## 2022-05-08 DIAGNOSIS — Z7409 Other reduced mobility: Secondary | ICD-10-CM

## 2022-05-08 NOTE — Therapy (Signed)
OUTPATIENT PHYSICAL THERAPY LOWER EXTREMITY TREATMENT/PROGRESS NOTE Progress Note Reporting Period 04/05/2022 to 05/08/2022  See note below for Objective Data and Assessment of Progress/Goals.       Patient Name: Linda Page MRN: [id number] DOB:1950/10/17, 72 y.o., female Today's Date: 05/08/2022  END OF SESSION:   PT End of Session - 05/08/22 0901     Visit Number 7    Number of Visits 8    Date for PT Re-Evaluation 06/05/22    Authorization Type [employer]    Authorization Time Period 1 re-eval and 8 visits from 04/05/22 to 05/02/22; requested more auth 05/08/22    Authorization - Visit Number 7    Authorization - Number of Visits 8    Progress Note Due on Visit 8    PT Start Time 0901    PT Stop Time 0941    PT Time Calculation (min) 40 min    Activity Tolerance Patient tolerated treatment well    Behavior During Therapy [facility] for tasks assessed/performed               Past Medical History:  Diagnosis Date   Anxiety    Arthritis    in neck, in knee has gel shots givin in knee once a week for 3 weeks   BV (bacterial vaginosis) 04/06/2021   /329/23 rx flagyl   Cervical high risk HPV (human papillomavirus) test positive 04/06/2021   Repeat pap in 1 year per ASCCP guidelines 5 year risk for CIN3+ 4.8%   Depression    Hypertension    Past Surgical History:  Procedure Laterality Date   ANTERIOR CERVICAL DECOMP/DISCECTOMY FUSION N/A 04/29/2015   Procedure: ACDF C4-7;  Surgeon: Venita Lick, MD;  Location: [facility];  Service: Orthopedics;  Laterality: N/A;   BIOPSY  05/16/2021   Procedure: BIOPSY;  Surgeon: Lanelle Bal, DO;  [facility];  Service: Endoscopy;;  gastric erosion   cataract surgery     bilateral   COLONOSCOPY WITH PROPOFOL N/A 11/05/2018   Procedure: COLONOSCOPY WITH PROPOFOL;  Surgeon: West Bali, MD;  [facility];  Service: Endoscopy;  Laterality: N/A;  10:30am   ESOPHAGOGASTRODUODENOSCOPY (EGD) WITH PROPOFOL N/A  05/16/2021   Procedure: ESOPHAGOGASTRODUODENOSCOPY (EGD) WITH PROPOFOL;  Surgeon: Lanelle Bal, DO;  [facility];  Service: Endoscopy;  Laterality: N/A;   ESOPHAGOGASTRODUODENOSCOPY (EGD) WITH PROPOFOL N/A 05/15/2021   Procedure: ESOPHAGOGASTRODUODENOSCOPY (EGD) WITH PROPOFOL;  Surgeon: Dolores Frame, MD;  [facility];  Service: Gastroenterology;  Laterality: N/A;   HAND SURGERY     rt   KNEE ARTHROPLASTY Right 04/03/2019   Procedure: COMPUTER ASSISTED TOTAL KNEE ARTHROPLASTY;  Surgeon: Samson Frederic, MD;  Location: [facility];  Service: Orthopedics;  Laterality: Right;   KNEE SURGERY     rt   Patient Active Problem List   Diagnosis Date Noted   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   Upper abdominal pain    Gastric foreign body    BV (bacterial vaginosis) 04/06/2021   Cervical high risk HPV (human papillomavirus) test positive 04/06/2021   Encounter for gynecological examination with Papanicolaou smear of cervix 03/31/2021   Encounter for screening fecal occult blood testing 03/31/2021   Screening mammogram for breast cancer 03/31/2021   Osteoarthritis of right knee 04/03/2019   Preventative health care 08/12/2018   Neck pain 04/29/2015   Degeneration of cervical intervertebral disc 09/24/2013   Pain in joint, shoulder region 09/24/2013   Muscle weakness (generalized) 09/24/2013   Decreased  range of motion of neck 09/24/2013    PCP: Benita Stabile MD  REFERRING PROVIDER: Sharl Ma DPM  REFERRING DIAG:  M72.2 (ICD-10-CM) - Plantar fasciitis  M76.72 (ICD-10-CM) - Peroneal tendinitis of left lower extremity  M62.462 (ICD-10-CM) - Gastrocnemius equinus of left lower extremity    THERAPY DIAG:  Plantar fasciitis of left foot - Plan: PT plan of care cert/re-cert  Decreased range of motion of both ankles - Plan: PT plan of care cert/re-cert  Impaired functional mobility, balance, gait, and endurance - Plan: PT plan of care cert/re-cert  Rationale for  Evaluation and Treatment: Rehabilitation  ONSET DATE: 4.5 years ago  SUBJECTIVE:   SUBJECTIVE STATEMENT:  Had to miss some appointments due to being sick with COVD; MD told her no running or jumping at this time.  Reports she is "90%" better; reports some tingling outside of the foot and some soreness in calf. 1/10 pain today  EVAL:Foot started bothering er 4.57yrs ago and had a "hair fracture." MD put pt in boot. Pt reports "nail driving through foot" and was swelling throughout day. Potential hammer toe relation. Specialist wanted to pt in another boot but pt denied. March 1st of this year Left plantar fascial release. Pt has to wear boot at all times.   PERTINENT HISTORY: Osteoarthritis of right knee  Degeneration of cervical intervertebral disc  Pain in joint, shoulder region  Muscle weakness (generalized)  Decreased range of motion of neck  Neck pain  Preventative health care  More...  Encounter for gynecological examination with Papanicolaou smear of cervix  Encounter for screening fecal occult blood testing  Screening mammogram for breast cancer  BV (bacterial vaginosis)  Overview Signed 04/06/2021  2:53 PM by Adline Potter, NP   /329/23 rx flagyl    Cervical high risk HPV (human papillomavirus) test positive  Overview Signed 04/06/2021  2:56 PM by Adline Potter, NP   Repeat pap in 1 year per ASCCP guidelines 5 year risk for CIN3+ 4.8%    Upper abdominal pain  Gastric foreign body  Gastric erosion   PAIN:  Are you having pain? Yes: NPRS scale: 5/10 Pain location: Left foot and blister Pain description: "just hurts" Aggravating factors: lot of walking Relieving factors: Pain medications, very little ice  PRECAUTIONS: Other: WBAT in CAM boot  WEIGHT BEARING RESTRICTIONS: Yes WBAT  FALLS:  Has patient fallen in last 6 months? Yes. Number of falls 1, just caught up in rug.   LIVING ENVIRONMENT: Lives with: lives with their daughter Lives in:  House/apartment Stairs: No Has following equipment at home: None  OCCUPATION: Retired  PLOF: Independent  PATIENT GOALS: "being able to wear tennis shoes back on"  NEXT MD VISIT: 04/06/2022  OBJECTIVE:   DIAGNOSTIC FINDINGS: IMPRESSION: 1. Moderate tendinosis of the peroneus longus and brevis tendons without tear. 2. Plantar fasciitis. No tear. 3. 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  PATIENT SURVEYS:  FOTO 64.5%  COGNITION: Overall cognitive status: Within functional limits for tasks assessed     SENSATION: WFL    POSTURE: rounded shoulders and forward head  PALPATION: No TTP  LOWER EXTREMITY ROM:  Active ROM Right eval Left eval Left 05/08/22  Hip flexion     Hip extension     Hip abduction     Hip adduction     Hip internal rotation     Hip external rotation     Knee flexion     Knee extension     Ankle  dorsiflexion KS: -9; -5 KS:5;15 10  Ankle plantarflexion   35  Ankle inversion 35 35 38  Ankle eversion 2 20 10    (Blank rows = not tested)  LOWER EXTREMITY MMT:  MMT Right eval Left eval Left 05/08/22  Hip flexion     Hip extension     Hip abduction     Hip adduction     Hip internal rotation     Hip external rotation     Knee flexion     Knee extension 4+ 4- 5  Ankle dorsiflexion 4+ 4- 5  Ankle plantarflexion 3 3 4-  Ankle inversion 4+ 4+ 5  Ankle eversion 3+ 3+ 4+   (Blank rows = not tested)  LOWER EXTREMITY SPECIAL TESTS:    FUNCTIONAL TESTS:  5 times sit to stand: 13.6 seconds 2 minute walk test: 229ft 10 meter walk test: 9 seconds  GAIT: Distance walked: 272ft Assistive device utilized:  Cam boot Level of assistance: Complete Independence Comments: antalgic gait with LLE, mild bilateral trendelenberg, small LOB with ambulation, independent recovery.    TODAY'S TREATMENT:                                                                                                                              DATE:   05/08/22 Progress note FOTO 63 ( MD told her not to run or hop) 5 times sit to stand 7.53 sec no UE assist 2 MWT 365 ft SLS 6 sec on left today AROM and MMT's see above  04/24/22: Heel raise 3 sets x 10 reps 351ft Toe raise 3 sets x 10 reps Plantar fascia stretch on step 3x 30"  Staggered tandem stance 2x 30" SLS 2-5" Squat 4 setsx 5 reps front of chair- cueing for mechanics Vector stance 3x 5" with 1 HHA  04/21/22: in normal shoes to improve mechanics 28ft Seated BAPS L2 PF/DF; Inv/Ev, CW/CCW  Discussed weaning out of boot for pain control Heel raises seated then standing 10x 2 sets Toe raises 10x (limited Rt foot DF)seated then standing 10x 2 sets Rockerboard 2 min Inv/Ev; DF/PF  04/18/22 Seated Gastrocnemius stretch with a strap x 30" x 3 hamstring stretch x 30" x 3 ankle alphabets x 1 set Ankle 4-ways, RTB x 10 x 2 Plantar fascia mob with tennis ball x 2' Toe yoga x 20  Doming (arch lifts) x 20 Towel crunches x 2' Marble pick-ups x 2' Heel slides x 30  04/12/2022 Seated Gastrocnemius stretch with a strap x 30" x 3 hamstring stretch x 30" x 3 ankle alphabets x 1 set Ankle 4-ways, RTB x 10 x 2 Plantar fascia mob with tennis ball x 2' Toe yoga x 20  Doming (arch lifts) x 20 Towel crunches x 2' Marble pick-ups x 2' Heel slides x 30  04/10/2022 Review of HEP and goals Seated:  toe crunches x 2' Marble pick up x 2' Heel slides x 10 for dorsiflexion  4 way red theraband x 10 each Tennis ball plantar fascia massage x 2' Calf/hamstring stretch 5 x 20" with strap   04/05/22 PT Evaluation, HEP    PATIENT EDUCATION:  Education details: updated HEP Person educated: Patient Education method: Medical illustrator Education comprehension: verbalized understanding and returned demonstration  HOME EXERCISE PROGRAM: Access Code: [email or web address] URL: [email or web address] 04/24/22: SLS and tandem stance with intermittent  HHA  04/21/22 -Standing heel and toe raises  04/12/2022 - Ankle Alphabet in Elevation  - 2 x daily - 7 x weekly - 1 sets - Seated Plantar Fascia Mobilization with Small Ball  - 1 x daily - 7 x weekly - Toe Yoga - Alternating Great Toe and Lesser Toe Extension  - 1 x daily - 7 x weekly - 20 reps - Seated Arch Lifts  - 1 x daily - 7 x weekly - 20 reps  Date: 04/05/2022 Prepared by: Starling Manns  Exercises - Ankle Eversion with Resistance  - 1 x daily - 7 x weekly - 3 sets - 10 reps - Ankle Inversion with Resistance  - 1 x daily - 7 x weekly - 3 sets - 10 reps - Ankle Dorsiflexion with Resistance  - 1 x daily - 7 x weekly - 3 sets - 10 reps - Towel Scrunches  - 1 x daily - 7 x weekly - 3 sets - 10 reps - Gastroc Stretch on Wall  - 1 x daily - 7 x weekly - 3 sets - 10 reps  ASSESSMENT:  CLINICAL IMPRESSION: Progress note today for [employer]; met her goals for her sit to stand and 2 MWT today.  Added a goal for SLS today.  Recommend continued therapy 1 x a week for 4 weeks to address remaining unmet and partially met goals.    Eval:  Pt is a pleasant 72 year old female who is presenting to physical therapy today for L ankle pain and plantar fasciitis. Khrystina is referred to physical therapy by referred for Peroneal tendinitis of LLE and Gastrocnemisu equinius of LLE. Pt's past medical history includes: R TKA. Pt currently is in CAM boot WBAT had lengthening of L gastrocnemius musculature on 03/16/22. No ROM restrictions present upon chart review.   Based upon today's evaluation, pt is demonstrating reduced functional mobility capcaity, mild balance deficits due to  ROM deficits, muscle weakness, and pain. Jailah would benefit from skilled physical therapy services to address the above impairments/limitations and improve overall functional capacity and QOL. .   OBJECTIVE IMPAIRMENTS: Abnormal gait, decreased activity tolerance, decreased endurance, decreased mobility, difficulty walking, decreased  ROM, and decreased strength.   ACTIVITY LIMITATIONS: lifting, bending, standing, and locomotion level  PARTICIPATION LIMITATIONS: community activity, occupation, and yard work  PERSONAL FACTORS: Age and Past/current experiences are also affecting patient's functional outcome.   REHAB POTENTIAL: Good  CLINICAL DECISION MAKING: Stable/uncomplicated  EVALUATION COMPLEXITY: Low   GOALS: Goals reviewed with patient? No  SHORT TERM GOALS: Target date: 04/19/2022 Pt will be independent with HEP in order to demonstrate participation in Physical Therapy POC.  Baseline: See HEP section Goal status: MET  2.  Pt will report < 5/10 for pain in Left ankle to demonstrate improved functional activity tolerance Baseline: 5/10; 1/10 pain 05/08/22 Goal status: MET   LONG TERM GOALS: Target date: 05/06/2022  Pt will improve LLE ankle MMT > 4/5 in L to improve functional capacity tolerance. Baseline: see objective. Goal status: IN PROGRESS  2.  Pt will increase > 361ft in  to demonstrate improve functional capacity and activity tolerance. Baseline: see objective; 05/08/22 365 ft Goal status: MET  3.  Pt will increased 5x STS to < 11 seconds to demonstrate increased functional LE strength.  Baseline: Assess next session. 05/08/22 7.53 sec Goal status: MET  4.  Pt will improve FOTO > 8 points to demonstrate improvements in overall functional output.  Baseline: see objective. 05/08/22 63 Goal status: IN PROGRESS  5. Patient will be able to stand on her left foot x 10 sec to increase functional balance  Baseline 05/08/22 6 sec  Goal status: in progress    PLAN:  PT FREQUENCY: 2x/week  PT DURATION: 4 weeks  PLANNED INTERVENTIONS: Therapeutic exercises, Therapeutic activity, Neuromuscular re-education, Balance training, Gait training, Patient/Family education, Self Care, Joint mobilization, and Re-evaluation  PLAN FOR NEXT SESSION:  intrinsic foot strengthening, calf stretching. Balance  and plantar flexion strength  9:45 AM, 05/08/22 Chenise Mulvihill Small Pegah Segel MPT [facility] [place] [phone]

## 2022-05-10 ENCOUNTER — Ambulatory Visit (HOSPITAL_COMMUNITY): Payer: Medicare PPO | Attending: Podiatry

## 2022-05-10 DIAGNOSIS — M722 Plantar fascial fibromatosis: Secondary | ICD-10-CM | POA: Diagnosis not present

## 2022-05-10 DIAGNOSIS — M25672 Stiffness of left ankle, not elsewhere classified: Secondary | ICD-10-CM | POA: Diagnosis not present

## 2022-05-10 DIAGNOSIS — Z7409 Other reduced mobility: Secondary | ICD-10-CM | POA: Diagnosis not present

## 2022-05-10 DIAGNOSIS — M25671 Stiffness of right ankle, not elsewhere classified: Secondary | ICD-10-CM

## 2022-05-10 NOTE — Therapy (Signed)
OUTPATIENT PHYSICAL THERAPY LOWER EXTREMITY TREATMENT      Patient Name: Linda Page MRN: [id number] DOB:Mar 13, 1950, 72 y.o., female Today's Date: 05/10/2022  END OF SESSION:   PT End of Session - 05/10/22 0904     Visit Number 8    Number of Visits 12    Date for PT Re-Evaluation 06/05/22    Authorization Type [employer]    Authorization Time Period 1 re-eval and 4 visits from 05/08/22 to 06/05/22    Authorization - Visit Number 1    Authorization - Number of Visits 4    Progress Note Due on Visit 4    PT Start Time 0905    PT Stop Time 0944    PT Time Calculation (min) 39 min    Activity Tolerance Patient tolerated treatment well    Behavior During Therapy [facility] for tasks assessed/performed               Past Medical History:  Diagnosis Date   Anxiety    Arthritis    in neck, in knee has gel shots givin in knee once a week for 3 weeks   BV (bacterial vaginosis) 04/06/2021   /329/23 rx flagyl   Cervical high risk HPV (human papillomavirus) test positive 04/06/2021   Repeat pap in 1 year per ASCCP guidelines 5 year risk for CIN3+ 4.8%   Depression    Hypertension    Past Surgical History:  Procedure Laterality Date   ANTERIOR CERVICAL DECOMP/DISCECTOMY FUSION N/A 04/29/2015   Procedure: ACDF C4-7;  Surgeon: Venita Lick, MD;  Location: [facility] OR;  Service: Orthopedics;  Laterality: N/A;   BIOPSY  05/16/2021   Procedure: BIOPSY;  Surgeon: Lanelle Bal, DO;  Location: [facility];  Service: Endoscopy;;  gastric erosion   cataract surgery     bilateral   COLONOSCOPY WITH PROPOFOL N/A 11/05/2018   Procedure: COLONOSCOPY WITH PROPOFOL;  Surgeon: West Bali, MD;  Location: [facility];  Service: Endoscopy;  Laterality: N/A;  10:30am   ESOPHAGOGASTRODUODENOSCOPY (EGD) WITH PROPOFOL N/A 05/16/2021   Procedure: ESOPHAGOGASTRODUODENOSCOPY (EGD) WITH PROPOFOL;  Surgeon: Lanelle Bal, DO;  Location: [facility];  Service: Endoscopy;  Laterality: N/A;    ESOPHAGOGASTRODUODENOSCOPY (EGD) WITH PROPOFOL N/A 05/15/2021   Procedure: ESOPHAGOGASTRODUODENOSCOPY (EGD) WITH PROPOFOL;  Surgeon: Dolores Frame, MD;  Location: [facility];  Service: Gastroenterology;  Laterality: N/A;   HAND SURGERY     rt   KNEE ARTHROPLASTY Right 04/03/2019   Procedure: COMPUTER ASSISTED TOTAL KNEE ARTHROPLASTY;  Surgeon: Samson Frederic, MD;  Location: [facility];  Service: Orthopedics;  Laterality: Right;   KNEE SURGERY     rt   Patient Active Problem List   Diagnosis Date Noted   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   Upper abdominal pain    Gastric foreign body    BV (bacterial vaginosis) 04/06/2021   Cervical high risk HPV (human papillomavirus) test positive 04/06/2021   Encounter for gynecological examination with Papanicolaou smear of cervix 03/31/2021   Encounter for screening fecal occult blood testing 03/31/2021   Screening mammogram for breast cancer 03/31/2021   Osteoarthritis of right knee 04/03/2019   Preventative health care 08/12/2018   Neck pain 04/29/2015   Degeneration of cervical intervertebral disc 09/24/2013   Pain in joint, shoulder region 09/24/2013   Muscle weakness (generalized) 09/24/2013   Decreased range of motion of neck 09/24/2013    PCP: Benita Stabile MD  REFERRING PROVIDER: Sharl Ma DPM  REFERRING DIAG:  M72.2 (ICD-10-CM) - Plantar fasciitis  M76.72 (ICD-10-CM) - Peroneal tendinitis of left lower extremity  M62.462 (ICD-10-CM) - Gastrocnemius equinus of left lower extremity    THERAPY DIAG:  Plantar fasciitis of left foot  Decreased range of motion of both ankles  Impaired functional mobility, balance, gait, and endurance  Rationale for Evaluation and Treatment: Rehabilitation  ONSET DATE: 4.5 years ago  SUBJECTIVE:   SUBJECTIVE STATEMENT:  Had to miss some appointments due to being sick with COVD; MD told her no running or jumping at this time.  Reports she is "90%" better; reports some tingling outside of  the foot and some soreness in calf. 1/10 pain today  EVAL:Foot started bothering er 4.33yrs ago and had a "hair fracture." MD put pt in boot. Pt reports "nail driving through foot" and was swelling throughout day. Potential hammer toe relation. Specialist wanted to pt in another boot but pt denied. March 1st of this year Left plantar fascial release. Pt has to wear boot at all times.   PERTINENT HISTORY: Osteoarthritis of right knee  Degeneration of cervical intervertebral disc  Pain in joint, shoulder region  Muscle weakness (generalized)  Decreased range of motion of neck  Neck pain  Preventative health care  More...  Encounter for gynecological examination with Papanicolaou smear of cervix  Encounter for screening fecal occult blood testing  Screening mammogram for breast cancer  BV (bacterial vaginosis)  Overview Signed 04/06/2021  2:53 PM by Adline Potter, NP   /329/23 rx flagyl    Cervical high risk HPV (human papillomavirus) test positive  Overview Signed 04/06/2021  2:56 PM by Adline Potter, NP   Repeat pap in 1 year per ASCCP guidelines 5 year risk for CIN3+ 4.8%    Upper abdominal pain  Gastric foreign body  Gastric erosion   PAIN:  Are you having pain? Yes: NPRS scale: 5/10 Pain location: Left foot and blister Pain description: "just hurts" Aggravating factors: lot of walking Relieving factors: Pain medications, very little ice  PRECAUTIONS: Other: WBAT in CAM boot  WEIGHT BEARING RESTRICTIONS: Yes WBAT  FALLS:  Has patient fallen in last 6 months? Yes. Number of falls 1, just caught up in rug.   LIVING ENVIRONMENT: Lives with: lives with their daughter Lives in: House/apartment Stairs: No Has following equipment at home: None  OCCUPATION: Retired  PLOF: Independent  PATIENT GOALS: "being able to wear tennis shoes back on"  NEXT MD VISIT: 04/06/2022  OBJECTIVE:   DIAGNOSTIC FINDINGS: IMPRESSION: 1. Moderate tendinosis of the peroneus  longus and brevis tendons without tear. 2. Plantar fasciitis. No tear. 3. 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  PATIENT SURVEYS:  FOTO 64.5%  COGNITION: Overall cognitive status: Within functional limits for tasks assessed     SENSATION: WFL    POSTURE: rounded shoulders and forward head  PALPATION: No TTP  LOWER EXTREMITY ROM:  Active ROM Right eval Left eval Left 05/08/22  Hip flexion     Hip extension     Hip abduction     Hip adduction     Hip internal rotation     Hip external rotation     Knee flexion     Knee extension     Ankle dorsiflexion KS: -9; -5 KS:5;15 10  Ankle plantarflexion   35  Ankle inversion 35 35 38  Ankle eversion 2 20 10    (Blank rows = not tested)  LOWER EXTREMITY MMT:  MMT Right eval Left eval Left 05/08/22  Hip  flexion     Hip extension     Hip abduction     Hip adduction     Hip internal rotation     Hip external rotation     Knee flexion     Knee extension 4+ 4- 5  Ankle dorsiflexion 4+ 4- 5  Ankle plantarflexion 3 3 4-  Ankle inversion 4+ 4+ 5  Ankle eversion 3+ 3+ 4+   (Blank rows = not tested)  LOWER EXTREMITY SPECIAL TESTS:    FUNCTIONAL TESTS:  5 times sit to stand: 13.6 seconds 2 minute walk test: 226ft 10 meter walk test: 9 seconds  GAIT: Distance walked: 24ft Assistive device utilized:  Cam boot Level of assistance: Complete Independence Comments: antalgic gait with LLE, mild bilateral trendelenberg, small LOB with ambulation, independent recovery.    TODAY'S TREATMENT:                                                                                                                              DATE:  05/10/22 Nustep seat 8 x 5' level 2 dynamic warm up  Standing: Heel/toe raises 2 x 10 Slant board 5 x 20" Squats 2 x 10 Tandem stance 2 x 30" BAPS board level 3 CW and CCW x 1' each Hip vectors 2" hold 2 x 5 each Alternating lunges 4" 2 x 10 each  05/08/22 Progress note FOTO 63  ( MD told her not to run or hop) 5 times sit to stand 7.53 sec no UE assist 2 MWT 365 ft SLS 6 sec on left today AROM and MMT's see above  04/24/22: Heel raise 3 sets x 10 reps 357ft Toe raise 3 sets x 10 reps Plantar fascia stretch on step 3x 30"  Staggered tandem stance 2x 30" SLS 2-5" Squat 4 setsx 5 reps front of chair- cueing for mechanics Vector stance 3x 5" with 1 HHA  04/21/22: in normal shoes to improve mechanics 249ft Seated BAPS L2 PF/DF; Inv/Ev, CW/CCW  Discussed weaning out of boot for pain control Heel raises seated then standing 10x 2 sets Toe raises 10x (limited Rt foot DF)seated then standing 10x 2 sets Rockerboard 2 min Inv/Ev; DF/PF  04/18/22 Seated Gastrocnemius stretch with a strap x 30" x 3 hamstring stretch x 30" x 3 ankle alphabets x 1 set Ankle 4-ways, RTB x 10 x 2 Plantar fascia mob with tennis ball x 2' Toe yoga x 20  Doming (arch lifts) x 20 Towel crunches x 2' Marble pick-ups x 2' Heel slides x 30  04/12/2022 Seated Gastrocnemius stretch with a strap x 30" x 3 hamstring stretch x 30" x 3 ankle alphabets x 1 set Ankle 4-ways, RTB x 10 x 2 Plantar fascia mob with tennis ball x 2' Toe yoga x 20  Doming (arch lifts) x 20 Towel crunches x 2' Marble pick-ups x 2' Heel slides x 30  04/10/2022 Review of HEP and goals Seated:  toe crunches  x 2' Marble pick up x 2' Heel slides x 10 for dorsiflexion 4 way red theraband x 10 each Tennis ball plantar fascia massage x 2' Calf/hamstring stretch 5 x 20" with strap   04/05/22 PT Evaluation, HEP    PATIENT EDUCATION:  Education details: updated HEP Person educated: Patient Education method: Medical illustrator Education comprehension: verbalized understanding and returned demonstration  HOME EXERCISE PROGRAM: Access Code: [email or web address] URL: [email or web address] 04/24/22: SLS and tandem stance with intermittent HHA  04/21/22 -Standing heel and toe  raises  04/12/2022 - Ankle Alphabet in Elevation  - 2 x daily - 7 x weekly - 1 sets - Seated Plantar Fascia Mobilization with Small Ball  - 1 x daily - 7 x weekly - Toe Yoga - Alternating Great Toe and Lesser Toe Extension  - 1 x daily - 7 x weekly - 20 reps - Seated Arch Lifts  - 1 x daily - 7 x weekly - 20 reps  Date: 04/05/2022 Prepared by: Starling Manns  Exercises - Ankle Eversion with Resistance  - 1 x daily - 7 x weekly - 3 sets - 10 reps - Ankle Inversion with Resistance  - 1 x daily - 7 x weekly - 3 sets - 10 reps - Ankle Dorsiflexion with Resistance  - 1 x daily - 7 x weekly - 3 sets - 10 reps - Towel Scrunches  - 1 x daily - 7 x weekly - 3 sets - 10 reps - Gastroc Stretch on Wall  - 1 x daily - 7 x weekly - 3 sets - 10 reps  ASSESSMENT:  CLINICAL IMPRESSION: Today's session continued to work on lower extremity strengthening, mobility and balance.  Added alternating lunges to work on balance; patient needs intermittent hands to assist with balance with lunges. Patient continues with slight antalgic gait; of note she has shorter right leg than left.  Patient will benefit from continued skilled therapy services to address deficits and promote return to optimal function.       Eval:  Pt is a pleasant 72 year old female who is presenting to physical therapy today for L ankle pain and plantar fasciitis. Jalaila is referred to physical therapy by referred for Peroneal tendinitis of LLE and Gastrocnemisu equinius of LLE. Pt's past medical history includes: R TKA. Pt currently is in CAM boot WBAT had lengthening of L gastrocnemius musculature on 03/16/22. No ROM restrictions present upon chart review.   Based upon today's evaluation, pt is demonstrating reduced functional mobility capcaity, mild balance deficits due to  ROM deficits, muscle weakness, and pain. Vanesha would benefit from skilled physical therapy services to address the above impairments/limitations and improve overall  functional capacity and QOL. .   OBJECTIVE IMPAIRMENTS: Abnormal gait, decreased activity tolerance, decreased endurance, decreased mobility, difficulty walking, decreased ROM, and decreased strength.   ACTIVITY LIMITATIONS: lifting, bending, standing, and locomotion level  PARTICIPATION LIMITATIONS: community activity, occupation, and yard work  PERSONAL FACTORS: Age and Past/current experiences are also affecting patient's functional outcome.   REHAB POTENTIAL: Good  CLINICAL DECISION MAKING: Stable/uncomplicated  EVALUATION COMPLEXITY: Low   GOALS: Goals reviewed with patient? No  SHORT TERM GOALS: Target date: 04/19/2022 Pt will be independent with HEP in order to demonstrate participation in Physical Therapy POC.  Baseline: See HEP section Goal status: MET  2.  Pt will report < 5/10 for pain in Left ankle to demonstrate improved functional activity tolerance Baseline: 5/10; 1/10 pain 05/08/22 Goal status: MET   LONG TERM GOALS:  Target date: 05/06/2022  Pt will improve LLE ankle MMT > 4/5 in L to improve functional capacity tolerance. Baseline: see objective. Goal status: IN PROGRESS  2.  Pt will increase > 320ft in to demonstrate improve functional capacity and activity tolerance. Baseline: see objective; 05/08/22 365 ft Goal status: MET  3.  Pt will increased 5x STS to < 11 seconds to demonstrate increased functional LE strength.  Baseline: Assess next session. 05/08/22 7.53 sec Goal status: MET  4.  Pt will improve FOTO > 8 points to demonstrate improvements in overall functional output.  Baseline: see objective. 05/08/22 63 Goal status: IN PROGRESS  5. Patient will be able to stand on her left foot x 10 sec to increase functional balance  Baseline 05/08/22 6 sec  Goal status: in progress    PLAN:  PT FREQUENCY: 2x/week  PT DURATION: 4 weeks  PLANNED INTERVENTIONS: Therapeutic exercises, Therapeutic activity, Neuromuscular re-education, Balance  training, Gait training, Patient/Family education, Self Care, Joint mobilization, and Re-evaluation  PLAN FOR NEXT SESSION:  intrinsic foot strengthening, calf stretching. Balance and plantar flexion strength  9:46 AM, 05/10/22 Kaito Schulenburg Small Durga Saldarriaga MPT [facility] [place] [phone] Ph:[phone]

## 2022-05-17 ENCOUNTER — Ambulatory Visit (HOSPITAL_COMMUNITY): Payer: Medicare PPO

## 2022-05-17 ENCOUNTER — Encounter (HOSPITAL_COMMUNITY): Payer: Self-pay

## 2022-05-17 DIAGNOSIS — M722 Plantar fascial fibromatosis: Secondary | ICD-10-CM

## 2022-05-17 DIAGNOSIS — M25671 Stiffness of right ankle, not elsewhere classified: Secondary | ICD-10-CM

## 2022-05-17 DIAGNOSIS — Z7409 Other reduced mobility: Secondary | ICD-10-CM

## 2022-05-17 DIAGNOSIS — M25672 Stiffness of left ankle, not elsewhere classified: Secondary | ICD-10-CM | POA: Diagnosis not present

## 2022-05-17 NOTE — Therapy (Signed)
OUTPATIENT PHYSICAL THERAPY LOWER EXTREMITY TREATMENT      Patient Name: LEYONNA GEHRMANN MRN: [id number] DOB:December 19, 1950, 72 y.o., female Today's Date: 05/17/2022  END OF SESSION:   PT End of Session - 05/17/22 0923     Visit Number 9    Number of Visits 12    Date for PT Re-Evaluation 06/05/22    Authorization Type [employer]    Authorization Time Period 1 re-eval and 4 visits from 05/08/22 to 06/05/22    Authorization - Visit Number 2    Authorization - Number of Visits 4    Progress Note Due on Visit 4    PT Start Time 0902    PT Stop Time 0944    PT Time Calculation (min) 42 min    Activity Tolerance Patient tolerated treatment well    Behavior During Therapy [facility] for tasks assessed/performed                Past Medical History:  Diagnosis Date   Anxiety    Arthritis    in neck, in knee has gel shots givin in knee once a week for 3 weeks   BV (bacterial vaginosis) 04/06/2021   /329/23 rx flagyl   Cervical high risk HPV (human papillomavirus) test positive 04/06/2021   Repeat pap in 1 year per ASCCP guidelines 5 year risk for CIN3+ 4.8%   Depression    Hypertension    Past Surgical History:  Procedure Laterality Date   ANTERIOR CERVICAL DECOMP/DISCECTOMY FUSION N/A 04/29/2015   Procedure: ACDF C4-7;  Surgeon: Venita Lick, MD;  Location: [facility];  Service: Orthopedics;  Laterality: N/A;   BIOPSY  05/16/2021   Procedure: BIOPSY;  Surgeon: Lanelle Bal, DO;  [facility]: [facility];  Service: Endoscopy;;  gastric erosion   cataract surgery     bilateral   COLONOSCOPY WITH PROPOFOL N/A 11/05/2018   Procedure: COLONOSCOPY WITH PROPOFOL;  Surgeon: West Bali, MD;  Location: [facility];  Service: Endoscopy;  Laterality: N/A;  10:30am   ESOPHAGOGASTRODUODENOSCOPY (EGD) WITH PROPOFOL N/A 05/16/2021   Procedure: ESOPHAGOGASTRODUODENOSCOPY (EGD) WITH PROPOFOL;  Surgeon: Lanelle Bal, DO;  [facility];  Service: Endoscopy;  Laterality: N/A;    ESOPHAGOGASTRODUODENOSCOPY (EGD) WITH PROPOFOL N/A 05/15/2021   Procedure: ESOPHAGOGASTRODUODENOSCOPY (EGD) WITH PROPOFOL;  Surgeon: Dolores Frame, MD;  [facility]: [facility];  Service: Gastroenterology;  Laterality: N/A;   HAND SURGERY     rt   KNEE ARTHROPLASTY Right 04/03/2019   Procedure: COMPUTER ASSISTED TOTAL KNEE ARTHROPLASTY;  Surgeon: Samson Frederic, MD;  Location: [facility];  Service: Orthopedics;  Laterality: Right;   KNEE SURGERY     rt   Patient Active Problem List   Diagnosis Date Noted   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   Upper abdominal pain    Gastric foreign body    BV (bacterial vaginosis) 04/06/2021   Cervical high risk HPV (human papillomavirus) test positive 04/06/2021   Encounter for gynecological examination with Papanicolaou smear of cervix 03/31/2021   Encounter for screening fecal occult blood testing 03/31/2021   Screening mammogram for breast cancer 03/31/2021   Osteoarthritis of right knee 04/03/2019   Preventative health care 08/12/2018   Neck pain 04/29/2015   Degeneration of cervical intervertebral disc 09/24/2013   Pain in joint, shoulder region 09/24/2013   Muscle weakness (generalized) 09/24/2013   Decreased range of motion of neck 09/24/2013    PCP: Benita Stabile MD  REFERRING PROVIDER: Sharl Ma DPM  REFERRING DIAG:  M72.2 (ICD-10-CM) - Plantar fasciitis  M76.72 (ICD-10-CM) - Peroneal tendinitis of left lower extremity  M62.462 (ICD-10-CM) - Gastrocnemius equinus of left lower extremity    THERAPY DIAG:  Plantar fasciitis of left foot  Decreased range of motion of both ankles  Impaired functional mobility, balance, gait, and endurance  Rationale for Evaluation and Treatment: Rehabilitation  ONSET DATE: 4.5 years ago  SUBJECTIVE:   SUBJECTIVE STATEMENT:  Pt reporting that she has had a busy week with baby sitting and yardwork. No pain just mild stretching during bending over.   EVAL:Foot started bothering er 4.44yrs ago  and had a "hair fracture." MD put pt in boot. Pt reports "nail driving through foot" and was swelling throughout day. Potential hammer toe relation. Specialist wanted to pt in another boot but pt denied. March 1st of this year Left plantar fascial release. Pt has to wear boot at all times.   PERTINENT HISTORY: Osteoarthritis of right knee  Degeneration of cervical intervertebral disc  Pain in joint, shoulder region  Muscle weakness (generalized)  Decreased range of motion of neck  Neck pain  Preventative health care  More...  Encounter for gynecological examination with Papanicolaou smear of cervix  Encounter for screening fecal occult blood testing  Screening mammogram for breast cancer  BV (bacterial vaginosis)  Overview Signed 04/06/2021  2:53 PM by Adline Potter, NP   /329/23 rx flagyl    Cervical high risk HPV (human papillomavirus) test positive  Overview Signed 04/06/2021  2:56 PM by Adline Potter, NP   Repeat pap in 1 year per ASCCP guidelines 5 year risk for CIN3+ 4.8%    Upper abdominal pain  Gastric foreign body  Gastric erosion   PAIN:  Are you having pain? Yes: NPRS scale: 5/10 Pain location: Left foot and blister Pain description: "just hurts" Aggravating factors: lot of walking Relieving factors: Pain medications, very little ice  PRECAUTIONS: Other: WBAT in CAM boot  WEIGHT BEARING RESTRICTIONS: Yes WBAT  FALLS:  Has patient fallen in last 6 months? Yes. Number of falls 1, just caught up in rug.   LIVING ENVIRONMENT: Lives with: lives with their daughter Lives in: House/apartment Stairs: No Has following equipment at home: None  OCCUPATION: Retired  PLOF: Independent  PATIENT GOALS: "being able to wear tennis shoes back on"  NEXT MD VISIT: 04/06/2022  OBJECTIVE:   DIAGNOSTIC FINDINGS: IMPRESSION: 1. Moderate tendinosis of the peroneus longus and brevis tendons without tear. 2. Plantar fasciitis. No tear. 3. 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  PATIENT SURVEYS:  FOTO 64.5%  COGNITION: Overall cognitive status: Within functional limits for tasks assessed     SENSATION: WFL    POSTURE: rounded shoulders and forward head  PALPATION: No TTP  LOWER EXTREMITY ROM:  Active ROM Right eval Left eval Left 05/08/22  Hip flexion     Hip extension     Hip abduction     Hip adduction     Hip internal rotation     Hip external rotation     Knee flexion     Knee extension     Ankle dorsiflexion KS: -9; -5 KS:5;15 10  Ankle plantarflexion   35  Ankle inversion 35 35 38  Ankle eversion 2 20 10    (Blank rows = not tested)  LOWER EXTREMITY MMT:  MMT Right eval Left eval Left 05/08/22  Hip flexion     Hip extension     Hip abduction     Hip adduction  Hip internal rotation     Hip external rotation     Knee flexion     Knee extension 4+ 4- 5  Ankle dorsiflexion 4+ 4- 5  Ankle plantarflexion 3 3 4-  Ankle inversion 4+ 4+ 5  Ankle eversion 3+ 3+ 4+   (Blank rows = not tested)  LOWER EXTREMITY SPECIAL TESTS:    FUNCTIONAL TESTS:  5 times sit to stand: 13.6 seconds 2 minute walk test: 244ft 10 meter walk test: 9 seconds  GAIT: Distance walked: 253ft Assistive device utilized:  Cam boot Level of assistance: Complete Independence Comments: antalgic gait with LLE, mild bilateral trendelenberg, small LOB with ambulation, independent recovery.    TODAY'S TREATMENT:                                                                                                                              DATE:  05/17/2022  -Treadmill x 2' @ 1.2 speed- dynamic warmup -Ankle circle 50x bilaterally -BLE heel raises with LLE weight shift eccentric focus 3 x 10 -Tandem stance on blue foam pad 3 x 30' -Toe crunches 3 x 30' -Seated heel raise w/ 10lb DB 2 x 10 with 3 sec eccentric  05/10/22 Nustep seat 8 x 5' level 2 dynamic warm up  Standing: Heel/toe raises 2 x 10 Slant board 5 x 20" Squats  2 x 10 Tandem stance 2 x 30" BAPS board level 3 CW and CCW x 1' each Hip vectors 2" hold 2 x 5 each Alternating lunges 4" 2 x 10 each  05/08/22 Progress note FOTO 63 ( MD told her not to run or hop) 5 times sit to stand 7.53 sec no UE assist 2 MWT 365 ft SLS 6 sec on left today AROM and MMT's see above  04/05/22 PT Evaluation, HEP    PATIENT EDUCATION:  Education details: updated HEP Person educated: Patient Education method: Medical illustrator Education comprehension: verbalized understanding and returned demonstration  HOME EXERCISE PROGRAM: Access Code: [email or web address] URL: [email or web address] 04/24/22: SLS and tandem stance with intermittent HHA  04/21/22 -Standing heel and toe raises  04/12/2022 - Ankle Alphabet in Elevation  - 2 x daily - 7 x weekly - 1 sets - Seated Plantar Fascia Mobilization with Small Ball  - 1 x daily - 7 x weekly - Toe Yoga - Alternating Great Toe and Lesser Toe Extension  - 1 x daily - 7 x weekly - 20 reps - Seated Arch Lifts  - 1 x daily - 7 x weekly - 20 reps  Date: 04/05/2022 Prepared by: Starling Manns  Exercises - Ankle Eversion with Resistance  - 1 x daily - 7 x weekly - 3 sets - 10 reps - Ankle Inversion with Resistance  - 1 x daily - 7 x weekly - 3 sets - 10 reps - Ankle Dorsiflexion with Resistance  - 1 x daily - 7 x weekly - 3 sets - 10 reps - Towel Scrunches  -  1 x daily - 7 x weekly - 3 sets - 10 reps - Gastroc Stretch on Wall  - 1 x daily - 7 x weekly - 3 sets - 10 reps  ASSESSMENT:  CLINICAL IMPRESSION:  Pt tolerating session well, focus on isolation of L gastrocnemius with eccentric strengthening and toe intrinsic foot musculature. Continues with mild antalgic gait in part due to continued weakness. Improving in narrow BOS balance with improved tolerance and subjective report per patient.   Patient will benefit from continued skilled therapy services to address deficits and promote return to optimal  function.       OBJECTIVE IMPAIRMENTS: Abnormal gait, decreased activity tolerance, decreased endurance, decreased mobility, difficulty walking, decreased ROM, and decreased strength.   ACTIVITY LIMITATIONS: lifting, bending, standing, and locomotion level  PARTICIPATION LIMITATIONS: community activity, occupation, and yard work  PERSONAL FACTORS: Age and Past/current experiences are also affecting patient's functional outcome.   REHAB POTENTIAL: Good  CLINICAL DECISION MAKING: Stable/uncomplicated  EVALUATION COMPLEXITY: Low   GOALS: Goals reviewed with patient? No  SHORT TERM GOALS: Target date: 04/19/2022 Pt will be independent with HEP in order to demonstrate participation in Physical Therapy POC.  Baseline: See HEP section Goal status: MET  2.  Pt will report < 5/10 for pain in Left ankle to demonstrate improved functional activity tolerance Baseline: 5/10; 1/10 pain 05/08/22 Goal status: MET   LONG TERM GOALS: Target date: 05/06/2022  Pt will improve LLE ankle MMT > 4/5 in L to improve functional capacity tolerance. Baseline: see objective. Goal status: IN PROGRESS  2.  Pt will increase > 327ft in to demonstrate improve functional capacity and activity tolerance. Baseline: see objective; 05/08/22 365 ft Goal status: MET  3.  Pt will increased 5x STS to < 11 seconds to demonstrate increased functional LE strength.  Baseline: Assess next session. 05/08/22 7.53 sec Goal status: MET  4.  Pt will improve FOTO > 8 points to demonstrate improvements in overall functional output.  Baseline: see objective. 05/08/22 63 Goal status: IN PROGRESS  5. Patient will be able to stand on her left foot x 10 sec to increase functional balance  Baseline 05/08/22 6 sec  Goal status: in progress    PLAN:  PT FREQUENCY: 2x/week  PT DURATION: 4 weeks  PLANNED INTERVENTIONS: Therapeutic exercises, Therapeutic activity, Neuromuscular re-education, Balance training, Gait  training, Patient/Family education, Self Care, Joint mobilization, and Re-evaluation  PLAN FOR NEXT SESSION:  intrinsic foot strengthening, calf stretching. Balance and plantar flexion strength  9:45 AM, 05/17/22 Amy Small Lynch MPT [facility] [place] [phone] Ph:[phone]

## 2022-05-24 ENCOUNTER — Encounter (HOSPITAL_COMMUNITY): Payer: Self-pay

## 2022-05-24 ENCOUNTER — Ambulatory Visit (HOSPITAL_COMMUNITY): Payer: Medicare PPO

## 2022-05-24 DIAGNOSIS — M722 Plantar fascial fibromatosis: Secondary | ICD-10-CM | POA: Diagnosis not present

## 2022-05-24 DIAGNOSIS — Z7409 Other reduced mobility: Secondary | ICD-10-CM | POA: Diagnosis not present

## 2022-05-24 DIAGNOSIS — M25671 Stiffness of right ankle, not elsewhere classified: Secondary | ICD-10-CM | POA: Diagnosis not present

## 2022-05-24 DIAGNOSIS — M25672 Stiffness of left ankle, not elsewhere classified: Secondary | ICD-10-CM | POA: Diagnosis not present

## 2022-05-24 NOTE — Therapy (Signed)
OUTPATIENT PHYSICAL THERAPY LOWER EXTREMITY TREATMENT      Patient Name: Linda Page MRN: [id number] DOB:September 15, 1950, 72 y.o., female Today's Date: 05/24/2022  END OF SESSION:   PT End of Session - 05/24/22 0911     Visit Number 10    Number of Visits 12    Date for PT Re-Evaluation 06/05/22    Authorization Type [employer]    Authorization Time Period 1 re-eval and 4 visits from 05/08/22 to 06/05/22    Authorization - Visit Number 3    Authorization - Number of Visits 4    Progress Note Due on Visit 4    PT Start Time 0905    PT Stop Time 0943    PT Time Calculation (min) 38 min    Activity Tolerance Patient tolerated treatment well    Behavior During Therapy [facility] for tasks assessed/performed                 Past Medical History:  Diagnosis Date   Anxiety    Arthritis    in neck, in knee has gel shots givin in knee once a week for 3 weeks   BV (bacterial vaginosis) 04/06/2021   /329/23 rx flagyl   Cervical high risk HPV (human papillomavirus) test positive 04/06/2021   Repeat pap in 1 year per ASCCP guidelines 5 year risk for CIN3+ 4.8%   Depression    Hypertension    Past Surgical History:  Procedure Laterality Date   ANTERIOR CERVICAL DECOMP/DISCECTOMY FUSION N/A 04/29/2015   Procedure: ACDF C4-7;  Surgeon: Venita Lick, MD;  Location: [facility];  Service: Orthopedics;  Laterality: N/A;   BIOPSY  05/16/2021   Procedure: BIOPSY;  Surgeon: Lanelle Bal, DO;  [facility];  Service: Endoscopy;;  gastric erosion   cataract surgery     bilateral   COLONOSCOPY WITH PROPOFOL N/A 11/05/2018   Procedure: COLONOSCOPY WITH PROPOFOL;  Surgeon: West Bali, MD;  [facility];  Service: Endoscopy;  Laterality: N/A;  10:30am   ESOPHAGOGASTRODUODENOSCOPY (EGD) WITH PROPOFOL N/A 05/16/2021   Procedure: ESOPHAGOGASTRODUODENOSCOPY (EGD) WITH PROPOFOL;  Surgeon: Lanelle Bal, DO;  [facility];  Service: Endoscopy;  Laterality: N/A;    ESOPHAGOGASTRODUODENOSCOPY (EGD) WITH PROPOFOL N/A 05/15/2021   Procedure: ESOPHAGOGASTRODUODENOSCOPY (EGD) WITH PROPOFOL;  Surgeon: Dolores Frame, MD;  [facility];  Service: Gastroenterology;  Laterality: N/A;   HAND SURGERY     rt   KNEE ARTHROPLASTY Right 04/03/2019   Procedure: COMPUTER ASSISTED TOTAL KNEE ARTHROPLASTY;  Surgeon: Samson Frederic, MD;  Location: [facility];  Service: Orthopedics;  Laterality: Right;   KNEE SURGERY     rt   Patient Active Problem List   Diagnosis Date Noted   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   Upper abdominal pain    Gastric foreign body    BV (bacterial vaginosis) 04/06/2021   Cervical high risk HPV (human papillomavirus) test positive 04/06/2021   Encounter for gynecological examination with Papanicolaou smear of cervix 03/31/2021   Encounter for screening fecal occult blood testing 03/31/2021   Screening mammogram for breast cancer 03/31/2021   Osteoarthritis of right knee 04/03/2019   Preventative health care 08/12/2018   Neck pain 04/29/2015   Degeneration of cervical intervertebral disc 09/24/2013   Pain in joint, shoulder region 09/24/2013   Muscle weakness (generalized) 09/24/2013   Decreased range of motion of neck 09/24/2013    PCP: Benita Stabile MD  REFERRING PROVIDER: Sharl Ma DPM  REFERRING  DIAG:  M72.2 (ICD-10-CM) - Plantar fasciitis  M76.72 (ICD-10-CM) - Peroneal tendinitis of left lower extremity  M62.462 (ICD-10-CM) - Gastrocnemius equinus of left lower extremity    THERAPY DIAG:  Plantar fasciitis of left foot  Decreased range of motion of both ankles  Impaired functional mobility, balance, gait, and endurance  Rationale for Evaluation and Treatment: Rehabilitation  ONSET DATE: 4.5 years ago  SUBJECTIVE:   SUBJECTIVE STATEMENT:  No reports of pain.  Feels she is making good gains with therapy and ability to complete yardwork and household errands without pain. Reports she has purchased shoes  that are half size bigger and wearing additional lift in Rt LE for LLD.  EVAL:Foot started bothering er 4.46yrs ago and had a "hair fracture." MD put pt in boot. Pt reports "nail driving through foot" and was swelling throughout day. Potential hammer toe relation. Specialist wanted to pt in another boot but pt denied. March 1st of this year Left plantar fascial release. Pt has to wear boot at all times.   PERTINENT HISTORY: Osteoarthritis of right knee  Degeneration of cervical intervertebral disc  Pain in joint, shoulder region  Muscle weakness (generalized)  Decreased range of motion of neck  Neck pain  Preventative health care  More...  Encounter for gynecological examination with Papanicolaou smear of cervix  Encounter for screening fecal occult blood testing  Screening mammogram for breast cancer  BV (bacterial vaginosis)  Overview Signed 04/06/2021  2:53 PM by Adline Potter, NP   /329/23 rx flagyl    Cervical high risk HPV (human papillomavirus) test positive  Overview Signed 04/06/2021  2:56 PM by Adline Potter, NP   Repeat pap in 1 year per ASCCP guidelines 5 year risk for CIN3+ 4.8%    Upper abdominal pain  Gastric foreign body  Gastric erosion   PAIN:  Are you having pain? Yes: NPRS scale: 5/10 Pain location: Left foot and blister Pain description: "just hurts" Aggravating factors: lot of walking Relieving factors: Pain medications, very little ice  PRECAUTIONS: Other: WBAT in CAM boot  WEIGHT BEARING RESTRICTIONS: Yes WBAT  FALLS:  Has patient fallen in last 6 months? Yes. Number of falls 1, just caught up in rug.   LIVING ENVIRONMENT: Lives with: lives with their daughter Lives in: House/apartment Stairs: No Has following equipment at home: None  OCCUPATION: Retired  PLOF: Independent  PATIENT GOALS: "being able to wear tennis shoes back on"  NEXT MD VISIT: 04/06/2022  OBJECTIVE:   DIAGNOSTIC FINDINGS: IMPRESSION: 1. Moderate tendinosis  of the peroneus longus and brevis tendons without tear. 2. Plantar fasciitis. No tear. 3. 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  PATIENT SURVEYS:  FOTO 64.5%  COGNITION: Overall cognitive status: Within functional limits for tasks assessed     SENSATION: WFL    POSTURE: rounded shoulders and forward head  PALPATION: No TTP  LOWER EXTREMITY ROM:  Active ROM Right eval Left eval Left 05/08/22  Hip flexion     Hip extension     Hip abduction     Hip adduction     Hip internal rotation     Hip external rotation     Knee flexion     Knee extension     Ankle dorsiflexion KS: -9; -5 KS:5;15 10  Ankle plantarflexion   35  Ankle inversion 35 35 38  Ankle eversion 2 20 10    (Blank rows = not tested)  LOWER EXTREMITY MMT:  MMT Right eval Left eval Left 05/08/22  Hip flexion     Hip extension     Hip abduction     Hip adduction     Hip internal rotation     Hip external rotation     Knee flexion     Knee extension 4+ 4- 5  Ankle dorsiflexion 4+ 4- 5  Ankle plantarflexion 3 3 4-  Ankle inversion 4+ 4+ 5  Ankle eversion 3+ 3+ 4+   (Blank rows = not tested)  LOWER EXTREMITY SPECIAL TESTS:    FUNCTIONAL TESTS:  5 times sit to stand: 13.6 seconds 2 minute walk test: 297ft 10 meter walk test: 9 seconds  GAIT: Distance walked: 262ft Assistive device utilized:  Cam boot Level of assistance: Complete Independence Comments: antalgic gait with LLE, mild bilateral trendelenberg, small LOB with ambulation, independent recovery.    TODAY'S TREATMENT:                                                                                                                              DATE:  05/25/22: -BLE heel raises with LLE weight shift eccentric focus 3 x 10 -Toe raises 2x 10 -Plantar fascia stretch 2x 30" -Squat 2x 10 -Vector stance 3x 5" - SLS Rt 7", Lt 4" -Tandem stance 2x 30" intermittent HHA  05/17/2022  -Treadmill x 2' @ 1.2 speed- dynamic  warmup -Ankle circle 50x bilaterally -BLE heel raises with LLE weight shift eccentric focus 3 x 10 -Tandem stance on blue foam pad 3 x 30' -Toe crunches 3 x 30' -Seated heel raise w/ 10lb DB 2 x 10 with 3 sec eccentric  05/10/22 Nustep seat 8 x 5' level 2 dynamic warm up  Standing: Heel/toe raises 2 x 10 Slant board 5 x 20" Squats 2 x 10 Tandem stance 2 x 30" BAPS board level 3 CW and CCW x 1' each Hip vectors 2" hold 2 x 5 each Alternating lunges 4" 2 x 10 each  05/08/22 Progress note FOTO 63 ( MD told her not to run or hop) 5 times sit to stand 7.53 sec no UE assist 2 MWT 365 ft SLS 6 sec on left today AROM and MMT's see above  04/05/22 PT Evaluation, HEP    PATIENT EDUCATION:  Education details: updated HEP Person educated: Patient Education method: Medical illustrator Education comprehension: verbalized understanding and returned demonstration  HOME EXERCISE PROGRAM: Access Code: [email or web address] URL: [email or web address] 04/24/22: SLS and tandem stance with intermittent HHA  04/21/22 -Standing heel and toe raises  04/12/2022 - Ankle Alphabet in Elevation  - 2 x daily - 7 x weekly - 1 sets - Seated Plantar Fascia Mobilization with Small Ball  - 1 x daily - 7 x weekly - Toe Yoga - Alternating Great Toe and Lesser Toe Extension  - 1 x daily - 7 x weekly - 20 reps - Seated Arch Lifts  - 1 x daily - 7 x weekly - 20 reps  Date: 04/05/2022 Prepared by: Enid Derry  [place]  Exercises - Ankle Eversion with Resistance  - 1 x daily - 7 x weekly - 3 sets - 10 reps - Ankle Inversion with Resistance  - 1 x daily - 7 x weekly - 3 sets - 10 reps - Ankle Dorsiflexion with Resistance  - 1 x daily - 7 x weekly - 3 sets - 10 reps - Towel Scrunches  - 1 x daily - 7 x weekly - 3 sets - 10 reps - Gastroc Stretch on Wall  - 1 x daily - 7 x weekly - 3 sets - 10 reps  ASSESSMENT:  CLINICAL IMPRESSION: Session focus with ankle strengthening, mobility and balance.  Pt  continues to have difficulty with SLS and NBOS/dynamic surface required stepping out of BOS or HHA.  Hip stability exercises complete to assist with balance.  Pt able to complete whole session with no reports of pain, did report some discomfort during plantar fascia stretch though resolved following stretch.      OBJECTIVE IMPAIRMENTS: Abnormal gait, decreased activity tolerance, decreased endurance, decreased mobility, difficulty walking, decreased ROM, and decreased strength.   ACTIVITY LIMITATIONS: lifting, bending, standing, and locomotion level  PARTICIPATION LIMITATIONS: community activity, occupation, and yard work  PERSONAL FACTORS: Age and Past/current experiences are also affecting patient's functional outcome.   REHAB POTENTIAL: Good  CLINICAL DECISION MAKING: Stable/uncomplicated  EVALUATION COMPLEXITY: Low   GOALS: Goals reviewed with patient? No  SHORT TERM GOALS: Target date: 04/19/2022 Pt will be independent with HEP in order to demonstrate participation in Physical Therapy POC.  Baseline: See HEP section Goal status: MET  2.  Pt will report < 5/10 for pain in Left ankle to demonstrate improved functional activity tolerance Baseline: 5/10; 1/10 pain 05/08/22 Goal status: MET   LONG TERM GOALS: Target date: 05/06/2022  Pt will improve LLE ankle MMT > 4/5 in L to improve functional capacity tolerance. Baseline: see objective. Goal status: IN PROGRESS  2.  Pt will increase > 359ft in to demonstrate improve functional capacity and activity tolerance. Baseline: see objective; 05/08/22 365 ft Goal status: MET  3.  Pt will increased 5x STS to < 11 seconds to demonstrate increased functional LE strength.  Baseline: Assess next session. 05/08/22 7.53 sec Goal status: MET  4.  Pt will improve FOTO > 8 points to demonstrate improvements in overall functional output.  Baseline: see objective. 05/08/22 63 Goal status: IN PROGRESS  5. Patient will be able to stand on  her left foot x 10 sec to increase functional balance  Baseline 05/08/22 6 sec  Goal status: in progress    PLAN:  PT FREQUENCY: 2x/week  PT DURATION: 4 weeks  PLANNED INTERVENTIONS: Therapeutic exercises, Therapeutic activity, Neuromuscular re-education, Balance training, Gait training, Patient/Family education, Self Care, Joint mobilization, and Re-evaluation  PLAN FOR NEXT SESSION:  intrinsic foot strengthening, calf stretching. Balance and plantar flexion strength  Becky Sax, LPTA/CLT; CBIS [phone]  9:55 AM, 05/24/22

## 2022-05-31 ENCOUNTER — Ambulatory Visit (HOSPITAL_COMMUNITY): Payer: Medicare PPO

## 2022-05-31 DIAGNOSIS — M722 Plantar fascial fibromatosis: Secondary | ICD-10-CM

## 2022-05-31 DIAGNOSIS — Z7409 Other reduced mobility: Secondary | ICD-10-CM

## 2022-05-31 DIAGNOSIS — M25672 Stiffness of left ankle, not elsewhere classified: Secondary | ICD-10-CM | POA: Diagnosis not present

## 2022-05-31 DIAGNOSIS — M25671 Stiffness of right ankle, not elsewhere classified: Secondary | ICD-10-CM | POA: Diagnosis not present

## 2022-05-31 NOTE — Therapy (Signed)
OUTPATIENT PHYSICAL THERAPY LOWER EXTREMITY TREATMENT PHYSICAL THERAPY DISCHARGE SUMMARY  Visits from Start of Care: 11  Current functional level related to goals / functional outcomes: See below   Remaining deficits: See below   Education / Equipment: See below   Patient agrees to discharge. Patient goals were met. Patient is being discharged due to meeting the stated rehab goals.       Patient Name: Linda Page MRN: [id number] DOB:1950-03-21, 72 y.o., female Today's Date: 05/31/2022  END OF SESSION:   PT End of Session - 05/31/22 0906     Visit Number 11    Number of Visits 12    Date for PT Re-Evaluation 06/05/22    Authorization Type [employer]    Authorization Time Period 1 re-eval and 4 visits from 05/08/22 to 06/05/22    Authorization - Visit Number 4    Authorization - Number of Visits 4    Progress Note Due on Visit 4    PT Start Time 0902    PT Stop Time 0926    PT Time Calculation (min) 24 min    Activity Tolerance Patient tolerated treatment well    Behavior During Therapy [facility] for tasks assessed/performed                 Past Medical History:  Diagnosis Date   Anxiety    Arthritis    in neck, in knee has gel shots givin in knee once a week for 3 weeks   BV (bacterial vaginosis) 04/06/2021   /329/23 rx flagyl   Cervical high risk HPV (human papillomavirus) test positive 04/06/2021   Repeat pap in 1 year per ASCCP guidelines 5 year risk for CIN3+ 4.8%   Depression    Hypertension    Past Surgical History:  Procedure Laterality Date   ANTERIOR CERVICAL DECOMP/DISCECTOMY FUSION N/A 04/29/2015   Procedure: ACDF C4-7;  Surgeon: Venita Lick, MD;  Location: [facility] OR;  Service: Orthopedics;  Laterality: N/A;   BIOPSY  05/16/2021   Procedure: BIOPSY;  Surgeon: Lanelle Bal, DO;  Location: [facility];  Service: Endoscopy;;  gastric erosion   cataract surgery     bilateral   COLONOSCOPY WITH PROPOFOL N/A 11/05/2018   Procedure:  COLONOSCOPY WITH PROPOFOL;  Surgeon: West Bali, MD;  Location: [facility];  Service: Endoscopy;  Laterality: N/A;  10:30am   ESOPHAGOGASTRODUODENOSCOPY (EGD) WITH PROPOFOL N/A 05/16/2021   Procedure: ESOPHAGOGASTRODUODENOSCOPY (EGD) WITH PROPOFOL;  Surgeon: Lanelle Bal, DO;  Location: [facility];  Service: Endoscopy;  Laterality: N/A;   ESOPHAGOGASTRODUODENOSCOPY (EGD) WITH PROPOFOL N/A 05/15/2021   Procedure: ESOPHAGOGASTRODUODENOSCOPY (EGD) WITH PROPOFOL;  Surgeon: Dolores Frame, MD;  Location: [facility];  Service: Gastroenterology;  Laterality: N/A;   HAND SURGERY     rt   KNEE ARTHROPLASTY Right 04/03/2019   Procedure: COMPUTER ASSISTED TOTAL KNEE ARTHROPLASTY;  Surgeon: Samson Frederic, MD;  Location: [facility] OR[facility];  Service: Orthopedics;  Laterality: Right;   KNEE SURGERY     rt   Patient Active Problem List   Diagnosis Date Noted   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   Upper abdominal pain    Gastric foreign body    BV (bacterial vaginosis) 04/06/2021   Cervical high risk HPV (human papillomavirus) test positive 04/06/2021   Encounter for gynecological examination with Papanicolaou smear of cervix 03/31/2021   Encounter for screening fecal occult blood testing 03/31/2021   Screening mammogram for breast cancer 03/31/2021   Osteoarthritis of right knee 04/03/2019  Preventative health care 08/12/2018   Neck pain 04/29/2015   Degeneration of cervical intervertebral disc 09/24/2013   Pain in joint, shoulder region 09/24/2013   Muscle weakness (generalized) 09/24/2013   Decreased range of motion of neck 09/24/2013    PCP: Benita Stabile MD  REFERRING PROVIDER: Sharl Ma DPM  REFERRING DIAG:  M72.2 (ICD-10-CM) - Plantar fasciitis  M76.72 (ICD-10-CM) - Peroneal tendinitis of left lower extremity  M62.462 (ICD-10-CM) - Gastrocnemius equinus of left lower extremity    THERAPY DIAG:  Plantar fasciitis of left foot  Decreased range of motion of both  ankles  Impaired functional mobility, balance, gait, and endurance  Rationale for Evaluation and Treatment: Rehabilitation  ONSET DATE: 4.5 years ago  SUBJECTIVE:   SUBJECTIVE STATEMENT:  No reports of pain.  " 95% better"; reports she has returned to doing yard work; [employer], etc.    EVAL:Foot started bothering er 4.40yrs ago and had a "hair fracture." MD put pt in boot. Pt reports "nail driving through foot" and was swelling throughout day. Potential hammer toe relation. Specialist wanted to pt in another boot but pt denied. March 1st of this year Left plantar fascial release. Pt has to wear boot at all times.   PERTINENT HISTORY: Osteoarthritis of right knee  Degeneration of cervical intervertebral disc  Pain in joint, shoulder region  Muscle weakness (generalized)  Decreased range of motion of neck  Neck pain  Preventative health care  More...  Encounter for gynecological examination with Papanicolaou smear of cervix  Encounter for screening fecal occult blood testing  Screening mammogram for breast cancer  BV (bacterial vaginosis)  Overview Signed 04/06/2021  2:53 PM by Adline Potter, NP   /329/23 rx flagyl    Cervical high risk HPV (human papillomavirus) test positive  Overview Signed 04/06/2021  2:56 PM by Adline Potter, NP   Repeat pap in 1 year per ASCCP guidelines 5 year risk for CIN3+ 4.8%    Upper abdominal pain  Gastric foreign body  Gastric erosion   PAIN:  Are you having pain? Yes: NPRS scale: 5/10 Pain location: Left foot and blister Pain description: "just hurts" Aggravating factors: lot of walking Relieving factors: Pain medications, very little ice  PRECAUTIONS: Other: WBAT in CAM boot  WEIGHT BEARING RESTRICTIONS: Yes WBAT  FALLS:  Has patient fallen in last 6 months? Yes. Number of falls 1, just caught up in rug.   LIVING ENVIRONMENT: Lives with: lives with their daughter Lives in: House/apartment Stairs: No Has following  equipment at home: None  OCCUPATION: Retired  PLOF: Independent  PATIENT GOALS: "being able to wear tennis shoes back on"  NEXT MD VISIT: 04/06/2022  OBJECTIVE:   DIAGNOSTIC FINDINGS: IMPRESSION: 1. Moderate tendinosis of the peroneus longus and brevis tendons without tear. 2. Plantar fasciitis. No tear. 3. 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  PATIENT SURVEYS:  FOTO 64.5%  COGNITION: Overall cognitive status: Within functional limits for tasks assessed     SENSATION: WFL    POSTURE: rounded shoulders and forward head  PALPATION: No TTP  LOWER EXTREMITY ROM:  Active ROM Right eval Left eval Left 05/08/22  Hip flexion     Hip extension     Hip abduction     Hip adduction     Hip internal rotation     Hip external rotation     Knee flexion     Knee extension     Ankle dorsiflexion KS: -9; -5 KS:5;15 10  Ankle  plantarflexion   35  Ankle inversion 35 35 38  Ankle eversion 2 20 10    (Blank rows = not tested)  LOWER EXTREMITY MMT:  MMT Right eval Left eval Left 05/08/22 Left 05/31/22  Hip flexion      Hip extension      Hip abduction      Hip adduction      Hip internal rotation      Hip external rotation      Knee flexion      Knee extension 4+ 4- 5 5  Ankle dorsiflexion 4+ 4- 5 5  Ankle plantarflexion 3 3 4- 4+  Ankle inversion 4+ 4+ 5 5  Ankle eversion 3+ 3+ 4+ 5   (Blank rows = not tested)  LOWER EXTREMITY SPECIAL TESTS:    FUNCTIONAL TESTS:  5 times sit to stand: 13.6 seconds 2 minute walk test: 21ft 10 meter walk test: 9 seconds  GAIT: Distance walked: 258ft Assistive device utilized:  Cam boot Level of assistance: Complete Independence Comments: antalgic gait with LLE, mild bilateral trendelenberg, small LOB with ambulation, independent recovery.    TODAY'S TREATMENT:                                                                                                                              DATE:   05/31/22 Progress note FOTO 82 5 times sit to stand 6.24 sec 2 MWT 392 ft  SLS left foot 10" MMT's see above    05/25/22: -BLE heel raises with LLE weight shift eccentric focus 3 x 10 -Toe raises 2x 10 -Plantar fascia stretch 2x 30" -Squat 2x 10 -Vector stance 3x 5" - SLS Rt 7", Lt 4" -Tandem stance 2x 30" intermittent HHA  05/17/2022  -Treadmill x 2' @ 1.2 speed- dynamic warmup -Ankle circle 50x bilaterally -BLE heel raises with LLE weight shift eccentric focus 3 x 10 -Tandem stance on blue foam pad 3 x 30' -Toe crunches 3 x 30' -Seated heel raise w/ 10lb DB 2 x 10 with 3 sec eccentric  05/10/22 Nustep seat 8 x 5' level 2 dynamic warm up  Standing: Heel/toe raises 2 x 10 Slant board 5 x 20" Squats 2 x 10 Tandem stance 2 x 30" BAPS board level 3 CW and CCW x 1' each Hip vectors 2" hold 2 x 5 each Alternating lunges 4" 2 x 10 each  05/08/22 Progress note FOTO 63 ( MD told her not to run or hop) 5 times sit to stand 7.53 sec no UE assist 2 MWT 365 ft SLS 6 sec on left today AROM and MMT's see above  04/05/22 PT Evaluation, HEP    PATIENT EDUCATION:  Education details: updated HEP Person educated: Patient Education method: Medical illustrator Education comprehension: verbalized understanding and returned demonstration  HOME EXERCISE PROGRAM: Access Code: [email or web address] URL: [email or web address] 04/24/22: SLS and tandem stance with intermittent HHA  04/21/22 -Standing heel and toe raises  04/12/2022 - Ankle  Alphabet in Elevation  - 2 x daily - 7 x weekly - 1 sets - Seated Plantar Fascia Mobilization with Small Ball  - 1 x daily - 7 x weekly - Toe Yoga - Alternating Great Toe and Lesser Toe Extension  - 1 x daily - 7 x weekly - 20 reps - Seated Arch Lifts  - 1 x daily - 7 x weekly - 20 reps  Date: 04/05/2022 Prepared by: Starling Manns  Exercises - Ankle Eversion with Resistance  - 1 x daily - 7 x weekly - 3 sets - 10 reps - Ankle  Inversion with Resistance  - 1 x daily - 7 x weekly - 3 sets - 10 reps - Ankle Dorsiflexion with Resistance  - 1 x daily - 7 x weekly - 3 sets - 10 reps - Towel Scrunches  - 1 x daily - 7 x weekly - 3 sets - 10 reps - Gastroc Stretch on Wall  - 1 x daily - 7 x weekly - 3 sets - 10 reps  ASSESSMENT:  CLINICAL IMPRESSION: Progress note today; patient has achieved all set rehab goals and is agreeable to discharge at this time.    OBJECTIVE IMPAIRMENTS: Abnormal gait, decreased activity tolerance, decreased endurance, decreased mobility, difficulty walking, decreased ROM, and decreased strength.   ACTIVITY LIMITATIONS: lifting, bending, standing, and locomotion level  PARTICIPATION LIMITATIONS: community activity, occupation, and yard work  PERSONAL FACTORS: Age and Past/current experiences are also affecting patient's functional outcome.   REHAB POTENTIAL: Good  CLINICAL DECISION MAKING: Stable/uncomplicated  EVALUATION COMPLEXITY: Low   GOALS: Goals reviewed with patient? No  SHORT TERM GOALS: Target date: 04/19/2022 Pt will be independent with HEP in order to demonstrate participation in Physical Therapy POC.  Baseline: See HEP section Goal status: MET  2.  Pt will report < 5/10 for pain in Left ankle to demonstrate improved functional activity tolerance Baseline: 5/10; 1/10 pain 05/08/22 Goal status: MET   LONG TERM GOALS: Target date: 05/06/2022  Pt will improve LLE ankle MMT > 4/5 in L to improve functional capacity tolerance. Baseline: see objective. Goal status: MET  2.  Pt will increase > 32ft in to demonstrate improve functional capacity and activity tolerance. Baseline: see objective; 05/08/22 365 ft; 05/31/22 392 ft Goal status: MET  3.  Pt will increased 5x STS to < 11 seconds to demonstrate increased functional LE strength.  Baseline: Assess next session. 05/08/22 7.53 sec; 6.24 sec 05/31/22 Goal status: MET  4.  Pt will improve FOTO > 8 points to  demonstrate improvements in overall functional output.  Baseline: see objective. 05/08/22 63 Goal status: MET  5. Patient will be able to stand on her left foot x 10 sec to increase functional balance  Baseline 05/08/22 6 sec; 05/31/22 10 sec  Goal status: met    PLAN:  PT FREQUENCY: 2x/week  PT DURATION: 4 weeks  PLANNED INTERVENTIONS: Therapeutic exercises, Therapeutic activity, Neuromuscular re-education, Balance training, Gait training, Patient/Family education, Self Care, Joint mobilization, and Re-evaluation  PLAN FOR NEXT SESSION:  discharge 9:27 AM, 05/31/22 Jaxson Anglin Small Josede Cicero MPT [facility] [place] [phone] Ph:[phone]

## 2022-06-01 ENCOUNTER — Ambulatory Visit (INDEPENDENT_AMBULATORY_CARE_PROVIDER_SITE_OTHER): Payer: Medicare PPO | Admitting: Podiatry

## 2022-06-01 DIAGNOSIS — M722 Plantar fascial fibromatosis: Secondary | ICD-10-CM

## 2022-06-01 DIAGNOSIS — M62462 Contracture of muscle, left lower leg: Secondary | ICD-10-CM

## 2022-06-01 DIAGNOSIS — M7672 Peroneal tendinitis, left leg: Secondary | ICD-10-CM

## 2022-06-05 NOTE — Progress Notes (Signed)
  Subjective:  Patient ID: RAPHAELLA HASKIN, female    DOB: [birth year],  MRN: [id number]  Chief Complaint  Patient presents with   Routine Post Op    RM 24 POV #4 DOS 03/10/2022 LT EPF, CALF MUSCLE LENGTHENING, PRP INJECTION. Pt state she is doing well. Finished PT yesterday. Only concern is the tingling sensations at the heel and medial side of her left foot.      72 y.o. female returns for post-op check.  Still doing well having very little pain occasional tenderness that is random review of Systems: Negative except as noted in the HPI. Denies N/V/F/Ch.   Objective:  There were no vitals filed for this visit. There is no height or weight on file to calculate BMI. Constitutional Well developed. Well nourished.  Vascular Foot warm and well perfused. Capillary refill normal to all digits.  Calf is soft and supple, no posterior calf or knee pain, negative Homans' sign  Neurologic Normal speech. Oriented to person, place, and time. Epicritic sensation to light touch grossly present bilaterally.  Dermatologic Incision well-healed not hypertrophic  Orthopedic: No pain to palpation today.  Good range of motion    Assessment:   1. Plantar fasciitis   2. Gastrocnemius equinus of left lower extremity   3. Peroneal tendinitis of left lower extremity     Plan:  Patient was evaluated and treated and all questions answered.  S/p foot surgery left She is doing very well.  May return to full activity and continue with normal shoe gear as tolerated.  We discussed the risk of recurrence.  Discussed some strengthening and conditioning may take a few more months to get to full strength regarding the gastrocnemius recession.  I will see her back as needed I have no restrictions for her at this point.  Return if symptoms worsen or fail to improve.

## 2022-06-07 ENCOUNTER — Other Ambulatory Visit (HOSPITAL_COMMUNITY): Payer: Self-pay | Admitting: Adult Health

## 2022-06-07 DIAGNOSIS — N6310 Unspecified lump in the right breast, unspecified quadrant: Secondary | ICD-10-CM

## 2022-06-08 ENCOUNTER — Other Ambulatory Visit (HOSPITAL_COMMUNITY): Payer: Self-pay | Admitting: Adult Health

## 2022-06-08 DIAGNOSIS — N6310 Unspecified lump in the right breast, unspecified quadrant: Secondary | ICD-10-CM

## 2022-06-13 ENCOUNTER — Encounter (HOSPITAL_COMMUNITY): Payer: Self-pay

## 2022-06-13 ENCOUNTER — Ambulatory Visit (HOSPITAL_COMMUNITY)
Admission: RE | Admit: 2022-06-13 | Discharge: 2022-06-13 | Disposition: A | Discharge status: Home / Self Care | Payer: Medicare PPO | Source: Ambulatory Visit | Attending: Adult Health | Admitting: Adult Health

## 2022-06-13 DIAGNOSIS — N6310 Unspecified lump in the right breast, unspecified quadrant: Secondary | ICD-10-CM

## 2022-06-13 DIAGNOSIS — R92333 Mammographic heterogeneous density, bilateral breasts: Secondary | ICD-10-CM | POA: Diagnosis not present

## 2022-06-13 DIAGNOSIS — Z1239 Encounter for other screening for malignant neoplasm of breast: Secondary | ICD-10-CM | POA: Diagnosis not present

## 2022-06-28 DIAGNOSIS — L728 Other follicular cysts of the skin and subcutaneous tissue: Secondary | ICD-10-CM | POA: Diagnosis not present

## 2022-06-28 DIAGNOSIS — L820 Inflamed seborrheic keratosis: Secondary | ICD-10-CM | POA: Diagnosis not present

## 2022-07-04 ENCOUNTER — Ambulatory Visit: Payer: Medicare PPO | Admitting: Adult Health

## 2022-08-19 DIAGNOSIS — M1712 Unilateral primary osteoarthritis, left knee: Secondary | ICD-10-CM | POA: Diagnosis not present

## 2022-08-22 ENCOUNTER — Other Ambulatory Visit (HOSPITAL_COMMUNITY)
Admission: RE | Admit: 2022-08-22 | Discharge: 2022-08-22 | Disposition: A | Discharge status: Home / Self Care | Payer: Medicare PPO | Source: Ambulatory Visit | Attending: Adult Health | Admitting: Adult Health

## 2022-08-22 ENCOUNTER — Ambulatory Visit (INDEPENDENT_AMBULATORY_CARE_PROVIDER_SITE_OTHER): Payer: Medicare PPO | Admitting: Adult Health

## 2022-08-22 ENCOUNTER — Encounter: Payer: Self-pay | Admitting: Adult Health

## 2022-08-22 VITALS — BP 157/88 | HR 84 | Ht 64.0 in | Wt 230.0 lb

## 2022-08-22 DIAGNOSIS — Z01419 Encounter for gynecological examination (general) (routine) without abnormal findings: Secondary | ICD-10-CM | POA: Insufficient documentation

## 2022-08-22 DIAGNOSIS — I10 Essential (primary) hypertension: Secondary | ICD-10-CM | POA: Insufficient documentation

## 2022-08-22 DIAGNOSIS — R87810 Cervical high risk human papillomavirus (HPV) DNA test positive: Secondary | ICD-10-CM | POA: Diagnosis not present

## 2022-08-22 DIAGNOSIS — Z1211 Encounter for screening for malignant neoplasm of colon: Secondary | ICD-10-CM | POA: Diagnosis not present

## 2022-08-22 DIAGNOSIS — Z1151 Encounter for screening for human papillomavirus (HPV): Secondary | ICD-10-CM | POA: Diagnosis not present

## 2022-08-22 LAB — HEMOCCULT GUIAC POC 1CARD (OFFICE): Fecal Occult Blood, POC: NEGATIVE

## 2022-08-22 NOTE — Progress Notes (Signed)
Patient ID: Linda Page, female   DOB: Apr 27, 1950, 72 y.o.   MRN: [id number] History of Present Illness: Linda Page is a 72 year old white female, widowed, PM in for a well woman gyn exam and pap. Pap last year +HR HPV.  PCP is Dr Margo Aye.   Current Medications, Allergies, Past Medical History, Past Surgical History, Family History and Social History were reviewed in [employer] record.     Review of Systems: Patient denies any headaches, hearing loss, fatigue, blurred vision, shortness of breath, chest pain, abdominal pain, problems with bowel movements, urination, or intercourse(not active). No joint pain or mood swings.  Denies any vaginal bleeding  She had steroid injection left knee Saturday    Physical Exam:. BP (!) 157/88 (BP Location: Left Arm, Patient Position: Sitting, Cuff Size: Normal)   Pulse 84   Ht 5\' 4"  (1.626 m)   Wt 230 lb (104.3 kg)   BMI 39.48 kg/m   General:  Well developed, well nourished, no acute distress Skin:  Warm and dry Neck:  Midline trachea, normal thyroid, good ROM, no lymphadenopathy,no carotid bruits heard  Lungs; Clear to auscultation bilaterally Breast:  No dominant palpable mass, retraction, or nipple discharge Cardiovascular: Regular rate and rhythm Abdomen:  Soft, non tender, no hepatosplenomegaly Pelvic:  External genitalia is normal in appearance, has multiple skin tags, inner thighs, L>R.   The vagina is pale. Urethra has no lesions or masses. The cervix is smooth, pap with HR HPV genotyping performed.  Uterus is felt to be normal size, shape, and contour.  No adnexal masses or tenderness noted.Bladder is non tender, no masses felt. Rectal: Good sphincter tone, no polyps, or hemorrhoids felt.  Hemoccult negative. Extremities/musculoskeletal:  No swelling or varicosities noted, no clubbing or cyanosis Psych:  No mood changes, alert and cooperative,seems happy AA is 0 Fall risk is moderate    08/22/2022   11:53 AM  03/31/2021   10:25 AM  Depression screen PHQ 2/9  Decreased Interest 0 0  Down, Depressed, Hopeless 0 0  PHQ - 2 Score 0 0  Altered sleeping 0 0  Tired, decreased energy 0 0  Change in appetite 0 0  Feeling bad or failure about yourself  0 0  Trouble concentrating 0 0  Moving slowly or fidgety/restless 0 0  Suicidal thoughts 0 0  PHQ-9 Score 0 0       08/22/2022   11:53 AM 03/31/2021   10:25 AM  GAD 7 : Generalized Anxiety Score  Nervous, Anxious, on Edge 0 0  Control/stop worrying 0 0  Worry too much - different things 0 0  Trouble relaxing 0 0  Restless 0 0  Easily annoyed or irritable 0 0  Afraid - awful might happen 0 0  Total GAD 7 Score 0 0    Upstream - 08/22/22 1205       Pregnancy Intention Screening   Does the patient want to become pregnant in the next year? N/A    Does the patient's partner want to become pregnant in the next year? N/A    Would the patient like to discuss contraceptive options today? N/A      Contraception Wrap Up   Current Method Abstinence   PM   End Method Abstinence   PM   Contraception Counseling Provided No              Examination chaperoned by Malachy Mood LPN   Impression and plan:  1. Encounter for gynecological  examination with Papanicolaou smear of cervix Pap sent Physical in 1 year Labs with PCP Mammogram 06/13/22  stable - Cytology - PAP( [place])  2. Encounter for screening fecal occult blood testing Hemoccult was negative   3. Cervical high risk HPV (human papillomavirus) test positive Pap sent  4. Hypertension, unspecified type Take BP meds  Keep check at home Follow up with PCP

## 2022-09-19 DIAGNOSIS — M7052 Other bursitis of knee, left knee: Secondary | ICD-10-CM | POA: Diagnosis not present

## 2022-09-19 DIAGNOSIS — M1712 Unilateral primary osteoarthritis, left knee: Secondary | ICD-10-CM | POA: Diagnosis not present

## 2022-10-02 DIAGNOSIS — M7052 Other bursitis of knee, left knee: Secondary | ICD-10-CM | POA: Diagnosis not present

## 2022-10-02 DIAGNOSIS — M25562 Pain in left knee: Secondary | ICD-10-CM | POA: Diagnosis not present

## 2022-10-16 DIAGNOSIS — E782 Mixed hyperlipidemia: Secondary | ICD-10-CM | POA: Diagnosis not present

## 2022-10-16 DIAGNOSIS — E569 Vitamin deficiency, unspecified: Secondary | ICD-10-CM | POA: Diagnosis not present

## 2022-10-16 DIAGNOSIS — I10 Essential (primary) hypertension: Secondary | ICD-10-CM | POA: Diagnosis not present

## 2022-10-16 DIAGNOSIS — M7052 Other bursitis of knee, left knee: Secondary | ICD-10-CM | POA: Diagnosis not present

## 2022-10-16 DIAGNOSIS — M1712 Unilateral primary osteoarthritis, left knee: Secondary | ICD-10-CM | POA: Diagnosis not present

## 2022-10-18 DIAGNOSIS — I129 Hypertensive chronic kidney disease with stage 1 through stage 4 chronic kidney disease, or unspecified chronic kidney disease: Secondary | ICD-10-CM | POA: Diagnosis not present

## 2022-10-18 DIAGNOSIS — E782 Mixed hyperlipidemia: Secondary | ICD-10-CM | POA: Diagnosis not present

## 2022-10-18 DIAGNOSIS — R809 Proteinuria, unspecified: Secondary | ICD-10-CM | POA: Diagnosis not present

## 2022-10-18 DIAGNOSIS — M7052 Other bursitis of knee, left knee: Secondary | ICD-10-CM | POA: Diagnosis not present

## 2022-10-18 DIAGNOSIS — E669 Obesity, unspecified: Secondary | ICD-10-CM | POA: Diagnosis not present

## 2022-10-18 DIAGNOSIS — R7303 Prediabetes: Secondary | ICD-10-CM | POA: Diagnosis not present

## 2022-10-18 DIAGNOSIS — I10 Essential (primary) hypertension: Secondary | ICD-10-CM | POA: Diagnosis not present

## 2022-10-18 DIAGNOSIS — N189 Chronic kidney disease, unspecified: Secondary | ICD-10-CM | POA: Diagnosis not present

## 2022-10-18 DIAGNOSIS — F411 Generalized anxiety disorder: Secondary | ICD-10-CM | POA: Diagnosis not present

## 2022-10-23 DIAGNOSIS — M7052 Other bursitis of knee, left knee: Secondary | ICD-10-CM | POA: Diagnosis not present

## 2022-10-23 DIAGNOSIS — M25562 Pain in left knee: Secondary | ICD-10-CM | POA: Diagnosis not present

## 2022-11-08 DIAGNOSIS — M25562 Pain in left knee: Secondary | ICD-10-CM | POA: Diagnosis not present

## 2022-11-08 DIAGNOSIS — M7052 Other bursitis of knee, left knee: Secondary | ICD-10-CM | POA: Diagnosis not present

## 2022-11-09 DIAGNOSIS — H04123 Dry eye syndrome of bilateral lacrimal glands: Secondary | ICD-10-CM | POA: Diagnosis not present

## 2022-11-28 DIAGNOSIS — M7052 Other bursitis of knee, left knee: Secondary | ICD-10-CM | POA: Diagnosis not present

## 2022-11-28 DIAGNOSIS — M1712 Unilateral primary osteoarthritis, left knee: Secondary | ICD-10-CM | POA: Diagnosis not present

## 2023-01-22 DIAGNOSIS — M7052 Other bursitis of knee, left knee: Secondary | ICD-10-CM | POA: Diagnosis not present

## 2023-01-22 DIAGNOSIS — M1712 Unilateral primary osteoarthritis, left knee: Secondary | ICD-10-CM | POA: Diagnosis not present

## 2023-01-26 DIAGNOSIS — R7303 Prediabetes: Secondary | ICD-10-CM | POA: Diagnosis not present

## 2023-01-26 DIAGNOSIS — R809 Proteinuria, unspecified: Secondary | ICD-10-CM | POA: Diagnosis not present

## 2023-01-26 DIAGNOSIS — E782 Mixed hyperlipidemia: Secondary | ICD-10-CM | POA: Diagnosis not present

## 2023-01-26 DIAGNOSIS — I10 Essential (primary) hypertension: Secondary | ICD-10-CM | POA: Diagnosis not present

## 2023-01-26 DIAGNOSIS — M7052 Other bursitis of knee, left knee: Secondary | ICD-10-CM | POA: Diagnosis not present

## 2023-01-26 DIAGNOSIS — F419 Anxiety disorder, unspecified: Secondary | ICD-10-CM | POA: Diagnosis not present

## 2023-01-26 DIAGNOSIS — I129 Hypertensive chronic kidney disease with stage 1 through stage 4 chronic kidney disease, or unspecified chronic kidney disease: Secondary | ICD-10-CM | POA: Diagnosis not present

## 2023-01-26 DIAGNOSIS — N189 Chronic kidney disease, unspecified: Secondary | ICD-10-CM | POA: Diagnosis not present

## 2023-01-26 DIAGNOSIS — Z01818 Encounter for other preprocedural examination: Secondary | ICD-10-CM | POA: Diagnosis not present

## 2023-02-13 DIAGNOSIS — E569 Vitamin deficiency, unspecified: Secondary | ICD-10-CM | POA: Diagnosis not present

## 2023-02-13 DIAGNOSIS — I10 Essential (primary) hypertension: Secondary | ICD-10-CM | POA: Diagnosis not present

## 2023-02-13 DIAGNOSIS — E782 Mixed hyperlipidemia: Secondary | ICD-10-CM | POA: Diagnosis not present

## 2023-02-19 ENCOUNTER — Other Ambulatory Visit (HOSPITAL_COMMUNITY): Payer: Self-pay | Admitting: Nurse Practitioner

## 2023-02-19 ENCOUNTER — Encounter (HOSPITAL_COMMUNITY): Payer: Self-pay | Admitting: Nurse Practitioner

## 2023-02-19 DIAGNOSIS — N189 Chronic kidney disease, unspecified: Secondary | ICD-10-CM | POA: Diagnosis not present

## 2023-02-19 DIAGNOSIS — I10 Essential (primary) hypertension: Secondary | ICD-10-CM | POA: Diagnosis not present

## 2023-02-19 DIAGNOSIS — I129 Hypertensive chronic kidney disease with stage 1 through stage 4 chronic kidney disease, or unspecified chronic kidney disease: Secondary | ICD-10-CM | POA: Diagnosis not present

## 2023-02-19 DIAGNOSIS — E559 Vitamin D deficiency, unspecified: Secondary | ICD-10-CM | POA: Diagnosis not present

## 2023-02-19 DIAGNOSIS — E782 Mixed hyperlipidemia: Secondary | ICD-10-CM | POA: Diagnosis not present

## 2023-02-19 DIAGNOSIS — Z13820 Encounter for screening for osteoporosis: Secondary | ICD-10-CM

## 2023-02-19 DIAGNOSIS — R809 Proteinuria, unspecified: Secondary | ICD-10-CM | POA: Diagnosis not present

## 2023-02-19 DIAGNOSIS — F411 Generalized anxiety disorder: Secondary | ICD-10-CM | POA: Diagnosis not present

## 2023-02-19 DIAGNOSIS — E6609 Other obesity due to excess calories: Secondary | ICD-10-CM | POA: Diagnosis not present

## 2023-02-19 DIAGNOSIS — R7303 Prediabetes: Secondary | ICD-10-CM | POA: Diagnosis not present

## 2023-02-23 ENCOUNTER — Other Ambulatory Visit (HOSPITAL_COMMUNITY): Payer: Self-pay | Admitting: Nurse Practitioner

## 2023-02-23 DIAGNOSIS — Z8249 Family history of ischemic heart disease and other diseases of the circulatory system: Secondary | ICD-10-CM

## 2023-03-01 ENCOUNTER — Other Ambulatory Visit (HOSPITAL_COMMUNITY): Payer: Medicare PPO

## 2023-03-01 ENCOUNTER — Ambulatory Visit (HOSPITAL_COMMUNITY): Payer: Medicare PPO

## 2023-03-01 ENCOUNTER — Encounter (HOSPITAL_COMMUNITY): Payer: Self-pay

## 2023-03-10 ENCOUNTER — Ambulatory Visit: Payer: Self-pay | Admitting: Student

## 2023-03-30 NOTE — Progress Notes (Addendum)
 Anesthesia Review:  PCP: Alphonsus Sias clearance in ov note dated 01/26/23 in Media Tab dated 01/30/23  Cardiologist :  PPM/ ICD: Device Orders: Rep Notified:  Chest x-ray : EKG : Echo : Stress test: Cardiac Cath :   Activity level:  Sleep Study/ CPAP : Fasting Blood Sugar :      / Checks Blood Sugar -- times a day:    Blood Thinner/ Instructions /Last Dose: ASA / Instructions/ Last Dose :

## 2023-03-30 NOTE — Patient Instructions (Signed)
 SURGICAL WAITING ROOM VISITATION  Patients having surgery or a procedure may have no more than 2 support people in the waiting area - these visitors may rotate.    Children under the age of 15 must have an adult with them who is not the patient.  Due to an increase in RSV and influenza rates and associated hospitalizations, children ages 2 and under may not visit patients in [facility] hospitals.  Visitors with respiratory illnesses are discouraged from visiting and should remain at home.  If the patient needs to stay at the hospital during part of their recovery, the visitor guidelines for inpatient rooms apply. Pre-op nurse will coordinate an appropriate time for 1 support person to accompany patient in pre-op.  This support person may not rotate.    Please refer to the [facility] website for the visitor guidelines for Inpatients (after your surgery is over and you are in a regular room).       Your procedure is scheduled on:  04/11/2023    Report to [facility] Main Entrance    Report to admitting at   0830AM   Call this number if you have problems the morning of surgery [phone]   Do not eat food :After Midnight.   After Midnight you may have the following liquids until __ 0800____ AM  DAY OF SURGERY  Water Non-Citrus Juices (without pulp, NO RED-Apple, White grape, White cranberry) Black Coffee (NO MILK/CREAM OR CREAMERS, sugar ok)  Clear Tea (NO MILK/CREAM OR CREAMERS, sugar ok) regular and decaf                             Plain Jell-O (NO RED)                                           Fruit ices (not with fruit pulp, NO RED)                                     Popsicles (NO RED)                                                               Sports drinks like Gatorade (NO RED)                   The day of surgery:  Drink ONE (1) Pre-Surgery Clear Ensure or G2 at  0800AM ( have completed by )  the morning of surgery. Drink in one sitting. Do not sip.   This drink was given to you during your hospital  pre-op appointment visit. Nothing else to drink after completing the  Pre-Surgery Clear Ensure or G2.          If you have questions, please contact your surgeon's office.       Oral Hygiene is also important to reduce your risk of infection.  Remember - BRUSH YOUR TEETH THE MORNING OF SURGERY WITH YOUR REGULAR TOOTHPASTE  DENTURES WILL BE REMOVED PRIOR TO SURGERY PLEASE DO NOT APPLY "Poly grip" OR ADHESIVES!!!   Do NOT smoke after Midnight   Stop all vitamins and herbal supplements 7 days before surgery.   Take these medicines the morning of surgery with A SIP OF WATER:  none   DO NOT TAKE ANY ORAL DIABETIC MEDICATIONS DAY OF YOUR SURGERY  Bring CPAP mask and tubing day of surgery.                              You may not have any metal on your body including hair pins, jewelry, and body piercing             Do not wear make-up, lotions, powders, perfumes/cologne, or deodorant  Do not wear nail polish including gel and S&S, artificial/acrylic nails, or any other type of covering on natural nails including finger and toenails. If you have artificial nails, gel coating, etc. that needs to be removed by a nail salon please have this removed prior to surgery or surgery may need to be canceled/ delayed if the surgeon/ anesthesia feels like they are unable to be safely monitored.   Do not shave  48 hours prior to surgery.               Men may shave face and neck.   Do not bring valuables to the hospital. [facility] IS NOT             RESPONSIBLE   FOR VALUABLES.   Contacts, glasses, dentures or bridgework may not be worn into surgery.   Bring small overnight bag day of surgery.   DO NOT BRING YOUR HOME MEDICATIONS TO THE HOSPITAL. PHARMACY WILL DISPENSE MEDICATIONS LISTED ON YOUR MEDICATION LIST TO YOU DURING YOUR ADMISSION IN THE HOSPITAL!    Patients discharged on the day of surgery will  not be allowed to drive home.  Someone NEEDS to stay with you for the first 24 hours after anesthesia.   Special Instructions: Bring a copy of your healthcare power of attorney and living will documents the day of surgery if you haven't scanned them before.              Please read over the following fact sheets you were given: IF YOU HAVE QUESTIONS ABOUT YOUR PRE-OP INSTRUCTIONS PLEASE CALL [phone]   If you received a COVID test during your pre-op visit  it is requested that you wear a mask when out in public, stay away from anyone that may not be feeling well and notify your surgeon if you develop symptoms. If you test positive for Covid or have been in contact with anyone that has tested positive in the last 10 days please notify you surgeon.      Pre-operative 5 CHG Bath Instructions   You can play a key role in reducing the risk of infection after surgery. Your skin needs to be as free of germs as possible. You can reduce the number of germs on your skin by washing with CHG (chlorhexidine gluconate) soap before surgery. CHG is an antiseptic soap that kills germs and continues to kill germs even after washing.   DO NOT use if you have an allergy to chlorhexidine/CHG or antibacterial soaps. If your skin becomes reddened or irritated, stop using the CHG and notify one of our RNs at  [phone].   Please shower with the CHG soap starting 4 days before surgery using the following schedule:     Please keep in mind the following:  DO NOT shave, including legs and underarms, starting the day of your first shower.   You may shave your face at any point before/day of surgery.  Place clean sheets on your bed the day you start using CHG soap. Use a clean washcloth (not used since being washed) for each shower. DO NOT sleep with pets once you start using the CHG.   CHG Shower Instructions:  If you choose to wash your hair and private area, wash first with your normal shampoo/soap.  After  you use shampoo/soap, rinse your hair and body thoroughly to remove shampoo/soap residue.  Turn the water OFF and apply about 3 tablespoons (45 ml) of CHG soap to a CLEAN washcloth.  Apply CHG soap ONLY FROM YOUR NECK DOWN TO YOUR TOES (washing for 3-5 minutes)  DO NOT use CHG soap on face, private areas, open wounds, or sores.  Pay special attention to the area where your surgery is being performed.  If you are having back surgery, having someone wash your back for you may be helpful. Wait 2 minutes after CHG soap is applied, then you may rinse off the CHG soap.  Pat dry with a clean towel  Put on clean clothes/pajamas   If you choose to wear lotion, please use ONLY the CHG-compatible lotions on the back of this paper.     Additional instructions for the day of surgery: DO NOT APPLY any lotions, deodorants, cologne, or perfumes.   Put on clean/comfortable clothes.  Brush your teeth.  Ask your nurse before applying any prescription medications to the skin.      CHG Compatible Lotions   Aveeno Moisturizing lotion  Cetaphil Moisturizing Cream  Cetaphil Moisturizing Lotion  Clairol Herbal Essence Moisturizing Lotion, Dry Skin  Clairol Herbal Essence Moisturizing Lotion, Extra Dry Skin  Clairol Herbal Essence Moisturizing Lotion, Normal Skin  Curel Age Defying Therapeutic Moisturizing Lotion with Alpha Hydroxy  Curel Extreme Care Body Lotion  Curel Soothing Hands Moisturizing Hand Lotion  Curel Therapeutic Moisturizing Cream, Fragrance-Free  Curel Therapeutic Moisturizing Lotion, Fragrance-Free  Curel Therapeutic Moisturizing Lotion, Original Formula  Eucerin Daily Replenishing Lotion  Eucerin Dry Skin Therapy Plus Alpha Hydroxy Crme  Eucerin Dry Skin Therapy Plus Alpha Hydroxy Lotion  Eucerin Original Crme  Eucerin Original Lotion  Eucerin Plus Crme Eucerin Plus Lotion  Eucerin TriLipid Replenishing Lotion  Keri Anti-Bacterial Hand Lotion  Keri Deep Conditioning Original  Lotion Dry Skin Formula Softly Scented  Keri Deep Conditioning Original Lotion, Fragrance Free Sensitive Skin Formula  Keri Lotion Fast Absorbing Fragrance Free Sensitive Skin Formula  Keri Lotion Fast Absorbing Softly Scented Dry Skin Formula  Keri Original Lotion  Keri Skin Renewal Lotion Keri Silky Smooth Lotion  Keri Silky Smooth Sensitive Skin Lotion  Nivea Body Creamy Conditioning Oil  Nivea Body Extra Enriched Teacher, adult education Moisturizing Lotion Nivea Crme  Nivea Skin Firming Lotion  NutraDerm 30 Skin Lotion  NutraDerm Skin Lotion  NutraDerm Therapeutic Skin Cream  NutraDerm Therapeutic Skin Lotion  ProShield Protective Hand Cream  Provon moisturizing lotion

## 2023-04-03 ENCOUNTER — Encounter (HOSPITAL_COMMUNITY): Payer: Self-pay

## 2023-04-03 ENCOUNTER — Encounter (HOSPITAL_COMMUNITY)
Admission: RE | Admit: 2023-04-03 | Discharge: 2023-04-03 | Disposition: A | Discharge status: Home / Self Care | Payer: Medicare PPO | Source: Ambulatory Visit | Attending: Internal Medicine | Admitting: Internal Medicine

## 2023-04-11 ENCOUNTER — Ambulatory Visit (HOSPITAL_COMMUNITY): Admission: RE | Admit: 2023-04-11 | Payer: Medicare PPO | Source: Home / Self Care | Admitting: Orthopedic Surgery

## 2023-04-11 ENCOUNTER — Encounter (HOSPITAL_COMMUNITY): Admission: RE | Payer: Self-pay | Source: Home / Self Care

## 2023-04-11 SURGERY — ARTHROPLASTY, KNEE, TOTAL, USING IMAGELESS COMPUTER-ASSISTED NAVIGATION
Anesthesia: Spinal | Site: Knee | Laterality: Left

## 2023-04-17 ENCOUNTER — Encounter: Payer: Self-pay | Admitting: *Deleted

## 2023-04-27 ENCOUNTER — Telehealth: Payer: Self-pay

## 2023-04-27 NOTE — Progress Notes (Signed)
   04/27/2023  Patient ID: Linda Page, female   DOB: [birth year], 73 y.o.   MRN: [id number]   Patient appeared on insurance report for not passing the quality metrics in 2024:  Medication Adherence for Cholesterol (MAC)   Outreach to the patient was not needed today.  Meds Tracking:  -Atorvastatin  20 mg - Last fill 90DS on 02/10/23, LDL 105 on 02/13/23 (poor adherence prior to labs). Does not qualify for metric yet this year, next fill due 05/11/23.  -Lisinopril  20 mg - Last fill 90DS 04/17/23, BP 126/84 on 02/19/23. Qualifies for metric this year, next fill due 07/16/23.  Plan:  LDL not at goal however likely due to poor adherence (taking twice weekly prior to labs). Will review fill history on 05/14/23.   Flint Hummer, PharmD

## 2023-05-04 ENCOUNTER — Ambulatory Visit: Payer: Self-pay | Admitting: Student

## 2023-05-04 NOTE — H&P (View-Only) (Signed)
 TOTAL KNEE ADMISSION H&P  Patient is being admitted for left total knee arthroplasty.  Subjective:  Chief Complaint:left knee pain.  HPI: Linda Page, 73 y.o. female, has a history of pain and functional disability in the left knee due to arthritis and has failed non-surgical conservative treatments for greater than 12 weeks to includeNSAID's and/or analgesics, corticosteriod injections, flexibility and strengthening excercises, use of assistive devices, and activity modification.  Onset of symptoms was gradual, starting 10 years ago with rapidlly worsening course since that time. The patient noted no past surgery on the left knee(s).  Patient currently rates pain in the left knee(s) at 10 out of 10 with activity. Patient has night pain, worsening of pain with activity and weight bearing, pain that interferes with activities of daily living, pain with passive range of motion, crepitus, and joint swelling.  Patient has evidence of subchondral cysts, subchondral sclerosis, periarticular osteophytes, and joint space narrowing by imaging studies. There is no active infection.  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   Upper abdominal pain    Gastric foreign body    BV (bacterial vaginosis) 04/06/2021   Cervical high risk HPV (human papillomavirus) test positive 04/06/2021   Encounter for gynecological examination with Papanicolaou smear of cervix 03/31/2021   Encounter for screening fecal occult blood testing 03/31/2021   Screening mammogram for breast cancer 03/31/2021   Osteoarthritis of right knee 04/03/2019   Preventative health care 08/12/2018   Neck pain 04/29/2015   Degeneration of cervical intervertebral disc 09/24/2013   Pain in joint, shoulder region 09/24/2013   Muscle weakness (generalized) 09/24/2013   Decreased range of motion of neck 09/24/2013   Past Medical History:  Diagnosis Date   Anxiety     Arthritis    in neck, in knee has gel shots givin in knee once a week for 3 weeks   BV (bacterial vaginosis) 04/06/2021   /329/23 rx flagyl    Cervical high risk HPV (human papillomavirus) test positive 04/06/2021   Repeat pap in 1 year per ASCCP guidelines 5 year risk for CIN3+ 4.8%   Depression    Hypertension    Pre-diabetes     Past Surgical History:  Procedure Laterality Date   ANTERIOR CERVICAL DECOMP/DISCECTOMY FUSION N/A 04/29/2015   Procedure: ACDF C4-7;  Surgeon: Mort Ards, MD;  Location: [facility] OR;  Service: Orthopedics;  Laterality: N/A;   BIOPSY  05/16/2021   Procedure: BIOPSY;  Surgeon: Vinetta Greening, DO;  Location: [facility] ENDO SUITE;  Service: Endoscopy;;  gastric erosion   BREAST BIOPSY Right 10/27/2021   benign hyalinized fibroadenoma   cataract surgery     bilateral   COLONOSCOPY WITH PROPOFOL  N/A 11/05/2018   Procedure: COLONOSCOPY WITH PROPOFOL ;  Surgeon: Alyce Jubilee, MD;  Location: [facility] SU[facility];  Service: Endoscopy;  Laterality: N/A;  10:30am   ESOPHAGOGASTRODUODENOSCOPY (EGD) WITH PROPOFOL  N/A 05/16/2021   Procedure: ESOPHAGOGASTRODUODENOSCOPY (EGD) WITH PROPOFOL ;  Surgeon: Vinetta Greening, DO;  Location: [facility];  Service: Endoscopy;  Laterality: N/A;   ESOPHAGOGASTRODUODENOSCOPY (EGD) WITH PROPOFOL  N/A 05/15/2021   Procedure: ESOPHAGOGASTRODUODENOSCOPY (EGD) WITH PROPOFOL ;  Surgeon: Urban Garden, MD;  Location: [facility];  Service: Gastroenterology;  Laterality: N/A;   HAND SURGERY     rt   KNEE ARTHROPLASTY Right 04/03/2019   Procedure: COMPUTER ASSISTED TOTAL KNEE ARTHROPLASTY;  Surgeon: Adonica Hoose, MD;  Location: [facility] ORS;  Service: Orthopedics;  Laterality: Right;  KNEE SURGERY     rt   PLANTAR FASCIA RELEASE Left 03/2022   right hand surgery lump removed       Current Outpatient Medications  Medication Sig Dispense Refill Last Dose/Taking   acetaminophen  (TYLENOL ) 500 MG tablet Take 1,000 mg by mouth every 6 (six)  hours as needed for moderate pain (pain score 4-6).      ALPRAZolam  (XANAX ) 1 MG tablet Take 5 mg by mouth 2 (two) times daily.      atorvastatin  (LIPITOR) 20 MG tablet Take 20 mg by mouth at bedtime.       diphenhydrAMINE  (BENADRYL ) 25 MG tablet Take 25 mg by mouth at bedtime as needed for allergies.      fexofenadine (ALLEGRA) 180 MG tablet Take 180 mg by mouth daily as needed for allergies or rhinitis.      lisinopril  (ZESTRIL ) 20 MG tablet Take 20 mg by mouth daily.      sertraline  (ZOLOFT ) 100 MG tablet Take 150 mg by mouth at bedtime.       Vitamin D , Ergocalciferol , (DRISDOL) 1.25 MG (50000 UNIT) CAPS capsule Take 50,000 Units by mouth every Wednesday.      No current facility-administered medications for this visit.   Allergies  Allergen Reactions   Codeine Nausea And Vomiting   Other Itching    The patient stated that she was given cortisone shots in her neck whatever was used to numb the area caused her to itch.    Social History   Tobacco Use   Smoking status: Never   Smokeless tobacco: Never  Substance Use Topics   Alcohol  use: Never    Family History  Problem Relation Age of Onset   Kidney disease Father    Brain cancer Mother    Colon polyps Brother        age 74   Parkinson's disease Brother    COPD Brother    Colon cancer Neg Hx      Review of Systems  Musculoskeletal:  Positive for arthralgias, gait problem and joint swelling.  All other systems reviewed and are negative.   Objective:  Physical Exam Constitutional:      Appearance: Normal appearance.  HENT:     Head: Normocephalic and atraumatic.     Nose: Nose normal.     Mouth/Throat:     Mouth: Mucous membranes are moist.     Pharynx: Oropharynx is clear.  Eyes:     Conjunctiva/sclera: Conjunctivae normal.  Cardiovascular:     Rate and Rhythm: Normal rate and regular rhythm.     Pulses: Normal pulses.     Heart sounds: Normal heart sounds.  Pulmonary:     Effort: Pulmonary effort is  normal.     Breath sounds: Normal breath sounds.  Abdominal:     General: Abdomen is flat.     Palpations: Abdomen is soft.  Genitourinary:    Comments: Deferred. Musculoskeletal:     Cervical back: Normal range of motion and neck supple.     Comments: Examination of the left knee reveals no skin wounds or lesions. Mild swelling, no effusion noted. No warmth or erythema. Improved pain with palpation over the medial joint line, periretinacular tissues, with positive grind sign. Significant pain over the pes anserine insertion with focal swelling. Range of motion 0 to 125 degrees. No ligamentous instability noted. No extensor lag. Painless hip range of motion.   Distally, there is no focal motor or sensory deficit. She has palpable pedal pulses. No  expected pedal edema. No calf tenderness palpation.  Skin:    General: Skin is warm and dry.     Capillary Refill: Capillary refill takes less than 2 seconds.  Neurological:     General: No focal deficit present.     Mental Status: She is alert and oriented to person, place, and time.  Psychiatric:        Mood and Affect: Mood normal.        Behavior: Behavior normal.        Thought Content: Thought content normal.        Judgment: Judgment normal.     Vital signs in last 24 hours: @VSRANGES @  Labs:   Estimated body mass index is 39.48 kg/m as calculated from the following:   Height as of 05/23/23: 5\' 4"  (1.626 m).   Weight as of 08/22/22: 104.3 kg.   Imaging Review Plain radiographs demonstrate severe degenerative joint disease of the left knee(s). The overall alignment issignificant varus. The bone quality appears to be adequate for age and reported activity level.      Assessment/Plan:  End stage arthritis, left knee   The patient history, physical examination, clinical judgment of the provider and imaging studies are consistent with end stage degenerative joint disease of the left knee(s) and total knee arthroplasty is  deemed medically necessary. The treatment options including medical management, injection therapy arthroscopy and arthroplasty were discussed at length. The risks and benefits of total knee arthroplasty were presented and reviewed. The risks due to aseptic loosening, infection, stiffness, patella tracking problems, thromboembolic complications and other imponderables were discussed. The patient acknowledged the explanation, agreed to proceed with the plan and consent was signed. Patient is being admitted for inpatient treatment for surgery, pain control, PT, OT, prophylactic antibiotics, VTE prophylaxis, progressive ambulation and ADL's and discharge planning. The patient is planning to be discharged home with OPPT with an overnight stay.   Therapy Plans: outpatient therapy [facility] 06/04/23.  Disposition: Home with daughter Planned DVT Prophylaxis: aspirin  81mg  BID DME needed: Has rolling walker.  PCP: Cleared.  TXA: IV Allergies:  - Codeine - N/V Anesthesia Concerns: None.  BMI: 38.4 Last HgbA1c: 5.9 Other:  - CKD.  - Oxycodone , zofran .  - No NSAIDs.  - 05/23/23: K+ 4.4, Cr. 0.71, Hgb 12.6.     Patient's anticipated LOS is less than 2 midnights, meeting these requirements: - Younger than 47 - Lives within 1 hour of care - Has a competent adult at home to recover with post-op recover - NO history of  - Chronic pain requiring opiods  - Diabetes  - Coronary Artery Disease  - Heart failure  - Heart attack  - Stroke  - DVT/VTE  - Cardiac arrhythmia  - Respiratory Failure/COPD  - Renal failure  - Anemia  - Advanced Liver disease

## 2023-05-04 NOTE — H&P (Signed)
 TOTAL KNEE ADMISSION H&P  Patient is being admitted for left total knee arthroplasty.  Subjective:  Chief Complaint:left knee pain.  HPI: Linda Page, 73 y.o. female, has a history of pain and functional disability in the left knee due to arthritis and has failed non-surgical conservative treatments for greater than 12 weeks to includeNSAID's and/or analgesics, corticosteriod injections, flexibility and strengthening excercises, use of assistive devices, and activity modification.  Onset of symptoms was gradual, starting 10 years ago with rapidlly worsening course since that time. The patient noted no past surgery on the left knee(s).  Patient currently rates pain in the left knee(s) at 10 out of 10 with activity. Patient has night pain, worsening of pain with activity and weight bearing, pain that interferes with activities of daily living, pain with passive range of motion, crepitus, and joint swelling.  Patient has evidence of subchondral cysts, subchondral sclerosis, periarticular osteophytes, and joint space narrowing by imaging studies. There is no active infection.  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   Upper abdominal pain    Gastric foreign body    BV (bacterial vaginosis) 04/06/2021   Cervical high risk HPV (human papillomavirus) test positive 04/06/2021   Encounter for gynecological examination with Papanicolaou smear of cervix 03/31/2021   Encounter for screening fecal occult blood testing 03/31/2021   Screening mammogram for breast cancer 03/31/2021   Osteoarthritis of right knee 04/03/2019   Preventative health care 08/12/2018   Neck pain 04/29/2015   Degeneration of cervical intervertebral disc 09/24/2013   Pain in joint, shoulder region 09/24/2013   Muscle weakness (generalized) 09/24/2013   Decreased range of motion of neck 09/24/2013   Past Medical History:  Diagnosis Date   Anxiety     Arthritis    in neck, in knee has gel shots givin in knee once a week for 3 weeks   BV (bacterial vaginosis) 04/06/2021   /329/23 rx flagyl    Cervical high risk HPV (human papillomavirus) test positive 04/06/2021   Repeat pap in 1 year per ASCCP guidelines 5 year risk for CIN3+ 4.8%   Depression    Hypertension    Pre-diabetes     Past Surgical History:  Procedure Laterality Date   ANTERIOR CERVICAL DECOMP/DISCECTOMY FUSION N/A 04/29/2015   Procedure: ACDF C4-7;  Surgeon: Mort Ards, MD;  Location: [facility];  Service: Orthopedics;  Laterality: N/A;   BIOPSY  05/16/2021   Procedure: BIOPSY;  Surgeon: Vinetta Greening, DO;  Location: [facility];  Service: Endoscopy;;  gastric erosion   BREAST BIOPSY Right 10/27/2021   benign hyalinized fibroadenoma   cataract surgery     bilateral   COLONOSCOPY WITH PROPOFOL  N/A 11/05/2018   Procedure: COLONOSCOPY WITH PROPOFOL ;  Surgeon: Alyce Jubilee, MD;  Location: [facility];  Service: Endoscopy;  Laterality: N/A;  10:30am   ESOPHAGOGASTRODUODENOSCOPY (EGD) WITH PROPOFOL  N/A 05/16/2021   Procedure: ESOPHAGOGASTRODUODENOSCOPY (EGD) WITH PROPOFOL ;  Surgeon: Vinetta Greening, DO;  Location: [facility];  Service: Endoscopy;  Laterality: N/A;   ESOPHAGOGASTRODUODENOSCOPY (EGD) WITH PROPOFOL  N/A 05/15/2021   Procedure: ESOPHAGOGASTRODUODENOSCOPY (EGD) WITH PROPOFOL ;  Surgeon: Urban Garden, MD;  Location: [facility];  Service: Gastroenterology;  Laterality: N/A;   HAND SURGERY     rt   KNEE ARTHROPLASTY Right 04/03/2019   Procedure: COMPUTER ASSISTED TOTAL KNEE ARTHROPLASTY;  Surgeon: Adonica Hoose, MD;  Location: [facility];  Service: Orthopedics;  Laterality: Right;  KNEE SURGERY     rt   PLANTAR FASCIA RELEASE Left 03/2022   right hand surgery lump removed       Current Outpatient Medications  Medication Sig Dispense Refill Last Dose/Taking   acetaminophen  (TYLENOL ) 500 MG tablet Take 1,000 mg by mouth every 6 (six)  hours as needed for moderate pain (pain score 4-6).      ALPRAZolam  (XANAX ) 1 MG tablet Take 5 mg by mouth 2 (two) times daily.      atorvastatin  (LIPITOR) 20 MG tablet Take 20 mg by mouth at bedtime.       diphenhydrAMINE  (BENADRYL ) 25 MG tablet Take 25 mg by mouth at bedtime as needed for allergies.      fexofenadine (ALLEGRA) 180 MG tablet Take 180 mg by mouth daily as needed for allergies or rhinitis.      lisinopril  (ZESTRIL ) 20 MG tablet Take 20 mg by mouth daily.      sertraline  (ZOLOFT ) 100 MG tablet Take 150 mg by mouth at bedtime.       Vitamin D , Ergocalciferol , (DRISDOL) 1.25 MG (50000 UNIT) CAPS capsule Take 50,000 Units by mouth every Wednesday.      No current facility-administered medications for this visit.   Allergies  Allergen Reactions   Codeine Nausea And Vomiting   Other Itching    The patient stated that she was given cortisone shots in her neck whatever was used to numb the area caused her to itch.    Social History   Tobacco Use   Smoking status: Never   Smokeless tobacco: Never  Substance Use Topics   Alcohol  use: Never    Family History  Problem Relation Age of Onset   Kidney disease Father    Brain cancer Mother    Colon polyps Brother        age 74   Parkinson's disease Brother    COPD Brother    Colon cancer Neg Hx      Review of Systems  Musculoskeletal:  Positive for arthralgias, gait problem and joint swelling.  All other systems reviewed and are negative.   Objective:  Physical Exam Constitutional:      Appearance: Normal appearance.  HENT:     Head: Normocephalic and atraumatic.     Nose: Nose normal.     Mouth/Throat:     Mouth: Mucous membranes are moist.     Pharynx: Oropharynx is clear.  Eyes:     Conjunctiva/sclera: Conjunctivae normal.  Cardiovascular:     Rate and Rhythm: Normal rate and regular rhythm.     Pulses: Normal pulses.     Heart sounds: Normal heart sounds.  Pulmonary:     Effort: Pulmonary effort is  normal.     Breath sounds: Normal breath sounds.  Abdominal:     General: Abdomen is flat.     Palpations: Abdomen is soft.  Genitourinary:    Comments: Deferred. Musculoskeletal:     Cervical back: Normal range of motion and neck supple.     Comments: Examination of the left knee reveals no skin wounds or lesions. Mild swelling, no effusion noted. No warmth or erythema. Improved pain with palpation over the medial joint line, periretinacular tissues, with positive grind sign. Significant pain over the pes anserine insertion with focal swelling. Range of motion 0 to 125 degrees. No ligamentous instability noted. No extensor lag. Painless hip range of motion.   Distally, there is no focal motor or sensory deficit. She has palpable pedal pulses. No  expected pedal edema. No calf tenderness palpation.  Skin:    General: Skin is warm and dry.     Capillary Refill: Capillary refill takes less than 2 seconds.  Neurological:     General: No focal deficit present.     Mental Status: She is alert and oriented to person, place, and time.  Psychiatric:        Mood and Affect: Mood normal.        Behavior: Behavior normal.        Thought Content: Thought content normal.        Judgment: Judgment normal.     Vital signs in last 24 hours: @VSRANGES @  Labs:   Estimated body mass index is 39.48 kg/m as calculated from the following:   Height as of 05/23/23: 5\' 4"  (1.626 m).   Weight as of 08/22/22: 104.3 kg.   Imaging Review Plain radiographs demonstrate severe degenerative joint disease of the left knee(s). The overall alignment issignificant varus. The bone quality appears to be adequate for age and reported activity level.      Assessment/Plan:  End stage arthritis, left knee   The patient history, physical examination, clinical judgment of the provider and imaging studies are consistent with end stage degenerative joint disease of the left knee(s) and total knee arthroplasty is  deemed medically necessary. The treatment options including medical management, injection therapy arthroscopy and arthroplasty were discussed at length. The risks and benefits of total knee arthroplasty were presented and reviewed. The risks due to aseptic loosening, infection, stiffness, patella tracking problems, thromboembolic complications and other imponderables were discussed. The patient acknowledged the explanation, agreed to proceed with the plan and consent was signed. Patient is being admitted for inpatient treatment for surgery, pain control, PT, OT, prophylactic antibiotics, VTE prophylaxis, progressive ambulation and ADL's and discharge planning. The patient is planning to be discharged home with OPPT with an overnight stay.   Therapy Plans: outpatient therapy [facility] 06/04/23.  Disposition: Home with daughter Planned DVT Prophylaxis: aspirin  81mg  BID DME needed: Has rolling walker.  PCP: Cleared.  TXA: IV Allergies:  - Codeine - N/V Anesthesia Concerns: None.  BMI: 38.4 Last HgbA1c: 5.9 Other:  - CKD.  - Oxycodone , zofran .  - No NSAIDs.  - 05/23/23: K+ 4.4, Cr. 0.71, Hgb 12.6.     Patient's anticipated LOS is less than 2 midnights, meeting these requirements: - Younger than 47 - Lives within 1 hour of care - Has a competent adult at home to recover with post-op recover - NO history of  - Chronic pain requiring opiods  - Diabetes  - Coronary Artery Disease  - Heart failure  - Heart attack  - Stroke  - DVT/VTE  - Cardiac arrhythmia  - Respiratory Failure/COPD  - Renal failure  - Anemia  - Advanced Liver disease

## 2023-05-09 DIAGNOSIS — M1712 Unilateral primary osteoarthritis, left knee: Secondary | ICD-10-CM | POA: Diagnosis not present

## 2023-05-14 ENCOUNTER — Telehealth: Payer: Self-pay

## 2023-05-14 NOTE — Progress Notes (Signed)
   05/14/2023  Patient ID: Linda Page, female   DOB: [birth year], 73 y.o.   MRN: [id number]   Adherence Monitoring    Confirmed with patient and pharmacy she picked up atorvastatin  05/12/23, documentation in [employer].  Flint Hummer, PharmD

## 2023-05-21 NOTE — Patient Instructions (Signed)
 SURGICAL WAITING ROOM VISITATION  Patients having surgery or a procedure may have no more than 2 support people in the waiting area - these visitors may rotate.    Children under the age of 17 must have an adult with them who is not the patient.  Due to an increase in RSV and influenza rates and associated hospitalizations, children ages 73 and under may not visit patients in [facility] hospitals.  Visitors with respiratory illnesses are discouraged from visiting and should remain at home.  If the patient needs to stay at the hospital during part of their recovery, the visitor guidelines for inpatient rooms apply. Pre-op nurse will coordinate an appropriate time for 1 support person to accompany patient in pre-op.  This support person may not rotate.    Please refer to the [facility] website for the visitor guidelines for Inpatients (after your surgery is over and you are in a regular room).       Your procedure is scheduled on:  05/30/23    Report to [facility] Main Entrance    Report to admitting at   1230 pm     Call this number if you have problems the morning of surgery [phone]   Do not eat food :After Midnight.   After Midnight you may have the following liquids until _ 1200 noon   DAY OF SURGERY  Water  Non-Citrus Juices (without pulp, NO RED-Apple, White grape, White cranberry) Black Coffee (NO MILK/CREAM OR CREAMERS, sugar ok)  Clear Tea (NO MILK/CREAM OR CREAMERS, sugar ok) regular and decaf                             Plain Jell-O (NO RED)                                           Fruit ices (not with fruit pulp, NO RED)                                     Popsicles (NO RED)                                                               Sports drinks like Gatorade (NO RED)                    The day of surgery:  Drink ONE (1) Pre-Surgery Clear Ensure or G2 at  1200 noon  day  of surgery. Drink in one sitting. Do not sip.  This drink was given to you  during your hospital  pre-op appointment visit. Nothing else to drink after completing the  Pre-Surgery Clear Ensure or G2.          If you have questions, please contact your surgeon's office.       Oral Hygiene is also important to reduce your risk of infection.  Remember - BRUSH YOUR TEETH THE MORNING OF SURGERY WITH YOUR REGULAR TOOTHPASTE  DENTURES WILL BE REMOVED PRIOR TO SURGERY PLEASE DO NOT APPLY "Poly grip" OR ADHESIVES!!!   Do NOT smoke after Midnight   Stop all vitamins and herbal supplements 7 days before surgery.   Take these medicines the morning of surgery with A SIP OF WATER :  xanax , allegra if needed   DO NOT TAKE ANY ORAL DIABETIC MEDICATIONS DAY OF YOUR SURGERY  Bring CPAP mask and tubing day of surgery.                              You may not have any metal on your body including hair pins, jewelry, and body piercing             Do not wear make-up, lotions, powders, perfumes/cologne, or deodorant  Do not wear nail polish including gel and S&S, artificial/acrylic nails, or any other type of covering on natural nails including finger and toenails. If you have artificial nails, gel coating, etc. that needs to be removed by a nail salon please have this removed prior to surgery or surgery may need to be canceled/ delayed if the surgeon/ anesthesia feels like they are unable to be safely monitored.   Do not shave  48 hours prior to surgery.               Men may shave face and neck.   Do not bring valuables to the hospital. [place] IS NOT             RESPONSIBLE   FOR VALUABLES.   Contacts, glasses, dentures or bridgework may not be worn into surgery.   Bring small overnight bag day of surgery.   DO NOT BRING YOUR HOME MEDICATIONS TO THE HOSPITAL. PHARMACY WILL DISPENSE MEDICATIONS LISTED ON YOUR MEDICATION LIST TO YOU DURING YOUR ADMISSION IN THE HOSPITAL!    Patients discharged on the day of surgery will not be  allowed to drive home.  Someone NEEDS to stay with you for the first 24 hours after anesthesia.   Special Instructions: Bring a copy of your healthcare power of attorney and living will documents the day of surgery if you haven't scanned them before.              Please read over the following fact sheets you were given: IF YOU HAVE QUESTIONS ABOUT YOUR PRE-OP INSTRUCTIONS PLEASE CALL [phone]   If you received a COVID test during your pre-op visit  it is requested that you wear a mask when out in public, stay away from anyone that may not be feeling well and notify your surgeon if you develop symptoms. If you test positive for Covid or have been in contact with anyone that has tested positive in the last 10 days please notify you surgeon.      Pre-operative 5 CHG Bath Instructions   You can play a key role in reducing the risk of infection after surgery. Your skin needs to be as free of germs as possible. You can reduce the number of germs on your skin by washing with CHG (chlorhexidine  gluconate) soap before surgery. CHG is an antiseptic soap that kills germs and continues to kill germs even after washing.   DO NOT use if you have an allergy to chlorhexidine /CHG or antibacterial soaps. If your skin becomes reddened or irritated, stop using the CHG and notify one of  our RNs at [phone].   Please shower with the CHG soap starting 4 days before surgery using the following schedule:     Please keep in mind the following:  DO NOT shave, including legs and underarms, starting the day of your first shower.   You may shave your face at any point before/day of surgery.  Place clean sheets on your bed the day you start using CHG soap. Use a clean washcloth (not used since being washed) for each shower. DO NOT sleep with pets once you start using the CHG.   CHG Shower Instructions:  If you choose to wash your hair and private area, wash first with your normal shampoo/soap.  After you use  shampoo/soap, rinse your hair and body thoroughly to remove shampoo/soap residue.  Turn the water  OFF and apply about 3 tablespoons (45 ml) of CHG soap to a CLEAN washcloth.  Apply CHG soap ONLY FROM YOUR NECK DOWN TO YOUR TOES (washing for 3-5 minutes)  DO NOT use CHG soap on face, private areas, open wounds, or sores.  Pay special attention to the area where your surgery is being performed.  If you are having back surgery, having someone wash your back for you may be helpful. Wait 2 minutes after CHG soap is applied, then you may rinse off the CHG soap.  Pat dry with a clean towel  Put on clean clothes/pajamas   If you choose to wear lotion, please use ONLY the CHG-compatible lotions on the back of this paper.     Additional instructions for the day of surgery: DO NOT APPLY any lotions, deodorants, cologne, or perfumes.   Put on clean/comfortable clothes.  Brush your teeth.  Ask your nurse before applying any prescription medications to the skin.      CHG Compatible Lotions   Aveeno Moisturizing lotion  Cetaphil Moisturizing Cream  Cetaphil Moisturizing Lotion  Clairol Herbal Essence Moisturizing Lotion, Dry Skin  Clairol Herbal Essence Moisturizing Lotion, Extra Dry Skin  Clairol Herbal Essence Moisturizing Lotion, Normal Skin  Curel Age Defying Therapeutic Moisturizing Lotion with Alpha Hydroxy  Curel Extreme Care Body Lotion  Curel Soothing Hands Moisturizing Hand Lotion  Curel Therapeutic Moisturizing Cream, Fragrance-Free  Curel Therapeutic Moisturizing Lotion, Fragrance-Free  Curel Therapeutic Moisturizing Lotion, Original Formula  Eucerin Daily Replenishing Lotion  Eucerin Dry Skin Therapy Plus Alpha Hydroxy Crme  Eucerin Dry Skin Therapy Plus Alpha Hydroxy Lotion  Eucerin Original Crme  Eucerin Original Lotion  Eucerin Plus Crme Eucerin Plus Lotion  Eucerin TriLipid Replenishing Lotion  Keri Anti-Bacterial Hand Lotion  Keri Deep Conditioning Original Lotion Dry  Skin Formula Softly Scented  Keri Deep Conditioning Original Lotion, Fragrance Free Sensitive Skin Formula  Keri Lotion Fast Absorbing Fragrance Free Sensitive Skin Formula  Keri Lotion Fast Absorbing Softly Scented Dry Skin Formula  Keri Original Lotion  Keri Skin Renewal Lotion Keri Silky Smooth Lotion  Keri Silky Smooth Sensitive Skin Lotion  Nivea Body Creamy Conditioning Oil  Nivea Body Extra Enriched Teacher, adult education Moisturizing Lotion Nivea Crme  Nivea Skin Firming Lotion  NutraDerm 30 Skin Lotion  NutraDerm Skin Lotion  NutraDerm Therapeutic Skin Cream  NutraDerm Therapeutic Skin Lotion  ProShield Protective Hand Cream  Provon moisturizing lotion

## 2023-05-21 NOTE — Progress Notes (Signed)
 Anesthesia Review:  PCP: Cardiologist :  PPM/ ICD: Device Orders: Rep Notified:  Chest x-ray : EKG : Echo : Stress test: Cardiac Cath :   Activity level:  Sleep Study/ CPAP : Fasting Blood Sugar :      / Checks Blood Sugar -- times a day:    Blood Thinner/ Instructions /Last Dose: ASA / Instructions/ Last Dose :

## 2023-05-23 ENCOUNTER — Encounter (HOSPITAL_COMMUNITY)
Admission: RE | Admit: 2023-05-23 | Discharge: 2023-05-23 | Disposition: A | Discharge status: Home / Self Care | Source: Ambulatory Visit | Attending: Orthopedic Surgery | Admitting: Orthopedic Surgery

## 2023-05-23 ENCOUNTER — Encounter (HOSPITAL_COMMUNITY): Payer: Self-pay

## 2023-05-23 ENCOUNTER — Other Ambulatory Visit: Payer: Self-pay

## 2023-05-23 VITALS — BP 141/85 | HR 85 | Temp 98.2°F | Resp 16 | Ht 64.0 in

## 2023-05-23 DIAGNOSIS — Z01818 Encounter for other preprocedural examination: Secondary | ICD-10-CM | POA: Diagnosis not present

## 2023-05-23 HISTORY — DX: Prediabetes: R73.03

## 2023-05-23 LAB — BASIC METABOLIC PANEL WITH GFR
Anion gap: 10 (ref 5–15)
BUN: 23 mg/dL (ref 8–23)
CO2: 26 mmol/L (ref 22–32)
Calcium: 9.5 mg/dL (ref 8.9–10.3)
Chloride: 102 mmol/L (ref 98–111)
Creatinine, Ser: 0.71 mg/dL (ref 0.44–1.00)
GFR, Estimated: >60 mL/min (ref >60)
Glucose, Bld: 103 mg/dL — ABNORMAL HIGH (ref 70–99)
Potassium: 4.4 mmol/L (ref 3.5–5.1)
Sodium: 138 mmol/L (ref 135–145)

## 2023-05-23 LAB — SURGICAL PCR SCREEN
MRSA, PCR: NEGATIVE
Staphylococcus aureus: NEGATIVE

## 2023-05-23 LAB — CBC
HCT: 40.1 % (ref 36.0–46.0)
Hemoglobin: 12.6 g/dL (ref 12.0–15.0)
MCH: 29.8 pg (ref 26.0–34.0)
MCHC: 31.4 g/dL (ref 30.0–36.0)
MCV: 94.8 fL (ref 80.0–100.0)
Platelets: 294 10*3/uL (ref 150–400)
RBC: 4.23 MIL/uL (ref 3.87–5.11)
RDW: 13 % (ref 11.5–15.5)
WBC: 8.3 10*3/uL (ref 4.0–10.5)
nRBC: 0 % (ref 0.0–0.2)

## 2023-05-30 ENCOUNTER — Encounter (HOSPITAL_COMMUNITY): Payer: Self-pay | Admitting: Orthopedic Surgery

## 2023-05-30 ENCOUNTER — Ambulatory Visit (HOSPITAL_COMMUNITY): Payer: Self-pay | Admitting: Physician Assistant

## 2023-05-30 ENCOUNTER — Inpatient Hospital Stay (HOSPITAL_COMMUNITY)
Admission: AD | Admit: 2023-05-30 | Discharge: 2023-06-01 | DRG: 470 | Disposition: A | Discharge status: Home / Self Care | Source: Ambulatory Visit | Attending: Orthopedic Surgery | Admitting: Orthopedic Surgery

## 2023-05-30 ENCOUNTER — Ambulatory Visit (HOSPITAL_COMMUNITY): Admitting: Certified Registered Nurse Anesthetist

## 2023-05-30 ENCOUNTER — Other Ambulatory Visit: Payer: Self-pay

## 2023-05-30 ENCOUNTER — Encounter (HOSPITAL_COMMUNITY)
Admission: AD | Disposition: A | Discharge status: Home / Self Care | Payer: Self-pay | Source: Ambulatory Visit | Attending: Orthopedic Surgery

## 2023-05-30 ENCOUNTER — Ambulatory Visit (HOSPITAL_COMMUNITY)

## 2023-05-30 DIAGNOSIS — Z96652 Presence of left artificial knee joint: Secondary | ICD-10-CM

## 2023-05-30 DIAGNOSIS — Z820 Family history of epilepsy and other diseases of the nervous system: Secondary | ICD-10-CM

## 2023-05-30 DIAGNOSIS — M7989 Other specified soft tissue disorders: Secondary | ICD-10-CM | POA: Diagnosis not present

## 2023-05-30 DIAGNOSIS — Z9841 Cataract extraction status, right eye: Secondary | ICD-10-CM | POA: Diagnosis not present

## 2023-05-30 DIAGNOSIS — R7303 Prediabetes: Secondary | ICD-10-CM | POA: Diagnosis present

## 2023-05-30 DIAGNOSIS — I951 Orthostatic hypotension: Secondary | ICD-10-CM | POA: Diagnosis present

## 2023-05-30 DIAGNOSIS — M1712 Unilateral primary osteoarthritis, left knee: Secondary | ICD-10-CM

## 2023-05-30 DIAGNOSIS — Z825 Family history of asthma and other chronic lower respiratory diseases: Secondary | ICD-10-CM

## 2023-05-30 DIAGNOSIS — Z79899 Other long term (current) drug therapy: Secondary | ICD-10-CM | POA: Diagnosis not present

## 2023-05-30 DIAGNOSIS — F32A Depression, unspecified: Secondary | ICD-10-CM | POA: Diagnosis present

## 2023-05-30 DIAGNOSIS — G8918 Other acute postprocedural pain: Secondary | ICD-10-CM | POA: Diagnosis not present

## 2023-05-30 DIAGNOSIS — Z96651 Presence of right artificial knee joint: Secondary | ICD-10-CM | POA: Diagnosis present

## 2023-05-30 DIAGNOSIS — Z9842 Cataract extraction status, left eye: Secondary | ICD-10-CM

## 2023-05-30 DIAGNOSIS — Z841 Family history of disorders of kidney and ureter: Secondary | ICD-10-CM

## 2023-05-30 DIAGNOSIS — D62 Acute posthemorrhagic anemia: Secondary | ICD-10-CM | POA: Diagnosis not present

## 2023-05-30 DIAGNOSIS — Z885 Allergy status to narcotic agent status: Secondary | ICD-10-CM

## 2023-05-30 DIAGNOSIS — Z981 Arthrodesis status: Secondary | ICD-10-CM

## 2023-05-30 DIAGNOSIS — M25562 Pain in left knee: Secondary | ICD-10-CM | POA: Diagnosis present

## 2023-05-30 DIAGNOSIS — Z01818 Encounter for other preprocedural examination: Secondary | ICD-10-CM

## 2023-05-30 DIAGNOSIS — I10 Essential (primary) hypertension: Secondary | ICD-10-CM | POA: Diagnosis present

## 2023-05-30 HISTORY — PX: KNEE ARTHROPLASTY: SHX992

## 2023-05-30 SURGERY — ARTHROPLASTY, KNEE, TOTAL, USING IMAGELESS COMPUTER-ASSISTED NAVIGATION
Anesthesia: Monitor Anesthesia Care | Site: Knee | Laterality: Left

## 2023-05-30 MED ORDER — ONDANSETRON HCL 4 MG/2ML IJ SOLN: 4.0000 mg | Freq: Four times a day (QID) | INTRAMUSCULAR | Status: DC | PRN

## 2023-05-30 MED ORDER — MIDAZOLAM HCL 2 MG/2ML IJ SOLN: 1.0000 mg | INTRAMUSCULAR | Status: DC

## 2023-05-30 MED ORDER — SODIUM CHLORIDE (PF) 0.9 % IJ SOLN
INTRAMUSCULAR | Status: DC | PRN
  Administered 2023-05-30: 61 mL

## 2023-05-30 MED ORDER — DIPHENHYDRAMINE HCL 12.5 MG/5ML PO ELIX: 12.5000 mg | ORAL_SOLUTION | ORAL | Status: DC | PRN

## 2023-05-30 MED ORDER — OXYCODONE HCL 5 MG PO TABS
5.0000 mg | ORAL_TABLET | ORAL | Status: DC | PRN
  Administered 2023-05-31: 10 mg via ORAL
  Filled 2023-05-30 (×3): qty 2

## 2023-05-30 MED ORDER — SODIUM CHLORIDE 0.9 % IR SOLN
Status: DC | PRN
  Administered 2023-05-30: 1000 mL

## 2023-05-30 MED ORDER — SODIUM CHLORIDE 0.9 % IV SOLN: INTRAVENOUS | Status: DC

## 2023-05-30 MED ORDER — SODIUM CHLORIDE (PF) 0.9 % IJ SOLN
INTRAMUSCULAR | Status: AC
Start: 2023-05-30 — End: ?
  Filled 2023-05-30: qty 30

## 2023-05-30 MED ORDER — METOCLOPRAMIDE HCL 5 MG PO TABS: 5.0000 mg | ORAL_TABLET | Freq: Three times a day (TID) | ORAL | Status: DC | PRN

## 2023-05-30 MED ORDER — DEXAMETHASONE SODIUM PHOSPHATE 10 MG/ML IJ SOLN
INTRAMUSCULAR | Status: DC | PRN
Start: 2023-05-30 — End: 2023-05-30
  Administered 2023-05-30: 10 mg via INTRAVENOUS

## 2023-05-30 MED ORDER — METOCLOPRAMIDE HCL 5 MG/ML IJ SOLN: 5.0000 mg | Freq: Three times a day (TID) | INTRAMUSCULAR | Status: DC | PRN

## 2023-05-30 MED ORDER — PROPOFOL 1000 MG/100ML IV EMUL
INTRAVENOUS | Status: AC
  Filled 2023-05-30: qty 100

## 2023-05-30 MED ORDER — OXYCODONE HCL 5 MG PO TABS: 5.0000 mg | ORAL_TABLET | Freq: Once | ORAL | Status: DC | PRN

## 2023-05-30 MED ORDER — OXYCODONE HCL 5 MG/5ML PO SOLN: 5.0000 mg | Freq: Once | ORAL | Status: DC | PRN

## 2023-05-30 MED ORDER — ORAL CARE MOUTH RINSE: 15.0000 mL | Freq: Once | OROMUCOSAL | Status: AC

## 2023-05-30 MED ORDER — FENTANYL CITRATE PF 50 MCG/ML IJ SOSY
50.0000 ug | PREFILLED_SYRINGE | INTRAMUSCULAR | Status: DC
  Administered 2023-05-30: 50 ug via INTRAVENOUS
  Filled 2023-05-30: qty 1

## 2023-05-30 MED ORDER — OXYCODONE HCL 5 MG PO TABS
10.0000 mg | ORAL_TABLET | ORAL | Status: DC | PRN
Start: 2023-05-30 — End: 2023-06-01
  Administered 2023-05-30 – 2023-05-31 (×2): 10 mg via ORAL

## 2023-05-30 MED ORDER — SODIUM CHLORIDE 0.9 % IR SOLN
Status: DC | PRN
  Administered 2023-05-30: 3000 mL

## 2023-05-30 MED ORDER — PHENYLEPHRINE HCL (PRESSORS) 10 MG/ML IV SOLN
INTRAVENOUS | Status: DC | PRN
  Administered 2023-05-30: 100 ug via INTRAVENOUS

## 2023-05-30 MED ORDER — BUPIVACAINE IN DEXTROSE 0.75-8.25 % IT SOLN
INTRATHECAL | Status: DC | PRN
  Administered 2023-05-30: 1.6 mL via INTRATHECAL

## 2023-05-30 MED ORDER — ACETAMINOPHEN 500 MG PO TABS
1000.0000 mg | ORAL_TABLET | Freq: Four times a day (QID) | ORAL | Status: DC
  Administered 2023-05-31 – 2023-06-01 (×2): 1000 mg via ORAL
  Filled 2023-05-30 (×2): qty 2

## 2023-05-30 MED ORDER — METHOCARBAMOL 1000 MG/10ML IJ SOLN: 500.0000 mg | Freq: Four times a day (QID) | INTRAMUSCULAR | Status: DC | PRN

## 2023-05-30 MED ORDER — ONDANSETRON HCL 4 MG/2ML IJ SOLN
INTRAMUSCULAR | Status: AC
  Filled 2023-05-30: qty 2

## 2023-05-30 MED ORDER — DOCUSATE SODIUM 100 MG PO CAPS
100.0000 mg | ORAL_CAPSULE | Freq: Two times a day (BID) | ORAL | Status: DC
  Administered 2023-05-30 – 2023-06-01 (×4): 100 mg via ORAL
  Filled 2023-05-30 (×4): qty 1

## 2023-05-30 MED ORDER — ACETAMINOPHEN 10 MG/ML IV SOLN: 1000.0000 mg | Freq: Once | INTRAVENOUS | Status: DC | PRN

## 2023-05-30 MED ORDER — STERILE WATER FOR IRRIGATION IR SOLN
Status: DC | PRN
  Administered 2023-05-30: 1000 mL

## 2023-05-30 MED ORDER — CHLORHEXIDINE GLUCONATE 0.12 % MT SOLN
15.0000 mL | Freq: Once | OROMUCOSAL | Status: AC
  Administered 2023-05-30: 15 mL via OROMUCOSAL

## 2023-05-30 MED ORDER — BUPIVACAINE-EPINEPHRINE (PF) 0.25% -1:200000 IJ SOLN
INTRAMUSCULAR | Status: AC
  Filled 2023-05-30: qty 30

## 2023-05-30 MED ORDER — POLYETHYLENE GLYCOL 3350 17 G PO PACK: 17.0000 g | PACK | Freq: Every day | ORAL | Status: DC | PRN

## 2023-05-30 MED ORDER — POVIDONE-IODINE 10 % EX SWAB: 2.0000 | Freq: Once | CUTANEOUS | Status: DC

## 2023-05-30 MED ORDER — ACETAMINOPHEN 325 MG PO TABS
325.0000 mg | ORAL_TABLET | Freq: Four times a day (QID) | ORAL | Status: DC | PRN
  Administered 2023-05-31 – 2023-06-01 (×2): 650 mg via ORAL
  Filled 2023-05-30 (×2): qty 2

## 2023-05-30 MED ORDER — ATORVASTATIN CALCIUM 20 MG PO TABS
20.0000 mg | ORAL_TABLET | Freq: Every day | ORAL | Status: DC
  Administered 2023-05-30 – 2023-05-31 (×2): 20 mg via ORAL
  Filled 2023-05-30 (×2): qty 1

## 2023-05-30 MED ORDER — ACETAMINOPHEN 500 MG PO TABS
1000.0000 mg | ORAL_TABLET | Freq: Once | ORAL | Status: AC
  Administered 2023-05-30: 1000 mg via ORAL
  Filled 2023-05-30: qty 2

## 2023-05-30 MED ORDER — PROPOFOL 500 MG/50ML IV EMUL
INTRAVENOUS | Status: DC | PRN
  Administered 2023-05-30: 100 ug/kg/min via INTRAVENOUS

## 2023-05-30 MED ORDER — KETOROLAC TROMETHAMINE 30 MG/ML IJ SOLN
INTRAMUSCULAR | Status: AC
Start: 2023-05-30 — End: ?
  Filled 2023-05-30: qty 1

## 2023-05-30 MED ORDER — LACTATED RINGERS IV SOLN: INTRAVENOUS | Status: DC

## 2023-05-30 MED ORDER — FENTANYL CITRATE PF 50 MCG/ML IJ SOSY
25.0000 ug | PREFILLED_SYRINGE | INTRAMUSCULAR | Status: DC | PRN
  Administered 2023-05-30 (×2): 50 ug via INTRAVENOUS

## 2023-05-30 MED ORDER — CEFAZOLIN SODIUM-DEXTROSE 2-4 GM/100ML-% IV SOLN
2.0000 g | Freq: Four times a day (QID) | INTRAVENOUS | Status: AC
  Administered 2023-05-30 – 2023-05-31 (×2): 2 g via INTRAVENOUS
  Filled 2023-05-30 (×2): qty 100

## 2023-05-30 MED ORDER — PHENOL 1.4 % MT LIQD: 1.0000 | OROMUCOSAL | Status: DC | PRN

## 2023-05-30 MED ORDER — SENNA 8.6 MG PO TABS
1.0000 | ORAL_TABLET | Freq: Two times a day (BID) | ORAL | Status: DC
  Administered 2023-05-30 – 2023-06-01 (×3): 8.6 mg via ORAL
  Filled 2023-05-30 (×4): qty 1

## 2023-05-30 MED ORDER — MENTHOL 3 MG MT LOZG: 1.0000 | LOZENGE | OROMUCOSAL | Status: DC | PRN

## 2023-05-30 MED ORDER — SERTRALINE HCL 50 MG PO TABS
150.0000 mg | ORAL_TABLET | Freq: Every day | ORAL | Status: DC
  Administered 2023-05-30 – 2023-05-31 (×2): 150 mg via ORAL
  Filled 2023-05-30 (×2): qty 1

## 2023-05-30 MED ORDER — TRANEXAMIC ACID-NACL 1000-0.7 MG/100ML-% IV SOLN
1000.0000 mg | INTRAVENOUS | Status: AC
  Administered 2023-05-30: 1000 mg via INTRAVENOUS
  Filled 2023-05-30: qty 100

## 2023-05-30 MED ORDER — ISOPROPYL ALCOHOL 70 % SOLN
Status: DC | PRN
  Administered 2023-05-30: 1 via TOPICAL

## 2023-05-30 MED ORDER — ALUM & MAG HYDROXIDE-SIMETH 200-200-20 MG/5ML PO SUSP: 30.0000 mL | ORAL | Status: DC | PRN

## 2023-05-30 MED ORDER — ALPRAZOLAM 0.5 MG PO TABS
5.0000 mg | ORAL_TABLET | Freq: Two times a day (BID) | ORAL | Status: DC
  Administered 2023-05-30: 1 mg via ORAL
  Filled 2023-05-30: qty 10

## 2023-05-30 MED ORDER — HYDROMORPHONE HCL 1 MG/ML IJ SOLN
0.5000 mg | INTRAMUSCULAR | Status: DC | PRN
Start: 2023-05-30 — End: 2023-06-01

## 2023-05-30 MED ORDER — CEFAZOLIN SODIUM-DEXTROSE 2-4 GM/100ML-% IV SOLN
2.0000 g | INTRAVENOUS | Status: AC
  Administered 2023-05-30: 2 g via INTRAVENOUS
  Filled 2023-05-30: qty 100

## 2023-05-30 MED ORDER — METHOCARBAMOL 500 MG PO TABS
500.0000 mg | ORAL_TABLET | Freq: Four times a day (QID) | ORAL | Status: DC | PRN
  Administered 2023-05-30: 500 mg via ORAL
  Filled 2023-05-30: qty 1

## 2023-05-30 MED ORDER — FENTANYL CITRATE PF 50 MCG/ML IJ SOSY
PREFILLED_SYRINGE | INTRAMUSCULAR | Status: AC
  Filled 2023-05-30: qty 2

## 2023-05-30 MED ORDER — ONDANSETRON HCL 4 MG/2ML IJ SOLN
INTRAMUSCULAR | Status: DC | PRN
  Administered 2023-05-30: 4 mg via INTRAVENOUS

## 2023-05-30 MED ORDER — BISACODYL 10 MG RE SUPP: 10.0000 mg | Freq: Every day | RECTAL | Status: DC | PRN

## 2023-05-30 MED ORDER — ASPIRIN 81 MG PO CHEW
81.0000 mg | CHEWABLE_TABLET | Freq: Two times a day (BID) | ORAL | Status: DC
  Administered 2023-05-30 – 2023-06-01 (×4): 81 mg via ORAL
  Filled 2023-05-30 (×4): qty 1

## 2023-05-30 MED ORDER — ONDANSETRON HCL 4 MG PO TABS: 4.0000 mg | ORAL_TABLET | Freq: Four times a day (QID) | ORAL | Status: DC | PRN

## 2023-05-30 MED ORDER — PANTOPRAZOLE SODIUM 40 MG PO TBEC
40.0000 mg | DELAYED_RELEASE_TABLET | Freq: Every day | ORAL | Status: DC
  Administered 2023-05-30 – 2023-06-01 (×3): 40 mg via ORAL
  Filled 2023-05-30 (×3): qty 1

## 2023-05-30 SURGICAL SUPPLY — 58 items
BAG COUNTER SPONGE SURGICOUNT (BAG) IMPLANT
BAG ZIPLOCK 12X15 (MISCELLANEOUS) IMPLANT
BATTERY INSTRU NAVIGATION (MISCELLANEOUS) ×3 IMPLANT
BLADE SAW RECIPROCATING 77.5 (BLADE) ×1 IMPLANT
BNDG ELASTIC 4INX 5YD STR LF (GAUZE/BANDAGES/DRESSINGS) ×1 IMPLANT
BNDG ELASTIC 6INX 5YD STR LF (GAUZE/BANDAGES/DRESSINGS) ×1 IMPLANT
CHLORAPREP W/TINT 26 (MISCELLANEOUS) ×2 IMPLANT
COMPONENT FEM PS KNEE NRW 6 LT (Joint) IMPLANT
COMPONENT PATELLA 3 PEG 35 (Joint) IMPLANT
COVER SURGICAL LIGHT HANDLE (MISCELLANEOUS) ×1 IMPLANT
DERMABOND ADVANCED .7 DNX12 (GAUZE/BANDAGES/DRESSINGS) ×2 IMPLANT
DRAPE SHEET LG 3/4 BI-LAMINATE (DRAPES) ×3 IMPLANT
DRAPE U-SHAPE 47X51 STRL (DRAPES) ×1 IMPLANT
DRSG AQUACEL AG ADV 3.5X10 (GAUZE/BANDAGES/DRESSINGS) ×1 IMPLANT
ELECT BLADE TIP CTD 4 INCH (ELECTRODE) ×1 IMPLANT
ELECT PENCIL ROCKER SW 15FT (MISCELLANEOUS) ×1 IMPLANT
ELECT REM PT RETURN 15FT ADLT (MISCELLANEOUS) ×1 IMPLANT
GAUZE SPONGE 4X4 12PLY STRL (GAUZE/BANDAGES/DRESSINGS) ×1 IMPLANT
GLOVE BIO SURGEON STRL SZ7 (GLOVE) ×1 IMPLANT
GLOVE BIO SURGEON STRL SZ8.5 (GLOVE) ×2 IMPLANT
GLOVE BIOGEL PI IND STRL 7.5 (GLOVE) ×1 IMPLANT
GLOVE BIOGEL PI IND STRL 8.5 (GLOVE) ×1 IMPLANT
GOWN SPEC L3 XXLG W/TWL (GOWN DISPOSABLE) ×1 IMPLANT
GOWN STRL REUS W/ TWL XL LVL3 (GOWN DISPOSABLE) ×1 IMPLANT
HOLDER FOLEY CATH W/STRAP (MISCELLANEOUS) ×1 IMPLANT
HOOD PEEL AWAY T7 (MISCELLANEOUS) ×3 IMPLANT
KIT TURNOVER KIT A (KITS) IMPLANT
LINER TIB PS CD/3-9 10 LT (Liner) IMPLANT
MARKER SKIN DUAL TIP RULER LAB (MISCELLANEOUS) ×1 IMPLANT
NDL SAFETY ECLIPSE 18X1.5 (NEEDLE) ×1 IMPLANT
NDL SPNL 18GX3.5 QUINCKE PK (NEEDLE) ×1 IMPLANT
NEEDLE SPNL 18GX3.5 QUINCKE PK (NEEDLE) ×1 IMPLANT
NS IRRIG 1000ML POUR BTL (IV SOLUTION) ×1 IMPLANT
PACK TOTAL KNEE CUSTOM (KITS) ×1 IMPLANT
PADDING CAST COTTON 6X4 STRL (CAST SUPPLIES) ×1 IMPLANT
PIN DRILL HDLS TROCAR 75 4PK (PIN) IMPLANT
PROTECTOR NERVE ULNAR (MISCELLANEOUS) ×1 IMPLANT
SAW OSC TIP CART 19.5X105X1.3 (SAW) ×1 IMPLANT
SCREW FEMALE HEX FIX 25X2.5 (ORTHOPEDIC DISPOSABLE SUPPLIES) IMPLANT
SEALER BIPOLAR AQUA 6.0 (INSTRUMENTS) ×1 IMPLANT
SET HNDPC FAN SPRY TIP SCT (DISPOSABLE) ×1 IMPLANT
SET PAD KNEE POSITIONER (MISCELLANEOUS) ×1 IMPLANT
SOLUTION PRONTOSAN WOUND 350ML (IRRIGATION / IRRIGATOR) ×1 IMPLANT
SPIKE FLUID TRANSFER (MISCELLANEOUS) ×2 IMPLANT
STAPLER SKIN PROX 35W (STAPLE) IMPLANT
STEM TIB PS KNEE D 0D LT (Stem) IMPLANT
SUT MNCRL AB 3-0 PS2 18 (SUTURE) ×1 IMPLANT
SUT MON AB 2-0 CT1 36 (SUTURE) ×1 IMPLANT
SUT STRATAFIX 14 PDO 48 VLT (SUTURE) ×1 IMPLANT
SUT STRATAFIX PDO 1 14 VIOLET (SUTURE) ×1 IMPLANT
SUT VIC AB 1 CTX36XBRD ANBCTR (SUTURE) ×2 IMPLANT
SUT VIC AB 2-0 CT1 TAPERPNT 27 (SUTURE) ×1 IMPLANT
SYR 3ML LL SCALE MARK (SYRINGE) ×1 IMPLANT
TOWEL GREEN STERILE FF (TOWEL DISPOSABLE) ×1 IMPLANT
TRAY FOLEY MTR SLVR 16FR STAT (SET/KITS/TRAYS/PACK) IMPLANT
TUBE SUCTION HIGH CAP CLEAR NV (SUCTIONS) ×1 IMPLANT
WATER STERILE IRR 1000ML POUR (IV SOLUTION) ×2 IMPLANT
WRAP KNEE MAXI GEL POST OP (GAUZE/BANDAGES/DRESSINGS) IMPLANT

## 2023-05-30 NOTE — Discharge Instructions (Signed)

## 2023-05-30 NOTE — Anesthesia Procedure Notes (Signed)
 Spinal  Patient location during procedure: OR Start time: 05/30/2023 3:19 PM End time: 05/30/2023 3:23 PM Reason for block: surgical anesthesia Staffing Performed: anesthesiologist  Anesthesiologist: Arvie Latus, MD Performed by: Arvie Latus, MD Authorized by: Arvie Latus, MD   Preanesthetic Checklist Completed: patient identified, IV checked, risks and benefits discussed, surgical consent, monitors and equipment checked, pre-op evaluation and timeout performed Spinal Block Patient position: sitting Prep: DuraPrep Patient monitoring: heart rate, cardiac monitor, continuous pulse ox and blood pressure Approach: midline Location: L4-5 Injection technique: single-shot Needle Needle type: Pencan  Needle gauge: 24 G Needle length: 9 cm Assessment Sensory level: T6 Events: CSF return

## 2023-05-30 NOTE — Interval H&P Note (Signed)
 History and Physical Interval Note:  05/30/2023 3:53 PM  Linda Page  has presented today for surgery, with the diagnosis of Left knee osteoarthrtisi.  The various methods of treatment have been discussed with the patient and family. After consideration of risks, benefits and other options for treatment, the patient has consented to  Procedure(s): ARTHROPLASTY, KNEE, TOTAL, USING IMAGELESS COMPUTER-ASSISTED NAVIGATION (Left) as a surgical intervention.  The patient's history has been reviewed, patient examined, no change in status, stable for surgery.  I have reviewed the patient's chart and labs.  Questions were answered to the patient's satisfaction.    The risks, benefits, and alternatives were discussed with the patient. There are risks associated with the surgery including, but not limited to, problems with anesthesia (death), infection, instability (giving out of the joint), dislocation, differences in leg length/angulation/rotation, fracture of bones, loosening or failure of implants, hematoma (blood accumulation) which may require surgical drainage, blood clots, pulmonary embolism, nerve injury (foot drop and lateral thigh numbness), and blood vessel injury. The patient understands these risks and elects to proceed.    Margart Shears Santanna Whitford

## 2023-05-30 NOTE — Plan of Care (Signed)
  Problem: Education: Goal: Knowledge of General Education information will improve Description: Including pain rating scale, medication(s)/side effects and non-pharmacologic comfort measures Outcome: Progressing   Problem: Health Behavior/Discharge Planning: Goal: Ability to manage health-related needs will improve Outcome: Progressing   Problem: Clinical Measurements: Goal: Ability to maintain clinical measurements within normal limits will improve Outcome: Progressing Goal: Will remain free from infection Outcome: Progressing Goal: Diagnostic test results will improve Outcome: Progressing Goal: Respiratory complications will improve Outcome: Progressing Goal: Cardiovascular complication will be avoided Outcome: Progressing   Problem: Activity: Goal: Risk for activity intolerance will decrease Outcome: Progressing   Problem: Nutrition: Goal: Adequate nutrition will be maintained Outcome: Progressing   Problem: Coping: Goal: Level of anxiety will decrease Outcome: Progressing   Problem: Elimination: Goal: Will not experience complications related to bowel motility Outcome: Progressing Goal: Will not experience complications related to urinary retention Outcome: Progressing   Problem: Pain Managment: Goal: General experience of comfort will improve and/or be controlled Outcome: Progressing   Problem: Safety: Goal: Ability to remain free from injury will improve Outcome: Progressing   Problem: Skin Integrity: Goal: Risk for impaired skin integrity will decrease Outcome: Progressing   Problem: Education: Goal: Knowledge of the prescribed therapeutic regimen will improve Outcome: Progressing Goal: Individualized Educational Video(s) Outcome: Completed/Met   Problem: Activity: Goal: Ability to avoid complications of mobility impairment will improve Outcome: Progressing Goal: Range of joint motion will improve Outcome: Progressing   Problem: Clinical  Measurements: Goal: Postoperative complications will be avoided or minimized Outcome: Progressing   Problem: Pain Management: Goal: Pain level will decrease with appropriate interventions Outcome: Progressing   Problem: Skin Integrity: Goal: Will show signs of wound healing Outcome: Progressing

## 2023-05-30 NOTE — Transfer of Care (Signed)
 Immediate Anesthesia Transfer of Care Note  Patient: Linda Page  Procedure(s) Performed: ARTHROPLASTY, KNEE, TOTAL, USING IMAGELESS COMPUTER-ASSISTED NAVIGATION (Left: Knee)  Patient Location: PACU  Anesthesia Type:Spinal  Level of Consciousness: awake and alert   Airway & Oxygen Therapy: Patient Spontanous Breathing  Post-op Assessment: Report given to RN and Post -op Vital signs reviewed and stable  Post vital signs: Reviewed and stable  Last Vitals:  Vitals Value Taken Time  BP 99/68 05/30/23 1834  Temp    Pulse 102 05/30/23 1836  Resp 17 05/30/23 1836  SpO2 94 % 05/30/23 1836  Vitals shown include unfiled device data.  Last Pain:  Vitals:   05/30/23 1244  TempSrc:   PainSc: 0-No pain         Complications: No notable events documented.

## 2023-05-30 NOTE — Care Plan (Addendum)
 Ortho Bundle Case Management Note  Patient Details  Name: LATIKA KRONICK MRN: [id number] Date of Birth: 1950-12-11                  L TKA on 05/30/23  DCP: Home with daughter.  DME: No needs, has RW.  PT: Lucrecia Sables 5/27   DME Arranged:  N/A DME Agency:       Additional Comments: Please contact me with any questions of if this plan should need to change.    Kathlene Paradise, Case Manager [facility]  [phone] 05/30/2023, 1:09 PM

## 2023-05-30 NOTE — Anesthesia Preprocedure Evaluation (Signed)
 Anesthesia Evaluation  Patient identified by MRN, date of birth, ID band Patient awake    Reviewed: Allergy & Precautions, NPO status , Patient's Chart, lab work & pertinent test results  History of Anesthesia Complications Negative for: history of anesthetic complications  Airway Mallampati: IV  TM Distance: <3 FB Neck ROM: Limited    Dental  (+) Teeth Intact, Dental Advisory Given   Pulmonary neg shortness of breath, neg sleep apnea, neg COPD, neg recent URI   breath sounds clear to auscultation       Cardiovascular hypertension, Pt. on medications (-) angina (-) Past MI and (-) CHF  Rhythm:Regular     Neuro/Psych  PSYCHIATRIC DISORDERS Anxiety Depression    Previous cervical sx    GI/Hepatic Neg liver ROS, PUD,,,  Endo/Other  negative endocrine ROS  No results found for: "HGBA1C"   Renal/GU negative Renal ROSLab Results      Component                Value               Date                      NA                       138                 05/23/2023                K                        4.4                 05/23/2023                CO2                      26                  05/23/2023                GLUCOSE                  103 (H)             05/23/2023                BUN                      23                  05/23/2023                CREATININE               0.71                05/23/2023                CALCIUM                   9.5                 05/23/2023                GFRNONAA                 >60  05/23/2023                Musculoskeletal  (+) Arthritis ,    Abdominal   Peds  Hematology negative hematology ROS (+) Lab Results      Component                Value               Date                      WBC                      8.3                 05/23/2023                HGB                      12.6                05/23/2023                HCT                      40.1                 05/23/2023                MCV                      94.8                05/23/2023                PLT                      294                 05/23/2023              Anesthesia Other Findings   Reproductive/Obstetrics                              Anesthesia Physical Anesthesia Plan  ASA: 2  Anesthesia Plan: Regional, MAC and Spinal   Post-op Pain Management: Regional block*   Induction: Intravenous  PONV Risk Score and Plan: 2 and Propofol  infusion and Treatment may vary due to age or medical condition  Airway Management Planned: Nasal Cannula, Natural Airway and Simple Face Mask  Additional Equipment: None  Intra-op Plan:   Post-operative Plan:   Informed Consent: I have reviewed the patients History and Physical, chart, labs and discussed the procedure including the risks, benefits and alternatives for the proposed anesthesia with the patient or authorized representative who has indicated his/her understanding and acceptance.     Dental advisory given  Plan Discussed with: CRNA  Anesthesia Plan Comments:          Anesthesia Quick Evaluation

## 2023-05-30 NOTE — Interval H&P Note (Signed)
 History and Physical Interval Note:  05/30/2023 12:20 PM  Linda Page  has presented today for surgery, with the diagnosis of Left knee osteoarthrtisi.  The various methods of treatment have been discussed with the patient and family. After consideration of risks, benefits and other options for treatment, the patient has consented to  Procedure(s): ARTHROPLASTY, KNEE, TOTAL, USING IMAGELESS COMPUTER-ASSISTED NAVIGATION (Left) as a surgical intervention.  The patient's history has been reviewed, patient examined, no change in status, stable for surgery.  I have reviewed the patient's chart and labs.  Questions were answered to the patient's satisfaction.     Margart Shears Dmitriy Gair

## 2023-05-30 NOTE — Op Note (Signed)
 OPERATIVE REPORT  SURGEON: Adonica Hoose, MD   ASSISTANT: Trixie Furnace, PA-C  PREOPERATIVE DIAGNOSIS: Primary Left knee arthritis.   POSTOPERATIVE DIAGNOSIS: Primary Left knee arthritis.   PROCEDURE: Computer assisted Left total knee arthroplasty.   IMPLANTS: Zimmer Persona PPS Cementless CR femur, size 6 Narrow. Persona 0 degree Spiked Keel OsseoTi Tibia, size D. Vivacit-E polyethelyene insert, size 10 mm, CR. OsseoTi 3-Peg patella, size 35 mm.  ANESTHESIA:  MAC, Regional, and Spinal  TOURNIQUET TIME: Not utilized.   ESTIMATED BLOOD LOSS:-101 mL    ANTIBIOTICS: 2g Ancef .  DRAINS: None.  COMPLICATIONS: None   CONDITION: PACU - hemodynamically stable.   BRIEF CLINICAL NOTE: Linda Page is a 73 y.o. female with a long-standing history of Left knee arthritis. After failing conservative management, the patient was indicated for total knee arthroplasty. The risks, benefits, and alternatives to the procedure were explained, and the patient elected to proceed.  PROCEDURE IN DETAIL: Adductor canal block was obtained in the pre-op holding area. Once inside the operative room, spinal anesthesia was obtained, and a foley catheter was inserted. The patient was then positioned and the lower extremity was prepped and draped in the normal sterile surgical fashion.  A time-out was called verifying side and site of surgery. The patient received IV antibiotics within 60 minutes of beginning the procedure. A tourniquet was not utilized.   An anterior approach to the knee was performed utilizing a midvastus arthrotomy. A medial release was performed and the patellar fat pad was excised. Stryker imageless navigation was used to cut the distal femur perpendicular to the mechanical axis. A freehand patellar resection was performed, and the patella was sized an prepared with 3 lug holes.  Nagivation was used to make a neutral proximal tibia resection, taking 10 mm of bone from the less affected  lateral side with 3 degrees of slope. The menisci were excised. A spacer block was placed, and the alignment and balance in extension were confirmed.   The distal femur was sized using the 3-degree external rotation guide referencing the posterior femoral cortex. The appropriate 4-in-1 cutting block was pinned into place. Rotation was checked using Whiteside's line, the epicondylar axis, and then confirmed with a spacer block in flexion. The remaining femoral cuts were performed, taking care to protect the MCL.  The tibia was sized and the trial tray was pinned into place. The remaining trail components were inserted. The knee was stable to varus and valgus stress through a full range of motion. The patella tracked centrally, and the PCL was well balanced. The trial components were removed, and the proximal tibial surface was prepared. Final components were impacted into place. The knee was tested for a final time and found to be well balanced.   The wound was copiously irrigated with Prontosan solution and normal saline using pulse lavage.  Marcaine  solution was injected into the periarticular soft tissue.  The wound was closed in layers using #1 Vicryl and Stratafix for the fascia, 2-0 Vicryl for the subcutaneous fat, 2-0 Monocryl for the deep dermal layer, 3-0 running Monocryl subcuticular Stitch, and 4-0 Monocryl stay sutures at both ends of the wound. Dermabond was applied to the skin.  Once the glue was fully dried, an Aquacell Ag and compressive dressing were applied.  The patient was transported to the recovery room in stable condition.  Sponge, needle, and instrument counts were correct at the end of the case x2.  The patient tolerated the procedure well and there were no known  complications.  The aquamantis was utilized for this case to help facilitate better hemostasis as patient was felt to be at increased risk of bleeding because of complex case requiring increased OR time and/or exposure.   -minimally invasive approach.  A oscillating saw tip was utilized for this case to prevent damage to the soft tissue structures such as muscles, ligaments and tendons, and to ensure accurate bone cuts. This patient was at increased risk for above structures due to  minimally invasive approach.  Please note that a surgical assistant was a medical necessity for this procedure in order to perform it in a safe and expeditious manner. Surgical assistant was necessary to retract the ligaments and vital neurovascular structures to prevent injury to them and also necessary for proper positioning of the limb to allow for anatomic placement of the prosthesis.

## 2023-05-30 NOTE — Anesthesia Procedure Notes (Signed)
 Anesthesia Regional Block: Adductor canal block   Pre-Anesthetic Checklist: , timeout performed,  Correct Patient, Correct Site, Correct Laterality,  Correct Procedure, Correct Position, site marked,  Risks and benefits discussed,  Surgical consent,  Pre-op evaluation,  At surgeon's request and post-op pain management  Laterality: Left and Lower  Prep: chloraprep       Needles:  Injection technique: Single-shot      Needle Length: 9cm  Needle Gauge: 22     Additional Needles: Arrow StimuQuik ECHO Echogenic Stimulating PNB Needle  Procedures:,,,, ultrasound used (permanent image in chart),,    Narrative:  Start time: 05/30/2023 2:46 PM End time: 05/30/2023 2:52 PM Injection made incrementally with aspirations every 5 mL.  Performed by: Personally  Anesthesiologist: Arvie Latus, MD

## 2023-05-31 ENCOUNTER — Encounter (HOSPITAL_COMMUNITY): Payer: Self-pay | Admitting: Orthopedic Surgery

## 2023-05-31 ENCOUNTER — Other Ambulatory Visit (HOSPITAL_COMMUNITY): Payer: Self-pay

## 2023-05-31 DIAGNOSIS — Z885 Allergy status to narcotic agent status: Secondary | ICD-10-CM | POA: Diagnosis not present

## 2023-05-31 DIAGNOSIS — Z841 Family history of disorders of kidney and ureter: Secondary | ICD-10-CM | POA: Diagnosis not present

## 2023-05-31 DIAGNOSIS — Z9842 Cataract extraction status, left eye: Secondary | ICD-10-CM | POA: Diagnosis not present

## 2023-05-31 DIAGNOSIS — Z9841 Cataract extraction status, right eye: Secondary | ICD-10-CM | POA: Diagnosis not present

## 2023-05-31 DIAGNOSIS — F32A Depression, unspecified: Secondary | ICD-10-CM | POA: Diagnosis present

## 2023-05-31 DIAGNOSIS — M25562 Pain in left knee: Secondary | ICD-10-CM | POA: Diagnosis present

## 2023-05-31 DIAGNOSIS — Z79899 Other long term (current) drug therapy: Secondary | ICD-10-CM | POA: Diagnosis not present

## 2023-05-31 DIAGNOSIS — Z96651 Presence of right artificial knee joint: Secondary | ICD-10-CM | POA: Diagnosis present

## 2023-05-31 DIAGNOSIS — M1712 Unilateral primary osteoarthritis, left knee: Secondary | ICD-10-CM | POA: Diagnosis present

## 2023-05-31 DIAGNOSIS — D62 Acute posthemorrhagic anemia: Secondary | ICD-10-CM | POA: Diagnosis not present

## 2023-05-31 DIAGNOSIS — I10 Essential (primary) hypertension: Secondary | ICD-10-CM | POA: Diagnosis present

## 2023-05-31 DIAGNOSIS — Z981 Arthrodesis status: Secondary | ICD-10-CM | POA: Diagnosis not present

## 2023-05-31 DIAGNOSIS — R7303 Prediabetes: Secondary | ICD-10-CM | POA: Diagnosis present

## 2023-05-31 DIAGNOSIS — Z825 Family history of asthma and other chronic lower respiratory diseases: Secondary | ICD-10-CM | POA: Diagnosis not present

## 2023-05-31 DIAGNOSIS — I951 Orthostatic hypotension: Secondary | ICD-10-CM | POA: Diagnosis present

## 2023-05-31 DIAGNOSIS — Z820 Family history of epilepsy and other diseases of the nervous system: Secondary | ICD-10-CM | POA: Diagnosis not present

## 2023-05-31 LAB — BASIC METABOLIC PANEL WITH GFR
Anion gap: 9 (ref 5–15)
BUN: 22 mg/dL (ref 8–23)
CO2: 27 mmol/L (ref 22–32)
Calcium: 9.4 mg/dL (ref 8.9–10.3)
Chloride: 99 mmol/L (ref 98–111)
Creatinine, Ser: 0.8 mg/dL (ref 0.44–1.00)
GFR, Estimated: >60 mL/min (ref >60)
Glucose, Bld: 142 mg/dL — ABNORMAL HIGH (ref 70–99)
Potassium: 4.6 mmol/L (ref 3.5–5.1)
Sodium: 135 mmol/L (ref 135–145)

## 2023-05-31 LAB — CBC
HCT: 37.3 % (ref 36.0–46.0)
Hemoglobin: 11.8 g/dL — ABNORMAL LOW (ref 12.0–15.0)
MCH: 30.4 pg (ref 26.0–34.0)
MCHC: 31.6 g/dL (ref 30.0–36.0)
MCV: 96.1 fL (ref 80.0–100.0)
Platelets: 283 10*3/uL (ref 150–400)
RBC: 3.88 MIL/uL (ref 3.87–5.11)
RDW: 12.6 % (ref 11.5–15.5)
WBC: 15.8 10*3/uL — ABNORMAL HIGH (ref 4.0–10.5)
nRBC: 0 % (ref 0.0–0.2)

## 2023-05-31 MED ORDER — ACETAMINOPHEN 500 MG PO TABS
1000.0000 mg | ORAL_TABLET | Freq: Three times a day (TID) | ORAL | 0 refills | Status: DC | PRN
  Filled 2023-05-31: qty 30, 5d supply, fill #0

## 2023-05-31 MED ORDER — ONDANSETRON HCL 4 MG PO TABS
4.0000 mg | ORAL_TABLET | Freq: Three times a day (TID) | ORAL | 0 refills | Status: DC | PRN
  Filled 2023-05-31: qty 30, 10d supply, fill #0

## 2023-05-31 MED ORDER — POLYETHYLENE GLYCOL 3350 17 GM/SCOOP PO POWD
17.0000 g | Freq: Every day | ORAL | 0 refills | Status: AC | PRN
  Filled 2023-05-31: qty 238, 14d supply, fill #0

## 2023-05-31 MED ORDER — DOCUSATE SODIUM 100 MG PO CAPS
100.0000 mg | ORAL_CAPSULE | Freq: Two times a day (BID) | ORAL | 0 refills | Status: AC
  Filled 2023-05-31: qty 60, 30d supply, fill #0

## 2023-05-31 MED ORDER — OXYCODONE HCL 5 MG PO TABS
5.0000 mg | ORAL_TABLET | ORAL | 0 refills | Status: DC | PRN
  Filled 2023-05-31: qty 40, 7d supply, fill #0

## 2023-05-31 MED ORDER — SENNA 8.6 MG PO TABS
2.0000 | ORAL_TABLET | Freq: Every day | ORAL | 0 refills | Status: AC
  Filled 2023-05-31: qty 30, 15d supply, fill #0

## 2023-05-31 MED ORDER — ALPRAZOLAM 0.5 MG PO TABS
0.5000 mg | ORAL_TABLET | Freq: Two times a day (BID) | ORAL | Status: DC | PRN
  Administered 2023-05-31: 1 mg via ORAL
  Filled 2023-05-31: qty 2

## 2023-05-31 MED ORDER — ASPIRIN 81 MG PO CHEW
81.0000 mg | CHEWABLE_TABLET | Freq: Two times a day (BID) | ORAL | 0 refills | Status: AC
  Filled 2023-05-31: qty 90, 45d supply, fill #0

## 2023-05-31 NOTE — Evaluation (Signed)
 Physical Therapy Evaluation Patient Details Name: Linda Page MRN: [id number] DOB: 1950/05/26 Today's Date: 05/31/2023  History of Present Illness  Pt s/p L TKR and with hx of R TKR and cervical fusion  Clinical Impression  Pt s/p L TKR and presents with decreased L LE strength/ROM and post op pain limiting functional mobility.  Pt should progress to dc home with family assist and reports first OP PT scheduled for 06/05/23        If plan is discharge home, recommend the following: A little help with walking and/or transfers;A little help with bathing/dressing/bathroom;Assistance with cooking/housework;Assist for transportation;Help with stairs or ramp for entrance   Can travel by private vehicle        Equipment Recommendations None recommended by PT  Recommendations for Other Services       Functional Status Assessment Patient has had a recent decline in their functional status and demonstrates the ability to make significant improvements in function in a reasonable and predictable amount of time.     Precautions / Restrictions Precautions Precautions: Knee;Fall Restrictions Weight Bearing Restrictions Per Provider Order: No Other Position/Activity Restrictions: WBAT      Mobility  Bed Mobility Overal bed mobility: Needs Assistance Bed Mobility: Supine to Sit     Supine to sit: Min assist     General bed mobility comments: cues for sequence and use of R LE to self assist    Transfers Overall transfer level: Needs assistance Equipment used: Rolling walker (2 wheels) Transfers: Sit to/from Stand Sit to Stand: Min assist           General transfer comment: cues for LE management and use of UEs to self assist    Ambulation/Gait Ambulation/Gait assistance: Min assist Gait Distance (Feet): 60 Feet Assistive device: Rolling walker (2 wheels) Gait Pattern/deviations: Step-to pattern, Decreased step length - right, Decreased step length - left, Shuffle, Trunk  flexed Gait velocity: decr     General Gait Details: cues for sequence, posture and position from [employer]            Wheelchair Mobility     Tilt Bed    Modified Rankin (Stroke Patients Only)       Balance Overall balance assessment: Needs assistance Sitting-balance support: No upper extremity supported, Feet supported Sitting balance-Leahy Scale: Good     Standing balance support: Bilateral upper extremity supported Standing balance-Leahy Scale: Poor                               Pertinent Vitals/Pain      Home Living Family/patient expects to be discharged to:: Private residence Living Arrangements: Children Available Help at Discharge: Family;Available 24 hours/day Type of Home: House Home Access: Stairs to enter Entrance Stairs-Rails: None Entrance Stairs-Number of Steps: 1   Home Layout: One level Home Equipment: Grab bars - tub/shower Additional Comments: pt's daughter lives with her    Prior Function Prior Level of Function : Independent/Modified Independent             Mobility Comments: using RW 2* knee pain       Extremity/Trunk Assessment   Upper Extremity Assessment Upper Extremity Assessment: Overall WFL for tasks assessed    Lower Extremity Assessment Lower Extremity Assessment: LLE deficits/detail LLE Deficits / Details: AAROM at knee -5 -40 with 3-/5 quad strength    Cervical / Trunk Assessment Cervical / Trunk Assessment: Normal  Communication   Communication Communication:  No apparent difficulties    Cognition Arousal: Alert Behavior During Therapy: WFL for tasks assessed/performed   PT - Cognitive impairments: No apparent impairments                         Following commands: Intact       Cueing Cueing Techniques: Verbal cues     General Comments      Exercises Total Joint Exercises Ankle Circles/Pumps: AROM, Both, 15 reps, Supine Quad Sets: AROM, Both, 10 reps, Supine Heel  Slides: AAROM, Left, 15 reps, Supine Straight Leg Raises: AAROM, AROM, Left, 10 reps, Supine Long Arc Quad: AAROM, Left, 10 reps, Seated   Assessment/Plan    PT Assessment Patient needs continued PT services  PT Problem List Decreased strength;Decreased range of motion;Decreased activity tolerance;Decreased balance;Decreased mobility;Decreased knowledge of use of DME;Pain       PT Treatment Interventions DME instruction;Gait training;Stair training;Functional mobility training;Therapeutic activities;Therapeutic exercise;Patient/family education    PT Goals (Current goals can be found in the Care Plan section)  Acute Rehab PT Goals Patient Stated Goal: Regain IND PT Goal Formulation: With patient Time For Goal Achievement: 06/07/23 Potential to Achieve Goals: Good    Frequency 7X/week     Co-evaluation               AM-PAC PT "6 Clicks" Mobility  Outcome Measure Help needed turning from your back to your side while in a flat bed without using bedrails?: A Little Help needed moving from lying on your back to sitting on the side of a flat bed without using bedrails?: A Little Help needed moving to and from a bed to a chair (including a wheelchair)?: A Little Help needed standing up from a chair using your arms (e.g., wheelchair or bedside chair)?: A Little Help needed to walk in hospital room?: A Little Help needed climbing 3-5 steps with a railing? : A Lot 6 Click Score: 17    End of Session Equipment Utilized During Treatment: Gait belt Activity Tolerance: Patient tolerated treatment well Patient left: in chair;with call bell/phone within reach;with chair alarm set Nurse Communication: Mobility status PT Visit Diagnosis: Unsteadiness on feet (R26.81);Difficulty in walking, not elsewhere classified (R26.2)    Time: 1478-2956 PT Time Calculation (min) (ACUTE ONLY): 33 min   Charges:   PT Evaluation $PT Eval Low Complexity: 1 Low PT Treatments $Therapeutic  Exercise: 8-22 mins PT General Charges $$ ACUTE PT VISIT: 1 Visit         Thedora Finlay PT Acute Rehabilitation Services Pager [phone] Office [phone]   [facility] 05/31/2023, 12:47 PM

## 2023-05-31 NOTE — Progress Notes (Signed)
    Subjective:  Patient reports pain as mild to moderate.  Denies N/V/CP/SOB/Abd pain. She denies any tingling or numbness in LE bilaterally.   Objective:   VITALS:   Vitals:   05/30/23 2015 05/30/23 2205 05/31/23 0140 05/31/23 0451  BP: (!) 156/77 133/79 106/62 104/61  Pulse: 96 (!) 101 90 90  Resp: 17 17 17 17   Temp: 97.7 F (36.5 C) 97.7 F (36.5 C) 98.1 F (36.7 C) 97.8 F (36.6 C)  TempSrc: Oral Oral Oral Oral  SpO2: 90% 92% 92% 92%  Weight:      Height:        NAD Neurologically intact ABD soft Neurovascular intact Sensation intact distally Intact pulses distally Dorsiflexion/Plantar flexion intact Incision: dressing C/D/I No cellulitis present Compartment soft   Lab Results  Component Value Date   WBC 15.8 (H) 05/31/2023   HGB 11.8 (L) 05/31/2023   HCT 37.3 05/31/2023   MCV 96.1 05/31/2023   PLT 283 05/31/2023   BMET    Component Value Date/Time   NA 135 05/31/2023 0328   K 4.6 05/31/2023 0328   CL 99 05/31/2023 0328   CO2 27 05/31/2023 0328   GLUCOSE 142 (H) 05/31/2023 0328   BUN 22 05/31/2023 0328   CREATININE 0.80 05/31/2023 0328   CALCIUM  9.4 05/31/2023 0328   GFRNONAA >60 05/31/2023 0328     Assessment/Plan: 1 Day Post-Op   Principal Problem:   Degenerative arthritis of left knee Active Problems:   S/P total knee arthroplasty, left  ABLA. Hemoglobin 11.8. Continue to monitor.   WBAT with walker DVT ppx: Aspirin , SCDs, TEDS PO pain control PT/OT: To come today.  Dispo:  - D/c home with OPPT once cleared with PT.    Harman Lightning 05/31/2023, 9:23 AM   [facility]  [place] [address], [address], [place], [place] [address] Phone: [phone] [email or web address] Facebook  [employer]

## 2023-05-31 NOTE — Progress Notes (Signed)
 Discharge instructions given to patient, Questions asked and answered, Discharge medications delivered to patient at bedside [facility]

## 2023-05-31 NOTE — TOC Transition Note (Signed)
 Transition of Care [facility]) - Discharge Note   Patient Details  Name: Linda Page MRN: [id number] Date of Birth: August 07, 1950  Transition of [facility]) CM/SW Contact:  Delilah Fend, LCSW Phone Number: 05/31/2023, 9:31 AM   Clinical Narrative:     Met with pt who confirms she has needed DME in the home.  OPPT already arranged with [facility].  No further TOC needs.  Final next level of care: OP Rehab Barriers to Discharge: No Barriers Identified   Patient Goals and CMS Choice Patient states their goals for this hospitalization and ongoing recovery are:: return home          Discharge Placement                       Discharge Plan and Services Additional resources added to the After Visit Summary for                  DME Arranged: N/A DME Agency: NA                  Social Drivers of Health (SDOH) Interventions SDOH Screenings   Food Insecurity: No Food Insecurity (05/30/2023)  Housing: Low Risk  (05/30/2023)  Transportation Needs: No Transportation Needs (05/30/2023)  Utilities: Not At Risk (05/30/2023)  Alcohol  Screen: Low Risk  (08/22/2022)  Depression (PHQ2-9): Low Risk  (08/22/2022)  Financial Resource Strain: Low Risk  (08/22/2022)  Physical Activity: Insufficiently Active (08/22/2022)  Social Connections: Moderately Integrated (05/30/2023)  Stress: No Stress Concern Present (08/22/2022)  Tobacco Use: Low Risk  (05/30/2023)     Readmission Risk Interventions     No data to display

## 2023-05-31 NOTE — Progress Notes (Signed)
 pt c/o dizziness and lightheadedness with activity. Orthostatic BP collected laying 138/64, HR 90 ;Sitting 139/82, HR 98 ;Standing 122/86, after 2 minutes standing 114/71, HR 100. O2 sat 89% in room air. started on 2L O2 via [place] went up to 97%. PA notified. Started on NS 75ml/hr. Encouraged fluid intake and to use incentive spirometry q 1 hr. Call bell in reach. Bed low and locked. Will continue to monitor.

## 2023-05-31 NOTE — Progress Notes (Signed)
 Physical Therapy Treatment Patient Details Name: Linda Page MRN: [id number] DOB: March 14, 1950 Today's Date: 05/31/2023   History of Present Illness Pt s/p L TKR and with hx of R TKR and cervical fusion    PT Comments  Pt continues very cooperative but limited this date by c/o lightheadedness - RN in to assess and pt found to  be orthostatic with O2 on RA ~ 90%.  Pt assisted to bed and positioned for comfort with RN in to re-assess.    If plan is discharge home, recommend the following: A little help with walking and/or transfers;A little help with bathing/dressing/bathroom;Assistance with cooking/housework;Assist for transportation;Help with stairs or ramp for entrance   Can travel by private vehicle        Equipment Recommendations  None recommended by PT    Recommendations for Other Services       Precautions / Restrictions Precautions Precautions: Knee;Fall Restrictions Weight Bearing Restrictions Per Provider Order: No Other Position/Activity Restrictions: WBAT     Mobility  Bed Mobility Overal bed mobility: Needs Assistance Bed Mobility: Sit to Supine     Supine to sit: Min assist Sit to supine: Min assist   General bed mobility comments: cues for sequence and use of R LE to self assist    Transfers Overall transfer level: Needs assistance Equipment used: Rolling walker (2 wheels) Transfers: Sit to/from Stand, Bed to chair/wheelchair/BSC Sit to Stand: Min assist   Step pivot transfers: Min assist       General transfer comment: cues for LE management and use of UEs to self assist; step pvt BSC to bedside    Ambulation/Gait Ambulation/Gait assistance: Min assist Gait Distance (Feet): 5 Feet Assistive device: Rolling walker (2 wheels) Gait Pattern/deviations: Step-to pattern, Decreased step length - right, Decreased step length - left, Shuffle, Trunk flexed Gait velocity: decr     General Gait Details: cues for sequence, posture and position from  [employer]             Wheelchair Mobility     Tilt Bed    Modified Rankin (Stroke Patients Only)       Balance Overall balance assessment: Needs assistance Sitting-balance support: No upper extremity supported, Feet supported Sitting balance-Leahy Scale: Good     Standing balance support: No upper extremity supported Standing balance-Leahy Scale: Fair                              Hotel manager: No apparent difficulties  Cognition Arousal: Alert Behavior During Therapy: WFL for tasks assessed/performed   PT - Cognitive impairments: No apparent impairments                         Following commands: Intact      Cueing Cueing Techniques: Verbal cues  Exercises Total Joint Exercises Ankle Circles/Pumps: AROM, Both, 15 reps, Supine Quad Sets: AROM, Both, 10 reps, Supine Heel Slides: AAROM, Left, 15 reps, Supine Straight Leg Raises: AAROM, AROM, Left, 10 reps, Supine Long Arc Quad: AAROM, Left, 10 reps, Seated    General Comments        Pertinent Vitals/Pain      Home Living Family/patient expects to be discharged to:: Private residence Living Arrangements: Children Available Help at Discharge: Family;Available 24 hours/day Type of Home: House Home Access: Stairs to enter Entrance Stairs-Rails: None Entrance Stairs-Number of Steps: 1   Home Layout: One level Home Equipment:  Grab bars - tub/shower Additional Comments: pt's daughter lives with her    Prior Function            PT Goals (current goals can now be found in the care plan section) Acute Rehab PT Goals Patient Stated Goal: Regain IND PT Goal Formulation: With patient Time For Goal Achievement: 06/07/23 Potential to Achieve Goals: Good Progress towards PT goals: Not progressing toward goals - comment (orthostatic)    Frequency    7X/week      PT Plan      Co-evaluation              AM-PAC PT "6 Clicks" Mobility    Outcome Measure  Help needed turning from your back to your side while in a flat bed without using bedrails?: A Little Help needed moving from lying on your back to sitting on the side of a flat bed without using bedrails?: A Little Help needed moving to and from a bed to a chair (including a wheelchair)?: A Little Help needed standing up from a chair using your arms (e.g., wheelchair or bedside chair)?: A Little Help needed to walk in hospital room?: A Little Help needed climbing 3-5 steps with a railing? : A Lot 6 Click Score: 17    End of Session Equipment Utilized During Treatment: Gait belt Activity Tolerance: Other (comment) (orthostatic) Patient left: in bed;with call bell/phone within reach;with bed alarm set;with nursing/sitter in room Nurse Communication: Mobility status PT Visit Diagnosis: Unsteadiness on feet (R26.81);Difficulty in walking, not elsewhere classified (R26.2)     Time: 1610-9604 PT Time Calculation (min) (ACUTE ONLY): 19 min  Charges:    $Therapeutic Exercise: 8-22 mins $Therapeutic Activity: 8-22 mins PT General Charges $$ ACUTE PT VISIT: 1 Visit                     Thedora Finlay PT Acute Rehabilitation Services Pager [phone] Office [phone]    [name] 05/31/2023, 2:32 PM

## 2023-05-31 NOTE — Progress Notes (Signed)
 TOC meds in a secure bag given to pt's daughter per pt request. Pt's daughter will take meds home today when she leaves. Primary nurse updated by this RN.

## 2023-06-01 LAB — BASIC METABOLIC PANEL WITH GFR
Anion gap: 8 (ref 5–15)
BUN: 27 mg/dL — ABNORMAL HIGH (ref 8–23)
CO2: 29 mmol/L (ref 22–32)
Calcium: 8.7 mg/dL — ABNORMAL LOW (ref 8.9–10.3)
Chloride: 101 mmol/L (ref 98–111)
Creatinine, Ser: 0.83 mg/dL (ref 0.44–1.00)
GFR, Estimated: >60 mL/min (ref >60)
Glucose, Bld: 132 mg/dL — ABNORMAL HIGH (ref 70–99)
Potassium: 4.3 mmol/L (ref 3.5–5.1)
Sodium: 138 mmol/L (ref 135–145)

## 2023-06-01 LAB — CBC
HCT: 34.7 % — ABNORMAL LOW (ref 36.0–46.0)
Hemoglobin: 10.6 g/dL — ABNORMAL LOW (ref 12.0–15.0)
MCH: 29.9 pg (ref 26.0–34.0)
MCHC: 30.5 g/dL (ref 30.0–36.0)
MCV: 98 fL (ref 80.0–100.0)
Platelets: 244 10*3/uL (ref 150–400)
RBC: 3.54 MIL/uL — ABNORMAL LOW (ref 3.87–5.11)
RDW: 12.8 % (ref 11.5–15.5)
WBC: 10.5 10*3/uL (ref 4.0–10.5)
nRBC: 0 % (ref 0.0–0.2)

## 2023-06-01 NOTE — Discharge Summary (Signed)
 Physician Discharge Summary  Patient ID: ALIE MOUDY MRN: [id number] DOB/AGE: September 08, 1950 73 y.o.  Admit date: 05/30/2023 Discharge date: 06/01/2023  Admission Diagnoses:  Degenerative arthritis of left knee  Discharge Diagnoses:  Principal Problem:   Degenerative arthritis of left knee Active Problems:   S/P total knee arthroplasty, left   Past Medical History:  Diagnosis Date   Anxiety    Arthritis    in neck, in knee has gel shots givin in knee once a week for 3 weeks   BV (bacterial vaginosis) 04/06/2021   /329/23 rx flagyl    Cervical high risk HPV (human papillomavirus) test positive 04/06/2021   Repeat pap in 1 year per ASCCP guidelines 5 year risk for CIN3+ 4.8%   Depression    Hypertension    Pre-diabetes     Surgeries: Procedure(s): ARTHROPLASTY, KNEE, TOTAL, USING IMAGELESS COMPUTER-ASSISTED NAVIGATION on 05/30/2023   Consultants (if any):   Discharged Condition: Improved  Hospital Course: Linda Page is an 73 y.o. female who was admitted 05/30/2023 with a diagnosis of Degenerative arthritis of left knee and went to the operating room on 05/30/2023 and underwent the above named procedures.    She was given perioperative antibiotics:  Anti-infectives (From admission, onward)    Start     Dose/Rate Route Frequency Ordered Stop   05/31/23 0600  ceFAZolin  (ANCEF ) IVPB 2g/100 mL premix        2 g 200 mL/hr over 30 Minutes Intravenous On call to O.R. 05/30/23 1215 05/31/23 0621   05/30/23 2200  ceFAZolin  (ANCEF ) IVPB 2g/100 mL premix        2 g 200 mL/hr over 30 Minutes Intravenous Every 6 hours 05/30/23 2011 05/31/23 7829       She was given sequential compression devices, early ambulation, and aspirin  for DVT prophylaxis.  POD#1 Patient ambulated 60 feet with PT in the am, she had some orthostatic hypotension with 2nd PT session. IV fluids restarted, methocarbamol  discontinued.  POD#2 Patient did well with PT. Discharged home with OPPT. Follow-up in  office in 2 weeks.   She benefited maximally from the hospital stay and there were no complications.    Recent vital signs:  Vitals:   05/31/23 2019 06/01/23 0521  BP: 137/67 (!) 105/59  Pulse: 91 91  Resp: 16 15  Temp: 98.2 F (36.8 C) 98.1 F (36.7 C)  SpO2: 93% 94%    Recent laboratory studies:  Lab Results  Component Value Date   HGB 10.6 (L) 06/01/2023   HGB 11.8 (L) 05/31/2023   HGB 12.6 05/23/2023   Lab Results  Component Value Date   WBC 10.5 06/01/2023   PLT 244 06/01/2023   Lab Results  Component Value Date   INR 1.0 03/27/2019   Lab Results  Component Value Date   NA 138 06/01/2023   K 4.3 06/01/2023   CL 101 06/01/2023   CO2 29 06/01/2023   BUN 27 (H) 06/01/2023   CREATININE 0.83 06/01/2023   GLUCOSE 132 (H) 06/01/2023     Allergies as of 06/01/2023       Reactions   Codeine Nausea And Vomiting   Other Itching   The patient stated that she was given cortisone shots in her neck whatever was used to numb the area caused her to itch.        Medication List     TAKE these medications    Acetaminophen  Extra Strength 500 MG Tabs Take 2 tablets (1,000 mg total) by mouth every 8 (  eight) hours as needed for moderate pain (pain score 4-6). What changed: when to take this   ALPRAZolam  1 MG tablet Commonly known as: XANAX  Take 5 mg by mouth 2 (two) times daily.   Aspirin  Low Dose 81 MG chewable tablet Generic drug: aspirin  Chew 1 tablet (81 mg total) by mouth 2 (two) times daily with a meal.   atorvastatin  20 MG tablet Commonly known as: LIPITOR Take 20 mg by mouth at bedtime.   diphenhydrAMINE  25 MG tablet Commonly known as: BENADRYL  Take 25 mg by mouth at bedtime as needed for allergies.   docusate sodium  100 MG capsule Commonly known as: Colace Take 1 capsule (100 mg total) by mouth 2 (two) times daily.   fexofenadine 180 MG tablet Commonly known as: ALLEGRA Take 180 mg by mouth daily as needed for allergies or rhinitis.    lisinopril  20 MG tablet Commonly known as: ZESTRIL  Take 20 mg by mouth daily.   ondansetron  4 MG tablet Commonly known as: Zofran  Take 1 tablet (4 mg total) by mouth every 8 (eight) hours as needed for nausea or vomiting.   oxyCODONE  5 MG immediate release tablet Commonly known as: Roxicodone  Take 1 tablet (5 mg total) by mouth every 4 (four) hours as needed for severe pain (pain score 7-10).   polyethylene glycol powder 17 GM/SCOOP powder Commonly known as: MiraLax  Take 17 grams dissolved in liquid by mouth daily as needed for mild constipation or moderate constipation.   senna 8.6 MG Tabs tablet Commonly known as: SENOKOT Take 2 tablets (17.2 mg total) by mouth at bedtime for 15 days.   sertraline  100 MG tablet Commonly known as: ZOLOFT  Take 150 mg by mouth at bedtime.   Vitamin D  (Ergocalciferol ) 1.25 MG (50000 UNIT) Caps capsule Commonly known as: DRISDOL Take 50,000 Units by mouth every Wednesday.               Discharge Care Instructions  (From admission, onward)           Start     Ordered   05/31/23 0000  Weight bearing as tolerated        05/31/23 0928   05/31/23 0000  Change dressing       Comments: Do not remove your dressing.   05/31/23 0928              WEIGHT BEARING   Weight bearing as tolerated with assist device (walker, cane, etc) as directed, use it as long as suggested by your surgeon or therapist, typically at least 4-6 weeks.   EXERCISES  Results after joint replacement surgery are often greatly improved when you follow the exercise, range of motion and muscle strengthening exercises prescribed by your doctor. Safety measures are also important to protect the joint from further injury. Any time any of these exercises cause you to have increased pain or swelling, decrease what you are doing until you are comfortable again and then slowly increase them. If you have problems or questions, call your caregiver or physical therapist  for advice.   Rehabilitation is important following a joint replacement. After just a few days of immobilization, the muscles of the leg can become weakened and shrink (atrophy).  These exercises are designed to build up the tone and strength of the thigh and leg muscles and to improve motion. Often times heat used for twenty to thirty minutes before working out will loosen up your tissues and help with improving the range of motion but do not use  heat for the first two weeks following surgery (sometimes heat can increase post-operative swelling).   These exercises can be done on a training (exercise) mat, on the floor, on a table or on a bed. Use whatever works the best and is most comfortable for you.    Use music or television while you are exercising so that the exercises are a pleasant break in your day. This will make your life better with the exercises acting as a break in your routine that you can look forward to.   Perform all exercises about fifteen times, three times per day or as directed.  You should exercise both the operative leg and the other leg as well.  Exercises include:   Quad Sets - Tighten up the muscle on the front of the thigh (Quad) and hold for 5-10 seconds.   Straight Leg Raises - With your knee straight (if you were given a brace, keep it on), lift the leg to 60 degrees, hold for 3 seconds, and slowly lower the leg.  Perform this exercise against resistance later as your leg gets stronger.  Leg Slides: Lying on your back, slowly slide your foot toward your buttocks, bending your knee up off the floor (only go as far as is comfortable). Then slowly slide your foot back down until your leg is flat on the floor again.  Angel Wings: Lying on your back spread your legs to the side as far apart as you can without causing discomfort.  Hamstring Strength:  Lying on your back, push your heel against the floor with your leg straight by tightening up the muscles of your buttocks.   Repeat, but this time bend your knee to a comfortable angle, and push your heel against the floor.  You may put a pillow under the heel to make it more comfortable if necessary.   A rehabilitation program following joint replacement surgery can speed recovery and prevent re-injury in the future due to weakened muscles. Contact your doctor or a physical therapist for more information on knee rehabilitation.    CONSTIPATION  Constipation is defined medically as fewer than three stools per week and severe constipation as less than one stool per week.  Even if you have a regular bowel pattern at home, your normal regimen is likely to be disrupted due to multiple reasons following surgery.  Combination of anesthesia, postoperative narcotics, change in appetite and fluid intake all can affect your bowels.   YOU MUST use at least one of the following options; they are listed in order of increasing strength to get the job done.  They are all available over the counter, and you may need to use some, POSSIBLY even all of these options:    Drink plenty of fluids (prune juice may be helpful) and high fiber foods Colace 100 mg by mouth twice a day  Senokot for constipation as directed and as needed Dulcolax (bisacodyl), take with full glass of water   Miralax  (polyethylene glycol) once or twice a day as needed.  If you have tried all these things and are unable to have a bowel movement in the first 3-4 days after surgery call either your surgeon or your primary doctor.    If you experience loose stools or diarrhea, hold the medications until you stool forms back up.  If your symptoms do not get better within 1 week or if they get worse, check with your doctor.  If you experience "the worst abdominal pain ever" or develop  nausea or vomiting, please contact the office immediately for further recommendations for treatment.   ITCHING:  If you experience itching with your medications, try taking only a single pain  pill, or even half a pain pill at a time.  You can also use Benadryl  over the counter for itching or also to help with sleep.   TED HOSE STOCKINGS:  Use stockings on both legs until for at least 2 weeks or as directed by physician office. They may be removed at night for sleeping.  MEDICATIONS:  See your medication summary on the "After Visit Summary" that nursing will review with you.  You may have some home medications which will be placed on hold until you complete the course of blood thinner medication.  It is important for you to complete the blood thinner medication as prescribed.  PRECAUTIONS:  If you experience chest pain or shortness of breath - call 911 immediately for transfer to the hospital emergency department.   If you develop a fever greater that 101 F, purulent drainage from wound, increased redness or drainage from wound, foul odor from the wound/dressing, or calf pain - CONTACT YOUR SURGEON.                                                   FOLLOW-UP APPOINTMENTS:  If you do not already have a post-op appointment, please call the office for an appointment to be seen by your surgeon.  Guidelines for how soon to be seen are listed in your "After Visit Summary", but are typically between 1-4 weeks after surgery.  OTHER INSTRUCTIONS:   Knee Replacement:  Do not place pillow under knee, focus on keeping the knee straight while resting. CPM instructions: 0-90 degrees, 2 hours in the morning, 2 hours in the afternoon, and 2 hours in the evening. Place foam block, curve side up under heel at all times except when in CPM or when walking.  DO NOT modify, tear, cut, or change the foam block in any way.   MAKE SURE YOU:  Understand these instructions.  Get help right away if you are not doing well or get worse.    Thank you for letting us  be a part of your medical care team.  It is a privilege we respect greatly.  We hope these instructions will help you stay on track for a fast and full  recovery!   Diagnostic Studies: DG Knee Left Port Result Date: 05/30/2023 CLINICAL DATA:  Degenerative arthritis of the left knee. Postoperative. EXAM: PORTABLE LEFT KNEE - 1-2 VIEW COMPARISON:  None Available. FINDINGS: Left knee total arthroplasty is present in anatomic alignment. No evidence for fracture. There is anterior soft tissue swelling and air compatible with recent surgery. IMPRESSION: Left knee total arthroplasty in anatomic alignment. Electronically Signed   By: Tyron Gallon M.D.   On: 05/30/2023 19:52    Disposition: Discharge disposition: 01-Home or Self Care       Discharge Instructions     Call MD / Call 911   Complete by: As directed    If you experience chest pain or shortness of breath, CALL 911 and be transported to the hospital emergency room.  If you develope a fever above 101 F, pus (white drainage) or increased drainage or redness at the wound, or calf pain, call your surgeon's office.  Change dressing   Complete by: As directed    Do not remove your dressing.   Constipation Prevention   Complete by: As directed    Drink plenty of fluids.  Prune juice may be helpful.  You may use a stool softener, such as Colace (over the counter) 100 mg twice a day.  Use MiraLax  (over the counter) for constipation as needed.   Diet - low sodium heart healthy   Complete by: As directed    Discharge instructions   Complete by: As directed    Elevate toes above nose. Use cryotherapy as needed for pain and swelling.   Do not put a pillow under the knee. Place it under the heel.   Complete by: As directed    Driving restrictions   Complete by: As directed    No driving for 6 weeks   Increase activity slowly as tolerated   Complete by: As directed    Lifting restrictions   Complete by: As directed    No lifting for 6 weeks   Post-operative opioid taper instructions:   Complete by: As directed    POST-OPERATIVE OPIOID TAPER INSTRUCTIONS: It is important to wean off of  your opioid medication as soon as possible. If you do not need pain medication after your surgery it is ok to stop day one. Opioids include: Codeine, Hydrocodone (Norco, Vicodin), Oxycodone (Percocet, oxycontin ) and hydromorphone  amongst others.  Long term and even short term use of opiods can cause: Increased pain response Dependence Constipation Depression Respiratory depression And more.  Withdrawal symptoms can include Flu like symptoms Nausea, vomiting And more Techniques to manage these symptoms Hydrate well Eat regular healthy meals Stay active Use relaxation techniques(deep breathing, meditating, yoga) Do Not substitute Alcohol  to help with tapering If you have been on opioids for less than two weeks and do not have pain than it is ok to stop all together.  Plan to wean off of opioids This plan should start within one week post op of your joint replacement. Maintain the same interval or time between taking each dose and first decrease the dose.  Cut the total daily intake of opioids by one tablet each day Next start to increase the time between doses. The last dose that should be eliminated is the evening dose.      TED hose   Complete by: As directed    Use stockings (TED hose) for 2 weeks on both leg(s).  You may remove them at night for sleeping.   Weight bearing as tolerated   Complete by: As directed         Follow-up Information     Harman Lightning, PA-C. Go on 06/12/2023.   Specialty: Orthopedic Surgery Why: You are scheduled for first post op appt on Tuesday June 3 at 10:30am. Contact information: [address] [place] [address] [phone]                  Signed: Harman Lightning 06/01/2023, 1:45 PM

## 2023-06-01 NOTE — Progress Notes (Signed)
 Physical Therapy Treatment Patient Details Name: Linda Page MRN: [id number] DOB: Feb 24, 1950 Today's Date: 06/01/2023   History of Present Illness Pt s/p L TKR and with hx of R TKR and cervical fusion    PT Comments  Pt feeling much better and progressing well with mobility.  Pt performed HEP with assist and with written instruction provided; up to ambulate increased distance in hall and negotiated stairs.  Pt eager for dc home this date.    If plan is discharge home, recommend the following: A little help with walking and/or transfers;A little help with bathing/dressing/bathroom;Assistance with cooking/housework;Assist for transportation;Help with stairs or ramp for entrance   Can travel by private vehicle        Equipment Recommendations  None recommended by PT    Recommendations for Other Services       Precautions / Restrictions Precautions Precautions: Knee;Fall Restrictions Weight Bearing Restrictions Per Provider Order: No Other Position/Activity Restrictions: WBAT     Mobility  Bed Mobility Overal bed mobility: Needs Assistance Bed Mobility: Supine to Sit     Supine to sit: Contact guard     General bed mobility comments: Increased time with cues for sequence and use of R LE to self assist    Transfers Overall transfer level: Needs assistance Equipment used: Rolling walker (2 wheels) Transfers: Sit to/from Stand Sit to Stand: Contact guard assist, Supervision           General transfer comment: min cues for LE management and use of UEs to self assist    Ambulation/Gait Ambulation/Gait assistance: Contact guard assist, Supervision Gait Distance (Feet): 100 Feet Assistive device: Rolling walker (2 wheels) Gait Pattern/deviations: Step-to pattern, Decreased step length - right, Decreased step length - left, Shuffle, Trunk flexed Gait velocity: decr     General Gait Details: cues for sequence, posture and position from RW   Stairs Stairs:  Yes Stairs assistance: Min assist Stair Management: No rails, Step to pattern, Forwards, With walker Number of Stairs: 2 General stair comments: single step twice with cues for sequence   Wheelchair Mobility     Tilt Bed    Modified Rankin (Stroke Patients Only)       Balance Overall balance assessment: Needs assistance Sitting-balance support: No upper extremity supported, Feet supported Sitting balance-Leahy Scale: Good     Standing balance support: No upper extremity supported Standing balance-Leahy Scale: Fair                              Hotel manager: No apparent difficulties  Cognition Arousal: Alert Behavior During Therapy: WFL for tasks assessed/performed   PT - Cognitive impairments: No apparent impairments                         Following commands: Intact      Cueing Cueing Techniques: Verbal cues  Exercises Total Joint Exercises Ankle Circles/Pumps: AROM, Both, 15 reps, Supine Quad Sets: AROM, Both, 10 reps, Supine Heel Slides: AAROM, Left, 15 reps, Supine Straight Leg Raises: AAROM, AROM, Left, 10 reps, Supine Long Arc Quad: AAROM, Left, 10 reps, Seated    General Comments        Pertinent Vitals/Pain Pain Assessment Pain Assessment: 0-10 Pain Score: 5  Pain Location: L knee Pain Descriptors / Indicators: Aching, Sore Pain Intervention(s): Limited activity within patient's tolerance, Monitored during session, Premedicated before session, Ice applied    Home Living  Prior Function            PT Goals (current goals can now be found in the care plan section) Acute Rehab PT Goals Patient Stated Goal: Regain IND PT Goal Formulation: With patient Time For Goal Achievement: 06/07/23 Potential to Achieve Goals: Good Progress towards PT goals: Progressing toward goals    Frequency    7X/week      PT Plan      Co-evaluation               AM-PAC PT "6 Clicks" Mobility   Outcome Measure  Help needed turning from your back to your side while in a flat bed without using bedrails?: A Little Help needed moving from lying on your back to sitting on the side of a flat bed without using bedrails?: A Little Help needed moving to and from a bed to a chair (including a wheelchair)?: A Little Help needed standing up from a chair using your arms (e.g., wheelchair or bedside chair)?: A Little Help needed to walk in hospital room?: A Little Help needed climbing 3-5 steps with a railing? : A Little 6 Click Score: 18    End of Session Equipment Utilized During Treatment: Gait belt Activity Tolerance: Patient tolerated treatment well Patient left: in chair;with call bell/phone within reach;with chair alarm set Nurse Communication: Mobility status PT Visit Diagnosis: Unsteadiness on feet (R26.81);Difficulty in walking, not elsewhere classified (R26.2)     Time: 1610-9604 PT Time Calculation (min) (ACUTE ONLY): 36 min  Charges:    $Gait Training: 8-22 mins $Therapeutic Exercise: 8-22 mins PT General Charges $$ ACUTE PT VISIT: 1 Visit                     Thedora Finlay PT Acute Rehabilitation Services Pager [phone] Office [phone]    Linda Page 06/01/2023, 11:40 AM

## 2023-06-01 NOTE — Plan of Care (Signed)
  Problem: Education: Goal: Knowledge of General Education information will improve Description: Including pain rating scale, medication(s)/side effects and non-pharmacologic comfort measures Outcome: Progressing   Problem: Health Behavior/Discharge Planning: Goal: Ability to manage health-related needs will improve Outcome: Adequate for Discharge   Problem: Clinical Measurements: Goal: Ability to maintain clinical measurements within normal limits will improve Outcome: Progressing Goal: Will remain free from infection Outcome: Progressing Goal: Diagnostic test results will improve Outcome: Progressing Goal: Respiratory complications will improve Outcome: Progressing Goal: Cardiovascular complication will be avoided Outcome: Progressing   Problem: Activity: Goal: Risk for activity intolerance will decrease Outcome: Adequate for Discharge   Problem: Nutrition: Goal: Adequate nutrition will be maintained Outcome: Completed/Met   Problem: Coping: Goal: Level of anxiety will decrease Outcome: Progressing   Problem: Elimination: Goal: Will not experience complications related to bowel motility Outcome: Progressing Goal: Will not experience complications related to urinary retention Outcome: Completed/Met   Problem: Pain Managment: Goal: General experience of comfort will improve and/or be controlled Outcome: Progressing   Problem: Safety: Goal: Ability to remain free from injury will improve Outcome: Progressing   Problem: Skin Integrity: Goal: Risk for impaired skin integrity will decrease Outcome: Adequate for Discharge   Problem: Education: Goal: Knowledge of the prescribed therapeutic regimen will improve Outcome: Progressing   Problem: Activity: Goal: Ability to avoid complications of mobility impairment will improve Outcome: Progressing Goal: Range of joint motion will improve Outcome: Adequate for Discharge   Problem: Clinical Measurements: Goal:  Postoperative complications will be avoided or minimized Outcome: Progressing   Problem: Pain Management: Goal: Pain level will decrease with appropriate interventions Outcome: Progressing   Problem: Skin Integrity: Goal: Will show signs of wound healing Outcome: Progressing

## 2023-06-01 NOTE — Progress Notes (Signed)
    Subjective:  Patient reports pain as mild.  Denies N/V/CP/SOB/Abd pain today. She denies any tingling or numbness in LE bilaterally. She states she feels much better today. Only had tylenol  this morning. She is eager for d/c home.   Objective:   VITALS:   Vitals:   05/31/23 1436 05/31/23 1915 05/31/23 2019 06/01/23 0521  BP:   137/67 (!) 105/59  Pulse:   91 91  Resp:   16 15  Temp:   98.2 F (36.8 C) 98.1 F (36.7 C)  TempSrc:   Oral Oral  SpO2: 97%  93% 94%  Weight:  102.1 kg    Height:  5\' 4"  (1.626 m)      NAD Neurologically intact ABD soft Neurovascular intact Sensation intact distally Intact pulses distally Dorsiflexion/Plantar flexion intact Incision: dressing C/D/I No cellulitis present Compartment soft   Lab Results  Component Value Date   WBC 10.5 06/01/2023   HGB 10.6 (L) 06/01/2023   HCT 34.7 (L) 06/01/2023   MCV 98.0 06/01/2023   PLT 244 06/01/2023   BMET    Component Value Date/Time   NA 138 06/01/2023 0313   K 4.3 06/01/2023 0313   CL 101 06/01/2023 0313   CO2 29 06/01/2023 0313   GLUCOSE 132 (H) 06/01/2023 0313   BUN 27 (H) 06/01/2023 0313   CREATININE 0.83 06/01/2023 0313   CALCIUM  8.7 (L) 06/01/2023 0313   GFRNONAA >60 06/01/2023 0313     Assessment/Plan: 2 Days Post-Op   Principal Problem:   Degenerative arthritis of left knee Active Problems:   S/P total knee arthroplasty, left   WBAT with walker DVT ppx: Aspirin , SCDs, TEDS PO pain control PT/OT: Kept overnight due to orthostatic hypotension yesterday. She did well with PT today.  Dispo:  - D/c home with OPPT. Follow-up in office in 2 weeks.   Harman Lightning 06/01/2023, 9:06 AM  [facility]  [place] [address], [place], [place] [address] Phone: [phone] [email or web address] Facebook  [employer]

## 2023-06-01 NOTE — Progress Notes (Signed)
 Discharge paperwork printed and reviewed with pt. Medication sent to home with daughter yesterday. Pt has no concerns. Daughter coming to pick up for transportation.

## 2023-06-02 ENCOUNTER — Encounter (HOSPITAL_COMMUNITY): Payer: Self-pay | Admitting: Orthopedic Surgery

## 2023-06-02 NOTE — Anesthesia Postprocedure Evaluation (Signed)
 Anesthesia Post Note  Patient: Linda Page  Procedure(s) Performed: ARTHROPLASTY, KNEE, TOTAL, USING IMAGELESS COMPUTER-ASSISTED NAVIGATION (Left: Knee)     Patient location during evaluation: PACU Anesthesia Type: Regional, MAC and Spinal Level of consciousness: awake and alert Pain management: pain level controlled Vital Signs Assessment: post-procedure vital signs reviewed and stable Respiratory status: spontaneous breathing, nonlabored ventilation and respiratory function stable Cardiovascular status: blood pressure returned to baseline and stable Postop Assessment: no apparent nausea or vomiting Anesthetic complications: no   No notable events documented.                  Torell Minder

## 2023-06-05 DIAGNOSIS — M25662 Stiffness of left knee, not elsewhere classified: Secondary | ICD-10-CM | POA: Diagnosis not present

## 2023-06-05 DIAGNOSIS — M25562 Pain in left knee: Secondary | ICD-10-CM | POA: Diagnosis not present

## 2023-06-05 NOTE — Transitions of Care (Post Inpatient/ED Visit) (Signed)
 06/05/2023  Patient ID: Linda Page, female   DOB: 1950-10-12, 73 y.o.   MRN: [id number]   Chart Review for transitions of care.  See [employer] for documentation.  Shataria Crist J. Thalya Fouche RN, MSN [facility], [facility] RN Care Manager Direct Dial: [phone]  Fax: [phone] Website: [email or web address]

## 2023-06-07 DIAGNOSIS — M25662 Stiffness of left knee, not elsewhere classified: Secondary | ICD-10-CM | POA: Diagnosis not present

## 2023-06-07 DIAGNOSIS — M25562 Pain in left knee: Secondary | ICD-10-CM | POA: Diagnosis not present

## 2023-06-07 NOTE — Transitions of Care (Post Inpatient/ED Visit) (Signed)
 06/07/2023  Patient ID: Linda Page, female   DOB: May 23, 1950, 73 y.o.   MRN: [id number]  Chart Review for transitions of care.  See [employer] for  documentation.  Josy Peaden J. Zerick Prevette RN, MSN [facility], [facility] RN Care Manager Direct Dial: [phone]  Fax: [phone] Website: [email or web address]

## 2023-06-11 DIAGNOSIS — M25562 Pain in left knee: Secondary | ICD-10-CM | POA: Diagnosis not present

## 2023-06-11 DIAGNOSIS — M25662 Stiffness of left knee, not elsewhere classified: Secondary | ICD-10-CM | POA: Diagnosis not present

## 2023-06-12 DIAGNOSIS — Z471 Aftercare following joint replacement surgery: Secondary | ICD-10-CM | POA: Diagnosis not present

## 2023-06-12 DIAGNOSIS — Z96652 Presence of left artificial knee joint: Secondary | ICD-10-CM | POA: Diagnosis not present

## 2023-06-13 DIAGNOSIS — M25562 Pain in left knee: Secondary | ICD-10-CM | POA: Diagnosis not present

## 2023-06-13 DIAGNOSIS — M25662 Stiffness of left knee, not elsewhere classified: Secondary | ICD-10-CM | POA: Diagnosis not present

## 2023-06-15 ENCOUNTER — Encounter (HOSPITAL_COMMUNITY): Payer: Self-pay

## 2023-06-15 ENCOUNTER — Emergency Department (HOSPITAL_COMMUNITY)

## 2023-06-15 ENCOUNTER — Other Ambulatory Visit: Payer: Self-pay

## 2023-06-15 ENCOUNTER — Emergency Department (HOSPITAL_COMMUNITY)
Admission: EM | Admit: 2023-06-15 | Discharge: 2023-06-15 | Disposition: A | Discharge status: Home / Self Care | Attending: Emergency Medicine | Admitting: Emergency Medicine

## 2023-06-15 DIAGNOSIS — T8130XA Disruption of wound, unspecified, initial encounter: Secondary | ICD-10-CM

## 2023-06-15 DIAGNOSIS — T8131XA Disruption of external operation (surgical) wound, not elsewhere classified, initial encounter: Secondary | ICD-10-CM | POA: Diagnosis not present

## 2023-06-15 DIAGNOSIS — Z7982 Long term (current) use of aspirin: Secondary | ICD-10-CM | POA: Insufficient documentation

## 2023-06-15 DIAGNOSIS — S81012A Laceration without foreign body, left knee, initial encounter: Secondary | ICD-10-CM | POA: Insufficient documentation

## 2023-06-15 DIAGNOSIS — M25462 Effusion, left knee: Secondary | ICD-10-CM | POA: Diagnosis not present

## 2023-06-15 DIAGNOSIS — W010XXA Fall on same level from slipping, tripping and stumbling without subsequent striking against object, initial encounter: Secondary | ICD-10-CM | POA: Diagnosis not present

## 2023-06-15 DIAGNOSIS — M7989 Other specified soft tissue disorders: Secondary | ICD-10-CM | POA: Insufficient documentation

## 2023-06-15 DIAGNOSIS — Z96652 Presence of left artificial knee joint: Secondary | ICD-10-CM | POA: Diagnosis not present

## 2023-06-15 DIAGNOSIS — W19XXXA Unspecified fall, initial encounter: Secondary | ICD-10-CM

## 2023-06-15 DIAGNOSIS — R58 Hemorrhage, not elsewhere classified: Secondary | ICD-10-CM | POA: Diagnosis not present

## 2023-06-15 DIAGNOSIS — Z23 Encounter for immunization: Secondary | ICD-10-CM | POA: Diagnosis not present

## 2023-06-15 DIAGNOSIS — R000 Tachycardia, unspecified: Secondary | ICD-10-CM | POA: Diagnosis not present

## 2023-06-15 DIAGNOSIS — R600 Localized edema: Secondary | ICD-10-CM | POA: Diagnosis not present

## 2023-06-15 DIAGNOSIS — M799 Soft tissue disorder, unspecified: Secondary | ICD-10-CM | POA: Diagnosis not present

## 2023-06-15 DIAGNOSIS — M25562 Pain in left knee: Secondary | ICD-10-CM | POA: Diagnosis not present

## 2023-06-15 DIAGNOSIS — M25662 Stiffness of left knee, not elsewhere classified: Secondary | ICD-10-CM | POA: Diagnosis not present

## 2023-06-15 DIAGNOSIS — S8992XA Unspecified injury of left lower leg, initial encounter: Secondary | ICD-10-CM | POA: Diagnosis present

## 2023-06-15 MED ORDER — CEPHALEXIN 500 MG PO CAPS: 500.0000 mg | ORAL_CAPSULE | Freq: Two times a day (BID) | ORAL | 0 refills | Status: AC

## 2023-06-15 MED ORDER — LIDOCAINE-EPINEPHRINE (PF) 1 %-1:200000 IJ SOLN
20.0000 mL | Freq: Once | INTRAMUSCULAR | Status: AC
  Administered 2023-06-15: 20 mL via INTRADERMAL
  Filled 2023-06-15: qty 30

## 2023-06-15 MED ORDER — TETANUS-DIPHTH-ACELL PERTUSSIS 5-2.5-18.5 LF-MCG/0.5 IM SUSY
0.5000 mL | PREFILLED_SYRINGE | Freq: Once | INTRAMUSCULAR | Status: AC
  Administered 2023-06-15: 0.5 mL via INTRAMUSCULAR
  Filled 2023-06-15: qty 0.5

## 2023-06-15 MED ORDER — ACETAMINOPHEN 500 MG PO TABS: 1000.0000 mg | ORAL_TABLET | Freq: Once | ORAL | Status: DC

## 2023-06-15 NOTE — ED Provider Notes (Signed)
 [place] EMERGENCY DEPARTMENT AT [facility] Provider Note   CSN: [id number] Arrival date & time: 06/15/23  1548     History  Chief Complaint  Patient presents with   Coleridge Davenport    SHARNIECE GIBBON is a 73 y.o. female.  73 year old female with recent left knee replacement on 05/30/2023 who presents to the emergency department with left knee injury.  Patient reports that she was outside and she tripped and fell.  The wound from her knee replacement dehisced and has had some bleeding since then.  Not on blood thinners.       Home Medications Prior to Admission medications   Medication Sig Start Date End Date Taking? Authorizing Provider  cephALEXin  (KEFLEX ) 500 MG capsule Take 1 capsule (500 mg total) by mouth 2 (two) times daily for 5 days. 06/15/23 06/20/23 Yes Ninetta Basket, MD  acetaminophen  (TYLENOL ) 500 MG tablet Take 2 tablets (1,000 mg total) by mouth every 8 (eight) hours as needed for moderate pain (pain score 4-6). 05/31/23   Harman Lightning, PA-C  ALPRAZolam  (XANAX ) 1 MG tablet Take 5 mg by mouth 2 (two) times daily.    [provider]  aspirin  (ASPIRIN  CHILDRENS) 81 MG chewable tablet Chew 1 tablet (81 mg total) by mouth 2 (two) times daily with a meal. 05/31/23 07/15/23  Hill, Philippa Bray, PA-C  atorvastatin  (LIPITOR) 20 MG tablet Take 20 mg by mouth at bedtime.     [provider]  diphenhydrAMINE  (BENADRYL ) 25 MG tablet Take 25 mg by mouth at bedtime as needed for allergies.    [provider]  docusate sodium  (COLACE) 100 MG capsule Take 1 capsule (100 mg total) by mouth 2 (two) times daily. 05/31/23 06/30/23  Harman Lightning, PA-C  fexofenadine (ALLEGRA) 180 MG tablet Take 180 mg by mouth daily as needed for allergies or rhinitis.    [provider]  lisinopril  (ZESTRIL ) 20 MG tablet Take 20 mg by mouth daily.    [provider]  ondansetron  (ZOFRAN ) 4 MG tablet Take 1 tablet (4 mg total) by mouth every 8 (eight) hours as needed  for nausea or vomiting. 05/31/23 05/30/24  Harman Lightning, PA-C  oxyCODONE  (ROXICODONE ) 5 MG immediate release tablet Take 1 tablet (5 mg total) by mouth every 4 (four) hours as needed for severe pain (pain score 7-10). 05/31/23   Trixie Furnace S, PA-C  polyethylene glycol powder (MIRALAX ) 17 GM/SCOOP powder Take 17 grams dissolved in liquid by mouth daily as needed for mild constipation or moderate constipation. 05/31/23 06/30/23  Harman Lightning, PA-C  sertraline  (ZOLOFT ) 100 MG tablet Take 150 mg by mouth at bedtime.     [provider]  Vitamin D , Ergocalciferol , (DRISDOL) 1.25 MG (50000 UNIT) CAPS capsule Take 50,000 Units by mouth every Wednesday.    [provider]      Allergies    Codeine and Other    Review of Systems   Review of Systems  Physical Exam Updated Vital Signs BP 135/81   Pulse 98   Temp 98 F (36.7 C) (Oral)   Resp 17   Ht 5\' 4"  (1.626 m)   Wt 102.1 kg   SpO2 95%   BMI 38.62 kg/m  Physical Exam Musculoskeletal:     Comments: 10 cm laceration above the left knee.  Full range of motion.  Explored through full depth of the wound I do not see any signs of joint capsule violation.     ED  Results / Procedures / Treatments   Labs (all labs ordered are listed, but only abnormal results are displayed) Labs Reviewed - No data to display  EKG None  Radiology CT Knee Left Wo Contrast Result Date: 06/15/2023 CLINICAL DATA:  Fall and trauma to the left knee.  Pain. EXAM: CT OF THE LEFT KNEE WITHOUT CONTRAST TECHNIQUE: Multidetector CT imaging of the left knee was performed according to the standard protocol. Multiplanar CT image reconstructions were also generated. RADIATION DOSE REDUCTION: This exam was performed according to the departmental dose-optimization program which includes automated exposure control, adjustment of the mA and/or kV according to patient size and/or use of iterative reconstruction technique. COMPARISON:  Left knee radiograph dated  06/15/2023. FINDINGS: Bones/Joint/Cartilage There is a total left knee arthroplasty. The arthroplasty components appear intact and in anatomic alignment. No acute fracture or dislocation. Evaluation however is limited due to streak artifact caused by arthroplasty. There is a moderate suprapatellar effusion. Ligaments Suboptimally assessed by CT. Muscles and Tendons No acute findings. Soft tissues Postsurgical changes and anterior knee surgical incision. Mild thickening of the skin and subcutaneous edema anterior to the knee and leg. No fluid collection. IMPRESSION: 1. No acute fracture or dislocation. 2. Total left knee arthroplasty. 3. Moderate suprapatellar effusion. Electronically Signed   By: Angus Bark M.D.   On: 06/15/2023 17:48   DG Knee Complete 4 Views Left Result Date: 06/15/2023 CLINICAL DATA:  Left knee pain status post fall EXAM: LEFT KNEE - COMPLETE 4 VIEW COMPARISON:  Left knee radiograph dated 05/30/2023 FINDINGS: Postsurgical changes of left knee arthroplasty. Hardware appears intact and well seated. No abnormal surrounding lucency. No acute fracture or dislocation. Small joint effusion remains. Persistent overlying soft tissue edema and few foci of subcutaneous lucencies. IMPRESSION: 1. Postsurgical changes of left knee arthroplasty without evidence of hardware complication. 2. No acute fracture or dislocation. 3. Persistent small joint effusion and overlying soft tissue edema and emphysema, likely postsurgical. Electronically Signed   By: Limin  Xu M.D.   On: 06/15/2023 17:05    Procedures .Laceration Repair  Date/Time: 06/15/2023 6:29 PM  Performed by: Ninetta Basket, MD Authorized by: Ninetta Basket, MD   Consent:    Consent obtained:  Verbal   Consent given by:  Patient   Risks discussed:  Infection, pain, retained foreign body, need for additional repair and poor cosmetic result Anesthesia:    Anesthesia method:  Local infiltration   Local anesthetic:  Lidocaine   1% WITH epi Laceration details:    Location:  Leg   Leg location: L knee.   Length (cm):  10 Exploration:    Imaging obtained: x-ray     Imaging outcome: foreign body not noted   Treatment:    Area cleansed with:  Soap and water    Amount of cleaning:  Standard   Irrigation solution:  Sterile saline   Irrigation method:  Pressure wash Skin repair:    Repair method:  Staples   Number of staples:  10 Approximation:    Approximation:  Close Repair type:    Repair type:  Intermediate Post-procedure details:    Dressing:  Non-adherent dressing   Procedure completion:  Tolerated well, no immediate complications     Medications Ordered in ED Medications  Tdap (BOOSTRIX ) injection 0.5 mL (0.5 mLs Intramuscular Given 06/15/23 1643)  lidocaine -EPINEPHrine  (PF) (XYLOCAINE -EPINEPHrine ) 1 %-1:200000 (PF) injection 20 mL (20 mLs Intradermal Given 06/15/23 1833)    ED Course/ Medical Decision Making/ A&P  Medical Decision Making Amount and/or Complexity of Data Reviewed Radiology: ordered.  Risk Prescription drug management.   73 year old female with a history of left knee replacement recently who presents to the emergency department with fall, knee pain, and wound dehiscence  Initial Ddx:  Fracture, open fracture, wound dehiscence, contamination, infection, traumatic arthrotomy  MDM/Course:  Patient presents to the emergency department after fall and knee pain.  No other injuries as a result of the fall.  Was mechanical in nature.  Unfortunately has wound dehiscence from her surgery.  Underwent a CT of the knee which did not show evidence of traumatic arthrotomy or other traumatic injury.  Wound was explored through full depth and no signs of joint capsule involvement there either.  Wound was repaired with 10 staples.  Given a knee immobilizer to prevent wound dehiscence again for the next few days.  Given Keflex  to prevent infection given her age and  size of the wound.  Will have her follow-up with her outpatient orthopedist.  This patient presents to the ED for concern of complaints listed in HPI, this involves an extensive number of treatment options, and is a complaint that carries with it a high risk of complications and morbidity. Disposition including potential need for admission considered.   Dispo: DC Home. Return precautions discussed including, but not limited to, those listed in the AVS. Allowed pt time to ask questions which were answered fully prior to dc.  Additional history obtained from daughter Records reviewed Outpatient Clinic Notes I independently reviewed the following imaging with scope of interpretation limited to determining acute life threatening conditions related to emergency care: Extremity x-ray(s) and agree with the radiologist interpretation with the following exceptions: none I have reviewed the patients home medications and made adjustments as needed Social Determinants of health:  Geriatric  Portions of this note were generated with Scientist, clinical (histocompatibility and immunogenetics). Dictation errors may occur despite best attempts at proofreading.     Final Clinical Impression(s) / ED Diagnoses Final diagnoses:  Wound dehiscence  Fall, initial encounter    Rx / DC Orders ED Discharge Orders          Ordered    cephALEXin  (KEFLEX ) 500 MG capsule  2 times daily        06/15/23 1826              Ninetta Basket, MD 06/16/23 1144

## 2023-06-15 NOTE — ED Triage Notes (Signed)
 Pt had knee replacement surgery 2 weeks ago and today she was outside and slipped over water . Per EMS, the left knee incision came open (about 5cm). Pt has hx of HTN and takes ASA 81mg  daily.

## 2023-06-15 NOTE — Discharge Instructions (Signed)
 You were seen for your fall and cut in the emergency department.   At home, please take the antibiotics we have prescribed you to prevent infection.  Continue aspirin  to prevent blood clot.  Please also wear the knee immobilizer when you are walking around to try and prevent the staples from tearing out. They will need to be removed in 10 days to 2 weeks.    Check your MyChart online for the results of any tests that had not resulted by the time you left the emergency department.   Follow-up with your surgeon as soon as possible for repeat visit.  Return immediately to the emergency department if you experience any of the following: Opening of your wound, drainage from your wound, fevers, or any other concerning symptoms.    Thank you for visiting our Emergency Department. It was a pleasure taking care of you today.

## 2023-06-18 DIAGNOSIS — M25362 Other instability, left knee: Secondary | ICD-10-CM | POA: Diagnosis not present

## 2023-06-18 DIAGNOSIS — Z96652 Presence of left artificial knee joint: Secondary | ICD-10-CM | POA: Diagnosis not present

## 2023-06-18 DIAGNOSIS — Z471 Aftercare following joint replacement surgery: Secondary | ICD-10-CM | POA: Diagnosis not present

## 2023-07-04 ENCOUNTER — Other Ambulatory Visit (HOSPITAL_COMMUNITY): Payer: Self-pay

## 2023-07-16 DIAGNOSIS — Z471 Aftercare following joint replacement surgery: Secondary | ICD-10-CM | POA: Diagnosis not present

## 2023-07-16 DIAGNOSIS — Z96652 Presence of left artificial knee joint: Secondary | ICD-10-CM | POA: Diagnosis not present

## 2023-07-20 ENCOUNTER — Telehealth: Payer: Self-pay

## 2023-07-20 NOTE — Telephone Encounter (Signed)
 Up to date on meds, next review in August

## 2023-08-01 ENCOUNTER — Other Ambulatory Visit (HOSPITAL_COMMUNITY): Payer: Self-pay | Admitting: Adult Health

## 2023-08-01 DIAGNOSIS — D241 Benign neoplasm of right breast: Secondary | ICD-10-CM

## 2023-08-07 ENCOUNTER — Ambulatory Visit (HOSPITAL_COMMUNITY)
Admission: RE | Admit: 2023-08-07 | Discharge: 2023-08-07 | Disposition: A | Discharge status: Home / Self Care | Source: Ambulatory Visit | Attending: Adult Health | Admitting: Adult Health

## 2023-08-07 ENCOUNTER — Ambulatory Visit: Payer: Self-pay | Admitting: Adult Health

## 2023-08-07 ENCOUNTER — Ambulatory Visit (HOSPITAL_COMMUNITY)
Admission: RE | Admit: 2023-08-07 | Discharge: 2023-08-07 | Disposition: A | Discharge status: Home / Self Care | Source: Ambulatory Visit | Attending: Nurse Practitioner | Admitting: Nurse Practitioner

## 2023-08-07 ENCOUNTER — Encounter (HOSPITAL_COMMUNITY): Payer: Self-pay

## 2023-08-07 DIAGNOSIS — N6311 Unspecified lump in the right breast, upper outer quadrant: Secondary | ICD-10-CM | POA: Diagnosis not present

## 2023-08-07 DIAGNOSIS — Z13820 Encounter for screening for osteoporosis: Secondary | ICD-10-CM | POA: Insufficient documentation

## 2023-08-07 DIAGNOSIS — N6011 Diffuse cystic mastopathy of right breast: Secondary | ICD-10-CM | POA: Diagnosis not present

## 2023-08-07 DIAGNOSIS — R92333 Mammographic heterogeneous density, bilateral breasts: Secondary | ICD-10-CM | POA: Diagnosis not present

## 2023-08-07 DIAGNOSIS — Z780 Asymptomatic menopausal state: Secondary | ICD-10-CM | POA: Insufficient documentation

## 2023-08-07 DIAGNOSIS — D241 Benign neoplasm of right breast: Secondary | ICD-10-CM | POA: Insufficient documentation

## 2023-08-07 DIAGNOSIS — M85852 Other specified disorders of bone density and structure, left thigh: Secondary | ICD-10-CM | POA: Diagnosis not present

## 2023-08-07 DIAGNOSIS — R921 Mammographic calcification found on diagnostic imaging of breast: Secondary | ICD-10-CM | POA: Diagnosis not present

## 2023-08-08 DIAGNOSIS — M25662 Stiffness of left knee, not elsewhere classified: Secondary | ICD-10-CM | POA: Diagnosis not present

## 2023-08-08 DIAGNOSIS — M25562 Pain in left knee: Secondary | ICD-10-CM | POA: Diagnosis not present

## 2023-08-10 DIAGNOSIS — M25562 Pain in left knee: Secondary | ICD-10-CM | POA: Diagnosis not present

## 2023-08-10 DIAGNOSIS — M25662 Stiffness of left knee, not elsewhere classified: Secondary | ICD-10-CM | POA: Diagnosis not present

## 2023-08-17 ENCOUNTER — Ambulatory Visit (HOSPITAL_COMMUNITY)
Admission: RE | Admit: 2023-08-17 | Discharge: 2023-08-17 | Disposition: A | Discharge status: Home / Self Care | Source: Ambulatory Visit | Attending: Nurse Practitioner | Admitting: Nurse Practitioner

## 2023-08-17 DIAGNOSIS — Z8249 Family history of ischemic heart disease and other diseases of the circulatory system: Secondary | ICD-10-CM | POA: Diagnosis not present

## 2023-08-17 DIAGNOSIS — Z136 Encounter for screening for cardiovascular disorders: Secondary | ICD-10-CM | POA: Diagnosis not present

## 2023-08-20 DIAGNOSIS — L820 Inflamed seborrheic keratosis: Secondary | ICD-10-CM | POA: Diagnosis not present

## 2023-08-20 DIAGNOSIS — L821 Other seborrheic keratosis: Secondary | ICD-10-CM | POA: Diagnosis not present

## 2023-08-23 DIAGNOSIS — M25562 Pain in left knee: Secondary | ICD-10-CM | POA: Diagnosis not present

## 2023-08-23 DIAGNOSIS — R809 Proteinuria, unspecified: Secondary | ICD-10-CM | POA: Diagnosis not present

## 2023-08-23 DIAGNOSIS — R7303 Prediabetes: Secondary | ICD-10-CM | POA: Diagnosis not present

## 2023-08-23 DIAGNOSIS — E782 Mixed hyperlipidemia: Secondary | ICD-10-CM | POA: Diagnosis not present

## 2023-08-23 DIAGNOSIS — M25662 Stiffness of left knee, not elsewhere classified: Secondary | ICD-10-CM | POA: Diagnosis not present

## 2023-08-24 DIAGNOSIS — M25562 Pain in left knee: Secondary | ICD-10-CM | POA: Diagnosis not present

## 2023-08-24 DIAGNOSIS — M25662 Stiffness of left knee, not elsewhere classified: Secondary | ICD-10-CM | POA: Diagnosis not present

## 2023-08-28 DIAGNOSIS — M25662 Stiffness of left knee, not elsewhere classified: Secondary | ICD-10-CM | POA: Diagnosis not present

## 2023-08-28 DIAGNOSIS — M25562 Pain in left knee: Secondary | ICD-10-CM | POA: Diagnosis not present

## 2023-08-30 DIAGNOSIS — R809 Proteinuria, unspecified: Secondary | ICD-10-CM | POA: Diagnosis not present

## 2023-08-30 DIAGNOSIS — I10 Essential (primary) hypertension: Secondary | ICD-10-CM | POA: Diagnosis not present

## 2023-08-30 DIAGNOSIS — R059 Cough, unspecified: Secondary | ICD-10-CM | POA: Diagnosis not present

## 2023-08-30 DIAGNOSIS — R7303 Prediabetes: Secondary | ICD-10-CM | POA: Diagnosis not present

## 2023-08-30 DIAGNOSIS — N189 Chronic kidney disease, unspecified: Secondary | ICD-10-CM | POA: Diagnosis not present

## 2023-08-30 DIAGNOSIS — E782 Mixed hyperlipidemia: Secondary | ICD-10-CM | POA: Diagnosis not present

## 2023-08-30 DIAGNOSIS — F419 Anxiety disorder, unspecified: Secondary | ICD-10-CM | POA: Diagnosis not present

## 2023-08-30 DIAGNOSIS — M25662 Stiffness of left knee, not elsewhere classified: Secondary | ICD-10-CM | POA: Diagnosis not present

## 2023-08-30 DIAGNOSIS — I129 Hypertensive chronic kidney disease with stage 1 through stage 4 chronic kidney disease, or unspecified chronic kidney disease: Secondary | ICD-10-CM | POA: Diagnosis not present

## 2023-08-30 DIAGNOSIS — Z23 Encounter for immunization: Secondary | ICD-10-CM | POA: Diagnosis not present

## 2023-08-30 DIAGNOSIS — Z8249 Family history of ischemic heart disease and other diseases of the circulatory system: Secondary | ICD-10-CM | POA: Diagnosis not present

## 2023-08-30 DIAGNOSIS — M25562 Pain in left knee: Secondary | ICD-10-CM | POA: Diagnosis not present

## 2023-09-04 DIAGNOSIS — M25662 Stiffness of left knee, not elsewhere classified: Secondary | ICD-10-CM | POA: Diagnosis not present

## 2023-09-04 DIAGNOSIS — M25562 Pain in left knee: Secondary | ICD-10-CM | POA: Diagnosis not present

## 2023-09-09 IMAGING — MG MM DIGITAL SCREENING BILAT W/ TOMO AND CAD
8 series · 8 of 24 positions shown · non-contrast
Comparison: None.

CLINICAL DATA: Screening.

EXAM:
DIGITAL SCREENING BILATERAL MAMMOGRAM WITH TOMOSYNTHESIS AND CAD
TECHNIQUE: Bilateral screening digital craniocaudal and mediolateral oblique
mammograms were obtained. Bilateral screening digital breast
tomosynthesis was performed. The images were evaluated with
computer-aided detection.

[R MLO synth-2D]
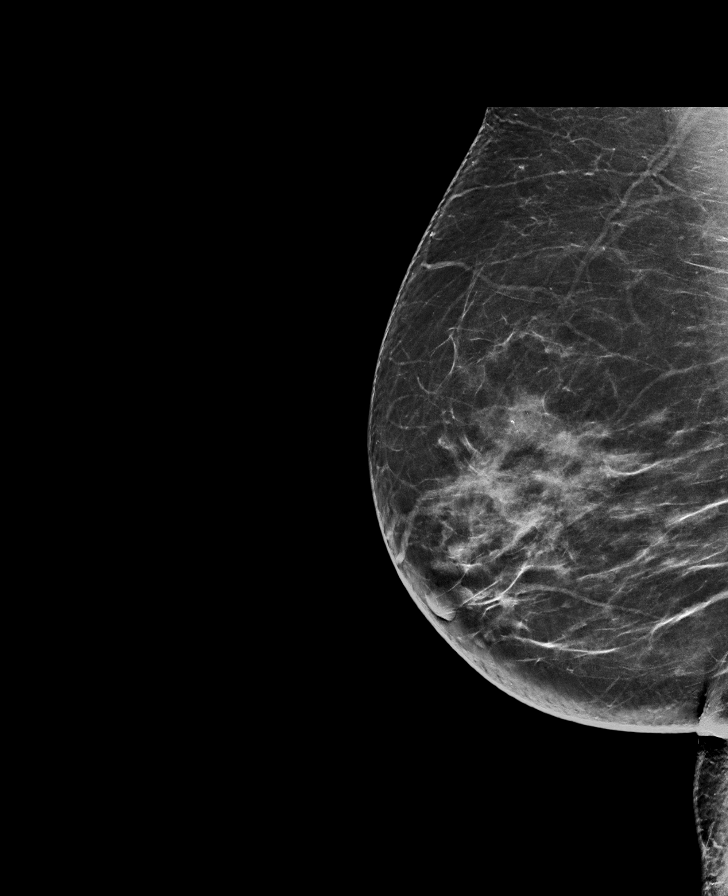

[L CC synth-2D]
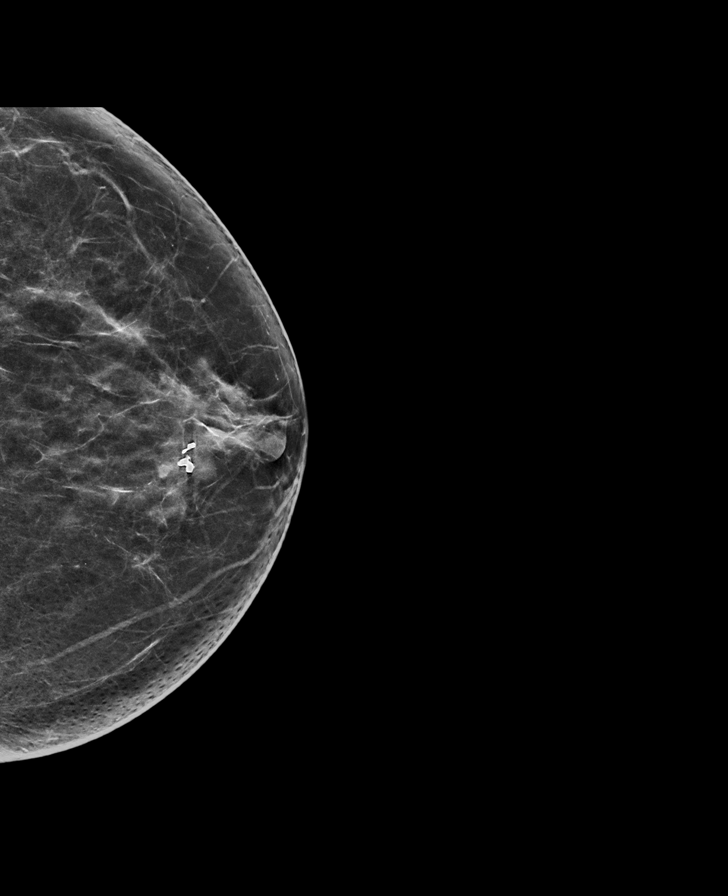

[R CC synth-2D]
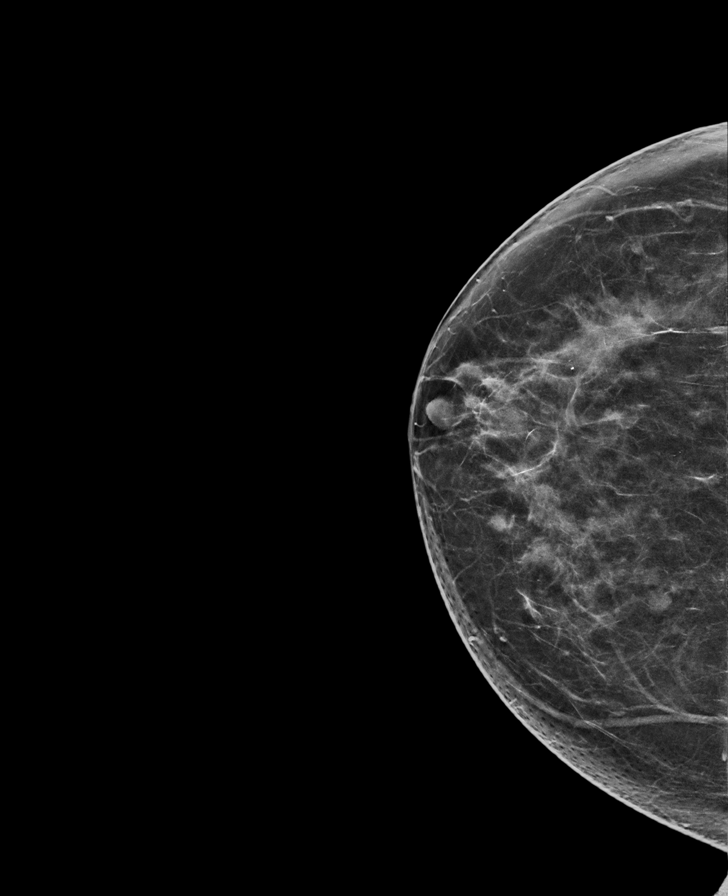

[L MLO synth-2D]
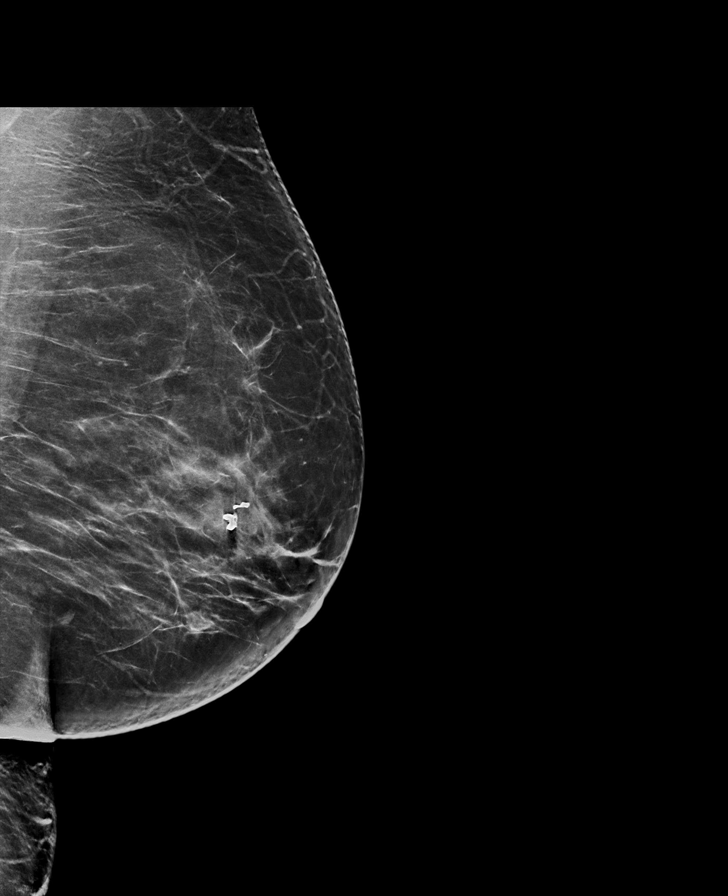

[R CC tomo · tomo slice 36/71.0]
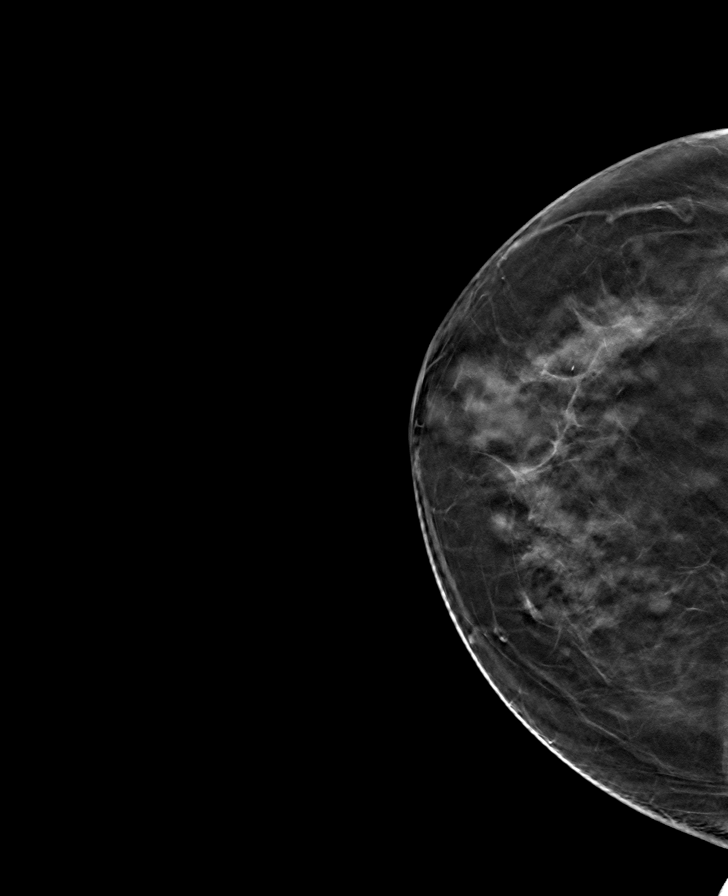

[L MLO tomo · tomo slice 41/82.0]
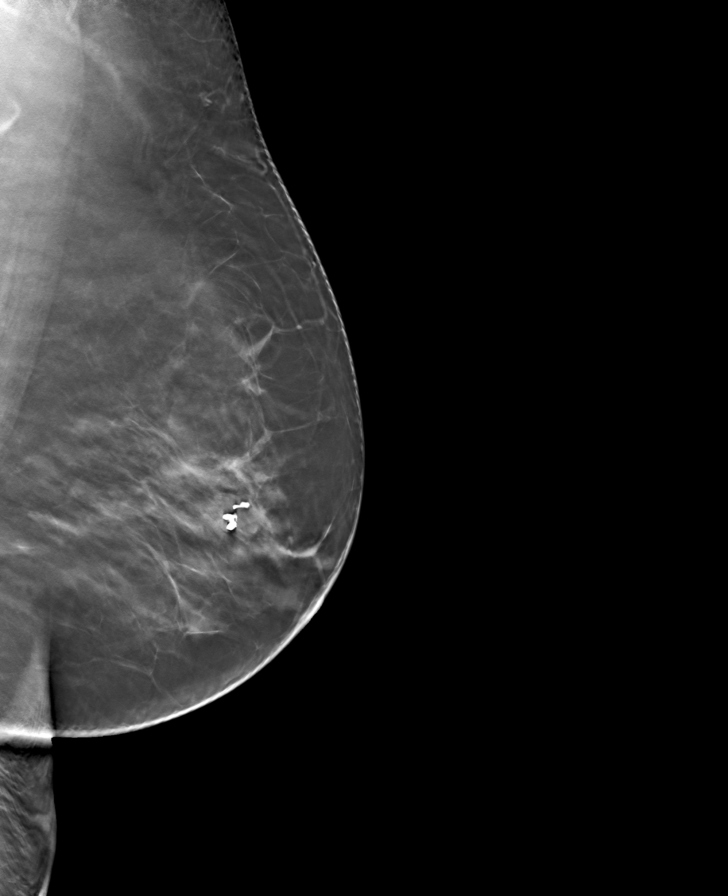

[R MLO tomo · tomo slice 39/76.0]
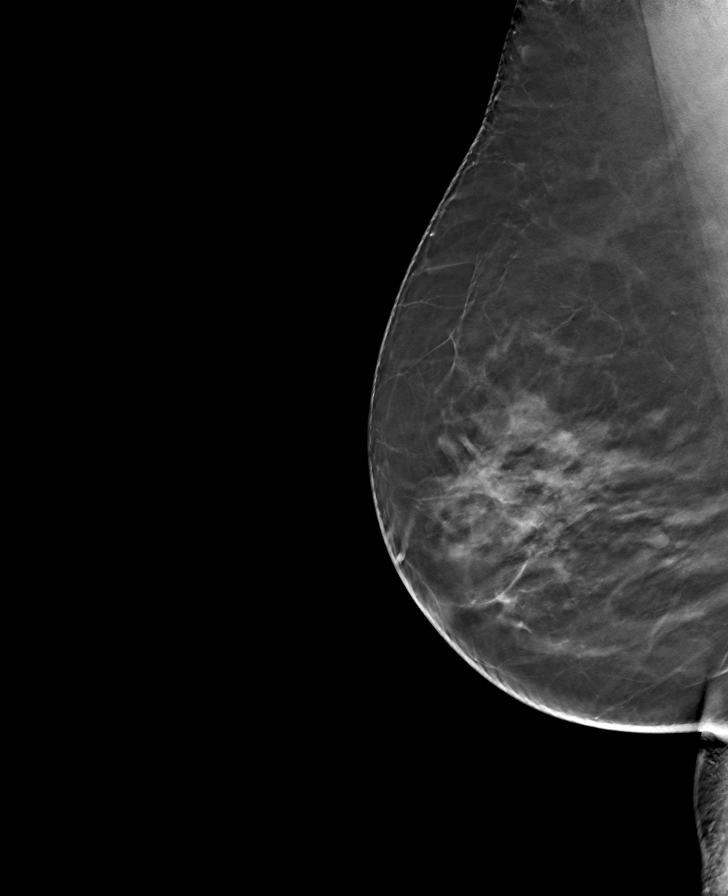

[L CC tomo · tomo slice 37/73.0]
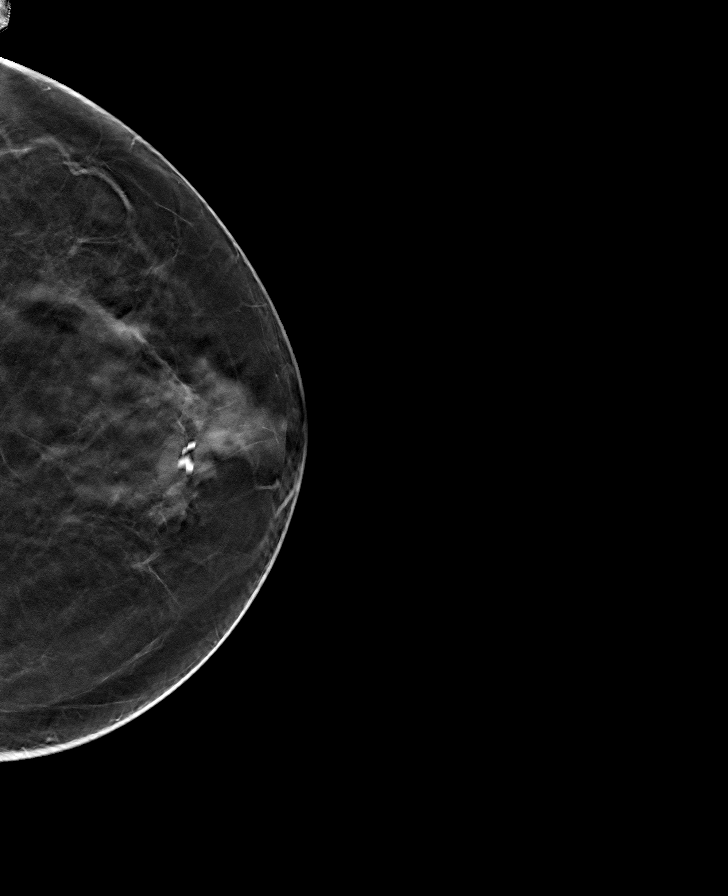

[8 of 24 positions shown; findings below may reference images not displayed]

ACR Breast Density Category c: The breast tissue is heterogeneously
dense, which may obscure small masses.
FINDINGS: In the right breast, a possible mass as well as 2 adjacent possible
asymmetries warrants further evaluation. In the left breast, no
findings suspicious for malignancy.
IMPRESSION: Further evaluation is suggested for a possible mass as well as 2
adjacent possible asymmetries in the right breast.

RECOMMENDATION:
Diagnostic mammogram and possibly ultrasound of the right breast.
(Code:[id number]-K-44Q)

The patient will be contacted regarding the findings, and additional
imaging will be scheduled.

BI-RADS CATEGORY  0: Incomplete. Need additional imaging evaluation
and/or prior mammograms for comparison.

## 2023-10-09 ENCOUNTER — Encounter: Payer: Self-pay | Admitting: Adult Health

## 2023-10-09 ENCOUNTER — Ambulatory Visit: Admitting: Adult Health

## 2023-10-09 VITALS — BP 134/84 | HR 88 | Ht 64.0 in | Wt 219.0 lb

## 2023-10-09 DIAGNOSIS — Z01419 Encounter for gynecological examination (general) (routine) without abnormal findings: Secondary | ICD-10-CM | POA: Diagnosis not present

## 2023-10-09 DIAGNOSIS — Z1331 Encounter for screening for depression: Secondary | ICD-10-CM | POA: Diagnosis not present

## 2023-10-09 DIAGNOSIS — N816 Rectocele: Secondary | ICD-10-CM

## 2023-10-09 DIAGNOSIS — Z1211 Encounter for screening for malignant neoplasm of colon: Secondary | ICD-10-CM

## 2023-10-09 DIAGNOSIS — I10 Essential (primary) hypertension: Secondary | ICD-10-CM

## 2023-10-09 LAB — HEMOCCULT GUIAC POC 1CARD (OFFICE): Fecal Occult Blood, POC: NEGATIVE

## 2023-10-09 NOTE — Progress Notes (Signed)
 Patient ID: Linda Page, female   DOB: Feb 15, 1950, 73 y.o.   MRN: [id number] History of Present Illness: Linda Page is a 73 year old white female, PM, widowed, in for a well woman gyn exam.     Component Value Date/Time   DIAGPAP  08/22/2022 1208    - Negative for intraepithelial lesion or malignancy (NILM)   DIAGPAP  03/31/2021 1035    - Negative for intraepithelial lesion or malignancy (NILM)   HPVHIGH Negative 08/22/2022 1208   HPVHIGH Positive (A) 03/31/2021 1035   ADEQPAP  08/22/2022 1208    Satisfactory for evaluation; transformation zone component PRESENT.   ADEQPAP  03/31/2021 1035    Satisfactory for evaluation; transformation zone component ABSENT.    PCP is Dr Shona Current Medications, Allergies, Past Medical History, Past Surgical History, Family History and Social History were reviewed in [employer] electronic medical record.     Review of Systems: Patient denies any headaches, hearing loss, fatigue, blurred vision, shortness of breath, chest pain, abdominal pain, problems with bowel movements, urination, or intercourse(not active). No mood swings, has had knee bilateral knee replacement. Denies any vaginal bleeding   Physical Exam:BP 134/84 (BP Location: Right Arm, Patient Position: Sitting, Cuff Size: Normal)   Pulse 88   Ht 5' 4 (1.626 m)   Wt 219 lb (99.3 kg)   BMI 37.59 kg/m   General:  Well developed, well nourished, no acute distress Skin:  Warm and dry Neck:  Midline trachea, normal thyroid, good ROM, no lymphadenopathy, no carotid bruits heard  Lungs; Clear to auscultation bilaterally Breast:  No dominant palpable mass, retraction, or nipple discharge Cardiovascular: Regular rate and rhythm Abdomen:  Soft, non tender, no hepatosplenomegaly Pelvic:  External genitalia is normal in appearance, no lesions.  The vagina is pale and atrophic. Urethra has no lesions or masses. The cervix is smooth.  Uterus is felt to be normal size, shape, and contour.  No  adnexal masses or tenderness noted.Bladder is non tender, no masses felt. Rectal: Good sphincter tone, no polyps, or hemorrhoids felt.  Hemoccult negative.+rectocele Extremities/musculoskeletal:  No swelling or varicosities noted, no clubbing or cyanosis Psych:  No mood changes, alert and cooperative,seems happy AA is 0 Fall risk is moderate    10/09/2023   11:52 AM 08/22/2022   11:53 AM 03/31/2021   10:25 AM  Depression screen PHQ 2/9  Decreased Interest 0 0 0  Down, Depressed, Hopeless 0 0 0  PHQ - 2 Score 0 0 0  Altered sleeping 0 0 0  Tired, decreased energy 0 0 0  Change in appetite 0 0 0  Feeling bad or failure about yourself  0 0 0  Trouble concentrating 0 0 0  Moving slowly or fidgety/restless 0 0 0  Suicidal thoughts 0 0 0  PHQ-9 Score 0 0 0       10/09/2023   11:52 AM 08/22/2022   11:53 AM 03/31/2021   10:25 AM  GAD 7 : Generalized Anxiety Score  Nervous, Anxious, on Edge 0 0 0  Control/stop worrying 0 0 0  Worry too much - different things 0 0 0  Trouble relaxing 0 0 0  Restless 0 0 0  Easily annoyed or irritable 0 0 0  Afraid - awful might happen 0 0 0  Total GAD 7 Score 0 0 0     Examination chaperoned by Aleck RN  Impression and plan:  1. 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 Encounter for screening fecal occult blood testing Hemoccult was negative   3. Hypertension, unspecified type  Continue BP meds and follow up with PCP  4. Rectocele Gave medical explainer #4

## 2023-10-14 IMAGING — CT CT ABD-PELV W/ CM
2 of 5 series · 16 of 46 positions shown, 18 images · IV contrast (Omnipaque or Isovue)
Comparison: 03/02/2021

CLINICAL DATA: LEFT upper quadrant abdominal pain, swallowed a
today

EXAM:
CT ABDOMEN AND PELVIS WITH CONTRAST
TECHNIQUE: Multidetector CT imaging of the abdomen and pelvis was performed
using the standard protocol following bolus administration of
intravenous contrast.

[Series 2: axial st · axial · 0.84mm/px · z∈[+1072,+1517]mm · 13 of 101 slices shown, 15 images]
[im 6/101  soft-tissue]
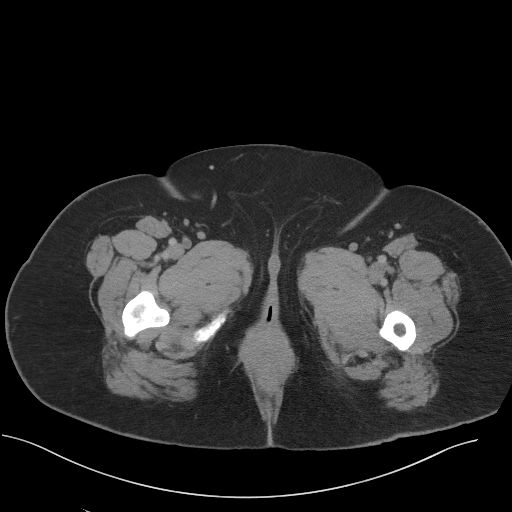
[im 6/101  bone]
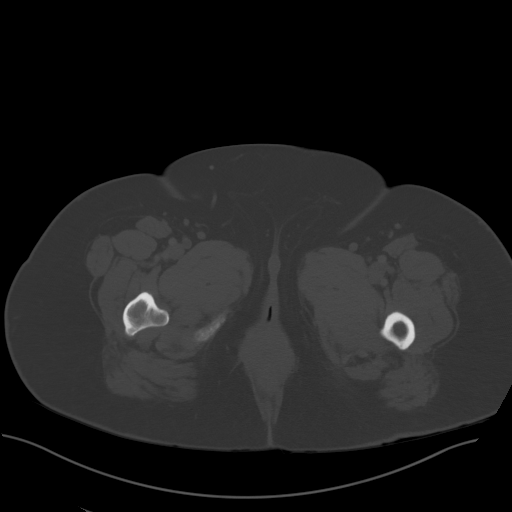
[im 12/101  soft-tissue]
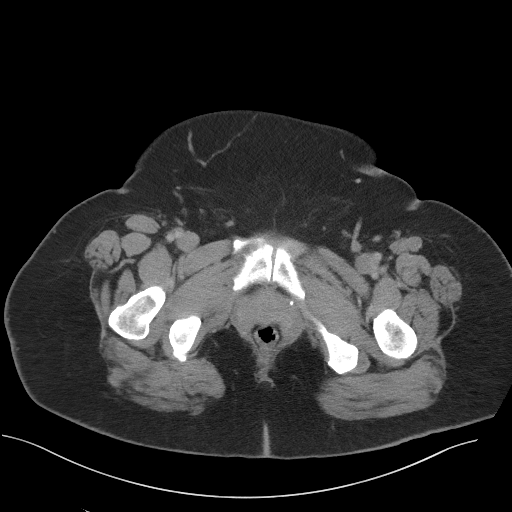
[im 23/101  soft-tissue]
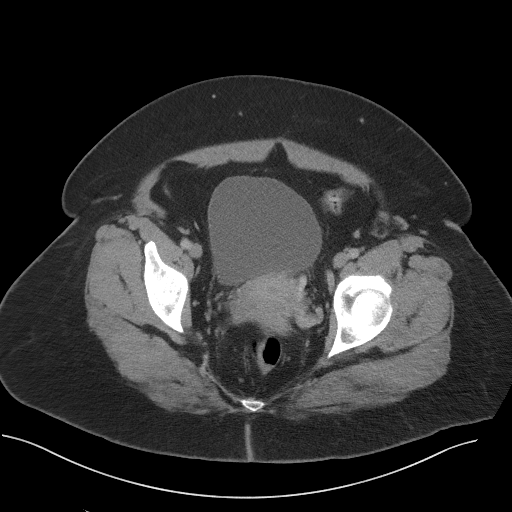
[im 28/101  soft-tissue]
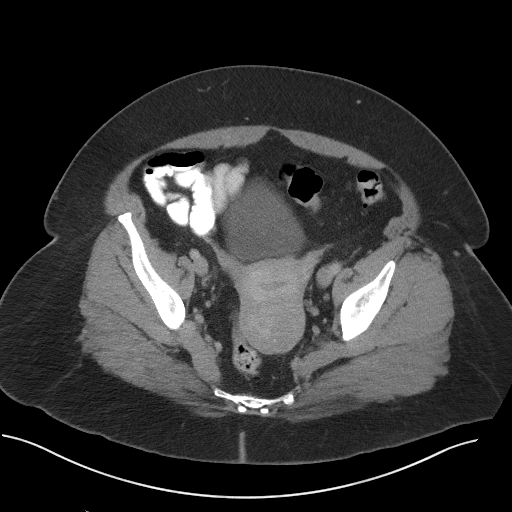
[im 34/101  soft-tissue]
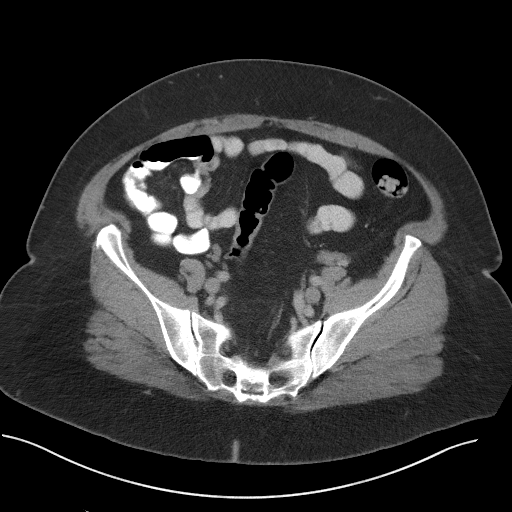
[im 45/101  soft-tissue]
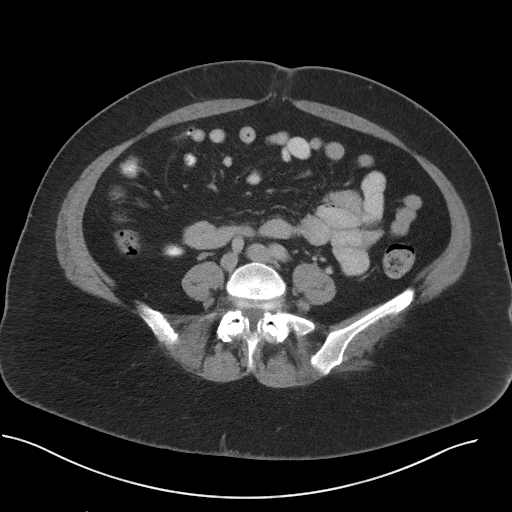
[im 51/101  soft-tissue]
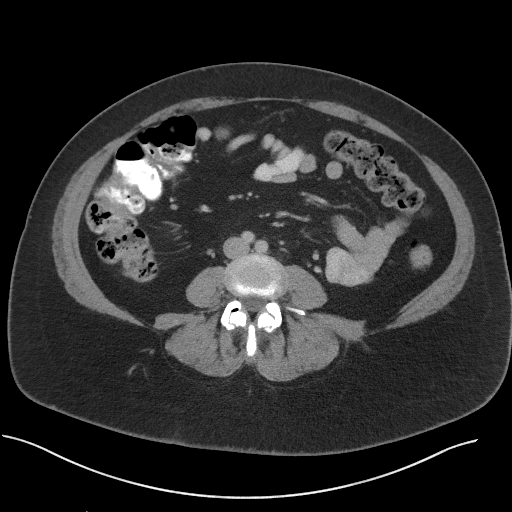
[im 56/101  soft-tissue]
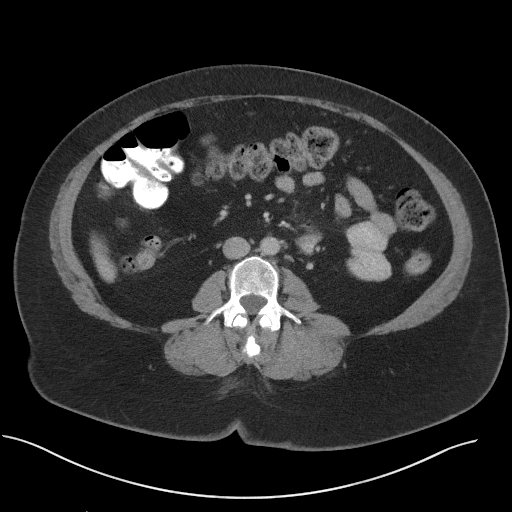
[im 67/101  soft-tissue]
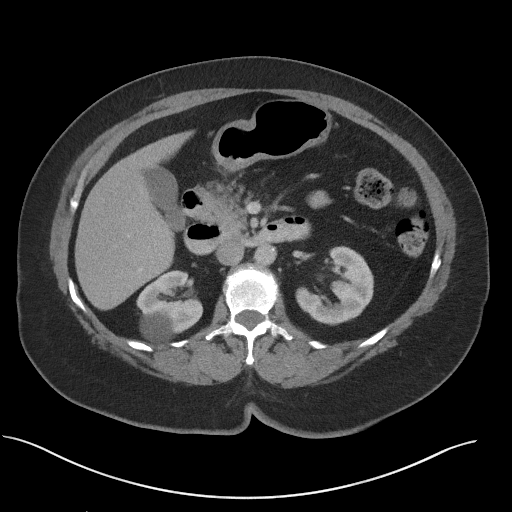
[im 67/101  bone]
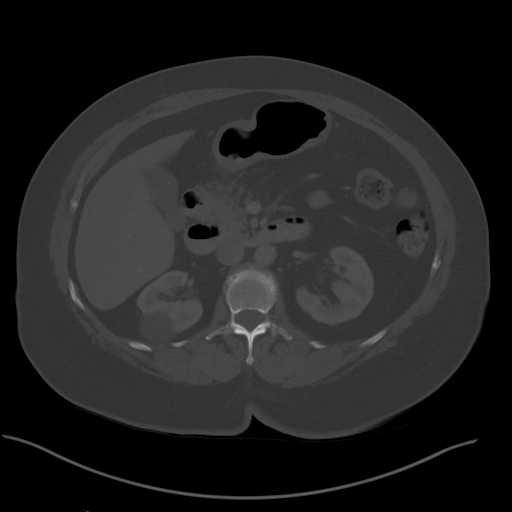
[im 73/101  soft-tissue]
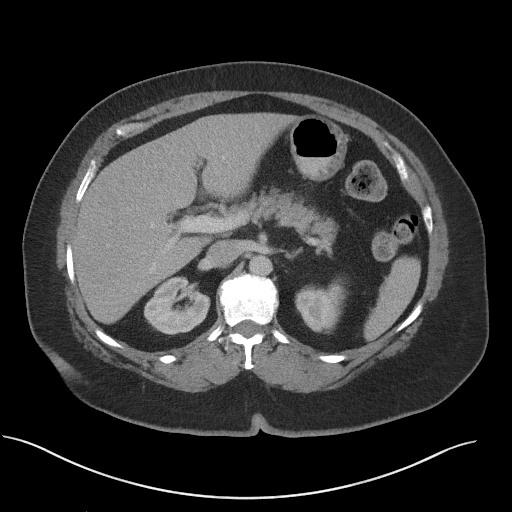
[im 78/101  soft-tissue]
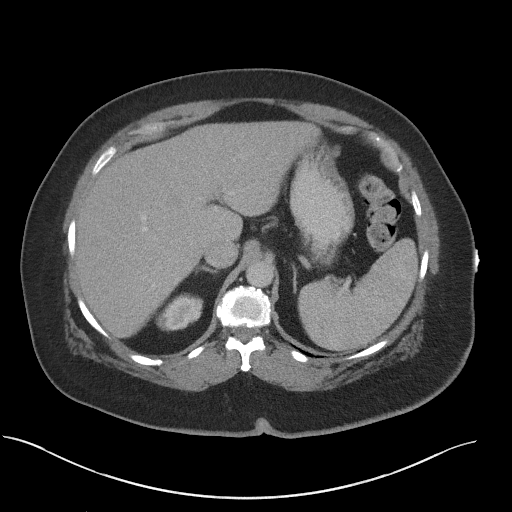
[im 89/101  soft-tissue]
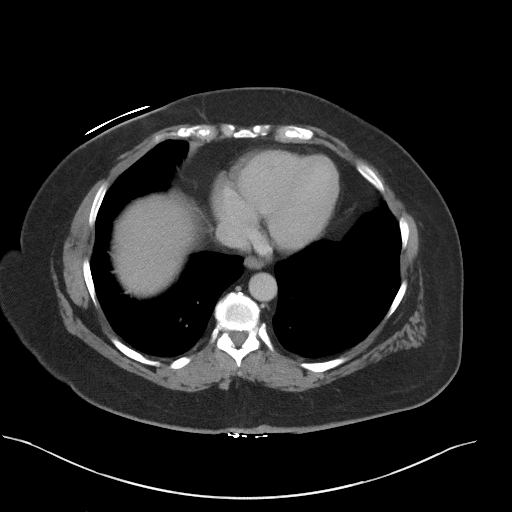
[im 95/101  soft-tissue]
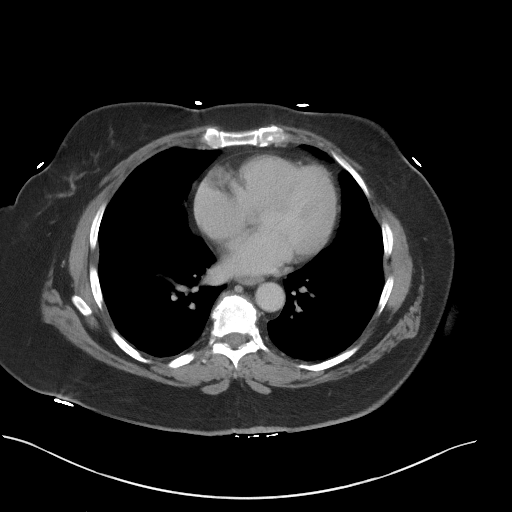

[Series 6: coronal st · coronal · 0.87mm/px · 3 of 117 slices shown]
[im 39/117  soft-tissue]
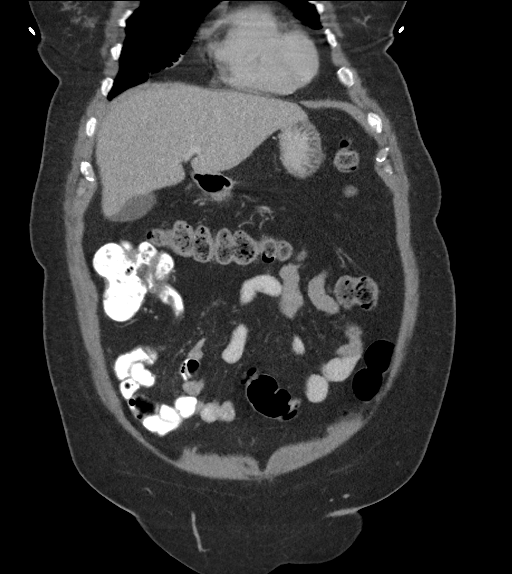
[im 52/117  soft-tissue]
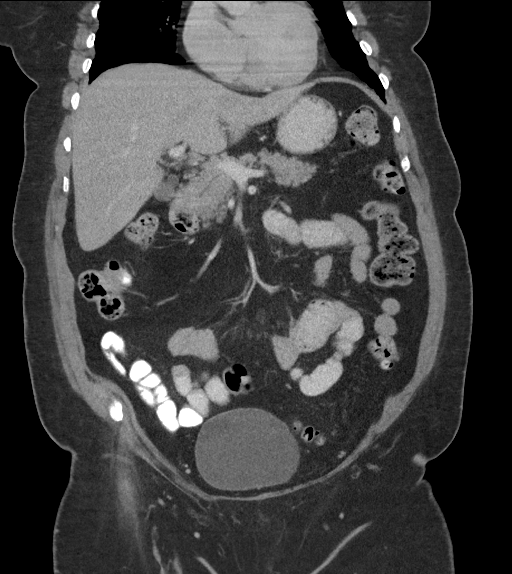
[im 65/117  soft-tissue]
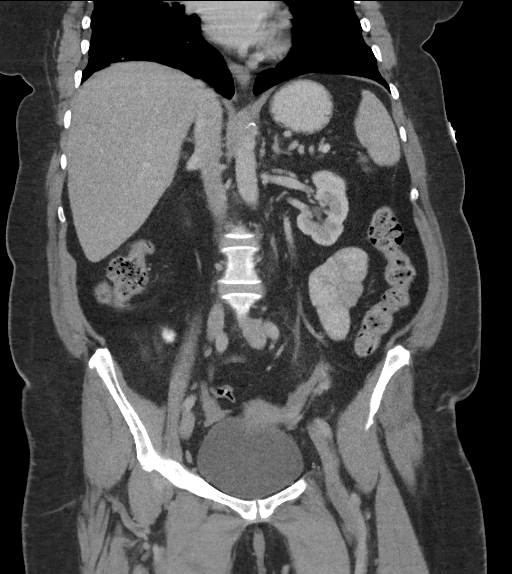

[16 of 46 positions shown; findings below may reference images not displayed]

RADIATION DOSE REDUCTION: This exam was performed according to the
departmental dose-optimization program which includes automated
exposure control, adjustment of the mA and/or kV according to
patient size and/or use of iterative reconstruction technique.

CONTRAST:  100mL OMNIPAQUE IOHEXOL 300 MG/ML SOLN IV. Dilute oral
contrast.
FINDINGS: Lower chest: Minimal dependent atelectasis posterior lower lobes.
Remaining lung bases clear.

Hepatobiliary: Gallbladder and liver normal appearance

Pancreas: Normal appearance

Spleen: Normal appearance

Adrenals/Urinary Tract: 3.6 cm RIGHT renal cyst unchanged; no
follow-up imaging recommended. Adrenal glands, kidneys, ureters, and
bladder otherwise normal appearance.

Stomach/Bowel: Question mild wall thickening of distal gastric
antrum versus artifact from underdistention, more prominent on
delayed images. Duodenal bulb and sweep normal appearance. Mild
sigmoid diverticulosis without evidence of diverticulitis. Normal
appendix. Large and small bowel loops otherwise normal appearance.

Vascular/Lymphatic: Vascular structures patent. Aorta normal caliber
with minimal atherosclerotic calcification. No adenopathy.

Reproductive: Exophytic leiomyoma posterior mid uterus 4.3 x 4.4 x
5.4 cm containing a few calcifications. Ovaries unremarkable.

Other: Small umbilical hernia containing fat. No free air or free
fluid. No inflammatory process.

Musculoskeletal: No osseous abnormalities.
IMPRESSION: Mild sigmoid diverticulosis without evidence of diverticulitis.

Small umbilical hernia containing fat.

Exophytic posterior mid uterine leiomyoma 4.3 x 4.4 x 5.4 cm.

Question mild wall thickening of distal gastric antrum versus
artifact from underdistention, not present on prior study of
03/02/2021; recommend correlation with symptoms and consider
follow-up upper GI exam or endoscopy to evaluate.

No additional intra-abdominal or intrapelvic abnormalities.

Aortic Atherosclerosis (G4U0B-[id number].C).

## 2023-11-12 DIAGNOSIS — H43393 Other vitreous opacities, bilateral: Secondary | ICD-10-CM | POA: Diagnosis not present

## 2023-11-30 DIAGNOSIS — R809 Proteinuria, unspecified: Secondary | ICD-10-CM | POA: Diagnosis not present

## 2023-11-30 DIAGNOSIS — E782 Mixed hyperlipidemia: Secondary | ICD-10-CM | POA: Diagnosis not present

## 2023-11-30 DIAGNOSIS — E559 Vitamin D deficiency, unspecified: Secondary | ICD-10-CM | POA: Diagnosis not present

## 2023-11-30 DIAGNOSIS — R7303 Prediabetes: Secondary | ICD-10-CM | POA: Diagnosis not present

## 2023-12-10 DIAGNOSIS — Z23 Encounter for immunization: Secondary | ICD-10-CM | POA: Diagnosis not present

## 2023-12-10 DIAGNOSIS — Z0001 Encounter for general adult medical examination with abnormal findings: Secondary | ICD-10-CM | POA: Diagnosis not present

## 2023-12-10 DIAGNOSIS — F411 Generalized anxiety disorder: Secondary | ICD-10-CM | POA: Diagnosis not present

## 2023-12-10 DIAGNOSIS — N189 Chronic kidney disease, unspecified: Secondary | ICD-10-CM | POA: Diagnosis not present

## 2023-12-10 DIAGNOSIS — E782 Mixed hyperlipidemia: Secondary | ICD-10-CM | POA: Diagnosis not present

## 2023-12-10 DIAGNOSIS — R7303 Prediabetes: Secondary | ICD-10-CM | POA: Diagnosis not present

## 2023-12-10 DIAGNOSIS — I10 Essential (primary) hypertension: Secondary | ICD-10-CM | POA: Diagnosis not present

## 2023-12-10 DIAGNOSIS — I129 Hypertensive chronic kidney disease with stage 1 through stage 4 chronic kidney disease, or unspecified chronic kidney disease: Secondary | ICD-10-CM | POA: Diagnosis not present

## 2023-12-10 DIAGNOSIS — Z0000 Encounter for general adult medical examination without abnormal findings: Secondary | ICD-10-CM | POA: Diagnosis not present

## 2023-12-12 DIAGNOSIS — M67442 Ganglion, left hand: Secondary | ICD-10-CM | POA: Diagnosis not present

## 2023-12-12 DIAGNOSIS — M79645 Pain in left finger(s): Secondary | ICD-10-CM | POA: Diagnosis not present

## 2023-12-12 DIAGNOSIS — M79642 Pain in left hand: Secondary | ICD-10-CM | POA: Diagnosis not present

## 2023-12-12 DIAGNOSIS — M19042 Primary osteoarthritis, left hand: Secondary | ICD-10-CM | POA: Diagnosis not present

## 2023-12-12 DIAGNOSIS — M1812 Unilateral primary osteoarthritis of first carpometacarpal joint, left hand: Secondary | ICD-10-CM | POA: Diagnosis not present
